# Patient Record
Sex: Male | Born: 1960 | Race: White | Hispanic: No | Marital: Married | Smoking: Never smoker
Health system: Southern US, Community
[De-identification: ages and names within clinical notes are randomized; demographics above are authoritative.]

## PROBLEM LIST (undated history)

## (undated) DIAGNOSIS — I77810 Thoracic aortic ectasia: Secondary | ICD-10-CM

## (undated) DIAGNOSIS — I219 Acute myocardial infarction, unspecified: Secondary | ICD-10-CM

## (undated) DIAGNOSIS — Z8616 Personal history of COVID-19: Secondary | ICD-10-CM

## (undated) DIAGNOSIS — I469 Cardiac arrest, cause unspecified: Secondary | ICD-10-CM

## (undated) DIAGNOSIS — I2510 Atherosclerotic heart disease of native coronary artery without angina pectoris: Secondary | ICD-10-CM

## (undated) DIAGNOSIS — I10 Essential (primary) hypertension: Secondary | ICD-10-CM

## (undated) DIAGNOSIS — E785 Hyperlipidemia, unspecified: Secondary | ICD-10-CM

## (undated) DIAGNOSIS — I255 Ischemic cardiomyopathy: Secondary | ICD-10-CM

## (undated) DIAGNOSIS — I4901 Ventricular fibrillation: Secondary | ICD-10-CM

## (undated) DIAGNOSIS — E119 Type 2 diabetes mellitus without complications: Secondary | ICD-10-CM

## (undated) HISTORY — DX: Personal history of COVID-19: Z86.16

## (undated) HISTORY — DX: Type 2 diabetes mellitus without complications: E11.9

## (undated) HISTORY — DX: Essential (primary) hypertension: I10

## (undated) HISTORY — DX: Thoracic aortic ectasia: I77.810

## (undated) HISTORY — PX: APPENDECTOMY: SHX54

## (undated) HISTORY — DX: Hyperlipidemia, unspecified: E78.5

## (undated) HISTORY — PX: CARDIAC CATHETERIZATION: SHX172

---

## 2012-07-08 ENCOUNTER — Inpatient Hospital Stay (HOSPITAL_COMMUNITY): Payer: Medicaid Other

## 2012-07-08 ENCOUNTER — Encounter (HOSPITAL_COMMUNITY)
Admission: EM | Disposition: A | Discharge status: Home / Self Care | Payer: Self-pay | Source: Home / Self Care | Attending: Cardiovascular Disease

## 2012-07-08 ENCOUNTER — Inpatient Hospital Stay (HOSPITAL_COMMUNITY)
Admission: EM | Admit: 2012-07-08 | Discharge: 2012-07-12 | DRG: 246 | Disposition: A | Discharge status: Home / Self Care | Payer: Medicaid Other | Attending: Cardiovascular Disease | Admitting: Cardiovascular Disease

## 2012-07-08 ENCOUNTER — Encounter (HOSPITAL_COMMUNITY): Payer: Self-pay | Admitting: Physician Assistant

## 2012-07-08 ENCOUNTER — Ambulatory Visit (HOSPITAL_COMMUNITY): Admit: 2012-07-08 | Payer: Self-pay | Admitting: Cardiovascular Disease

## 2012-07-08 ENCOUNTER — Emergency Department (HOSPITAL_COMMUNITY): Payer: Medicaid Other

## 2012-07-08 DIAGNOSIS — I2589 Other forms of chronic ischemic heart disease: Secondary | ICD-10-CM | POA: Diagnosis present

## 2012-07-08 DIAGNOSIS — I4901 Ventricular fibrillation: Secondary | ICD-10-CM

## 2012-07-08 DIAGNOSIS — I219 Acute myocardial infarction, unspecified: Secondary | ICD-10-CM

## 2012-07-08 DIAGNOSIS — I4729 Other ventricular tachycardia: Secondary | ICD-10-CM | POA: Diagnosis present

## 2012-07-08 DIAGNOSIS — I2109 ST elevation (STEMI) myocardial infarction involving other coronary artery of anterior wall: Secondary | ICD-10-CM

## 2012-07-08 DIAGNOSIS — Z8249 Family history of ischemic heart disease and other diseases of the circulatory system: Secondary | ICD-10-CM

## 2012-07-08 DIAGNOSIS — I2582 Chronic total occlusion of coronary artery: Secondary | ICD-10-CM | POA: Diagnosis present

## 2012-07-08 DIAGNOSIS — I255 Ischemic cardiomyopathy: Secondary | ICD-10-CM

## 2012-07-08 DIAGNOSIS — E785 Hyperlipidemia, unspecified: Secondary | ICD-10-CM | POA: Diagnosis present

## 2012-07-08 DIAGNOSIS — I472 Ventricular tachycardia, unspecified: Secondary | ICD-10-CM | POA: Diagnosis present

## 2012-07-08 DIAGNOSIS — Z955 Presence of coronary angioplasty implant and graft: Secondary | ICD-10-CM

## 2012-07-08 DIAGNOSIS — I2510 Atherosclerotic heart disease of native coronary artery without angina pectoris: Secondary | ICD-10-CM | POA: Diagnosis present

## 2012-07-08 DIAGNOSIS — Z9089 Acquired absence of other organs: Secondary | ICD-10-CM

## 2012-07-08 DIAGNOSIS — R031 Nonspecific low blood-pressure reading: Secondary | ICD-10-CM | POA: Diagnosis present

## 2012-07-08 DIAGNOSIS — I25119 Atherosclerotic heart disease of native coronary artery with unspecified angina pectoris: Secondary | ICD-10-CM

## 2012-07-08 HISTORY — DX: Atherosclerotic heart disease of native coronary artery without angina pectoris: I25.10

## 2012-07-08 HISTORY — PX: PERCUTANEOUS CORONARY STENT INTERVENTION (PCI-S): SHX5485

## 2012-07-08 HISTORY — DX: Ischemic cardiomyopathy: I25.5

## 2012-07-08 HISTORY — PX: LEFT HEART CATHETERIZATION WITH CORONARY ANGIOGRAM: SHX5451

## 2012-07-08 HISTORY — DX: Ventricular fibrillation: I49.01

## 2012-07-08 LAB — BASIC METABOLIC PANEL
BUN: 14 mg/dL (ref 6–23)
CO2: 19 mEq/L (ref 19–32)
Calcium: 9.4 mg/dL (ref 8.4–10.5)
Chloride: 104 mEq/L (ref 96–112)
Creatinine, Ser: 0.94 mg/dL (ref 0.50–1.35)
GFR calc Af Amer: >90 mL/min (ref >90)
GFR calc non Af Amer: >90 mL/min (ref >90)
Glucose, Bld: 234 mg/dL — ABNORMAL HIGH (ref 70–99)
Potassium: 3.1 mEq/L — ABNORMAL LOW (ref 3.5–5.1)
Sodium: 142 mEq/L (ref 135–145)

## 2012-07-08 LAB — POCT I-STAT, CHEM 8
Calcium, Ion: 1.12 mmol/L (ref 1.12–1.23)
HCT: 43 % (ref 39.0–52.0)
TCO2: 18 mmol/L (ref 0–100)

## 2012-07-08 LAB — DIFFERENTIAL
Basophils Absolute: 0 10*3/uL (ref 0.0–0.1)
Basophils Relative: 0 % (ref 0–1)
Eosinophils Absolute: 0.1 10*3/uL (ref 0.0–0.7)
Eosinophils Relative: 0 % (ref 0–5)
Lymphocytes Relative: 15 % (ref 12–46)
Lymphs Abs: 2.5 10*3/uL (ref 0.7–4.0)
Monocytes Absolute: 1.1 10*3/uL — ABNORMAL HIGH (ref 0.1–1.0)
Monocytes Relative: 6 % (ref 3–12)
Neutro Abs: 12.9 10*3/uL — ABNORMAL HIGH (ref 1.7–7.7)
Neutrophils Relative %: 78 % — ABNORMAL HIGH (ref 43–77)

## 2012-07-08 LAB — CBC
HCT: 41.6 % (ref 39.0–52.0)
Hemoglobin: 14.1 g/dL (ref 13.0–17.0)
MCH: 27.3 pg (ref 26.0–34.0)
MCHC: 33.9 g/dL (ref 30.0–36.0)
MCV: 80.5 fL (ref 78.0–100.0)
Platelets: 309 10*3/uL (ref 150–400)
RBC: 5.17 MIL/uL (ref 4.22–5.81)
RDW: 13.9 % (ref 11.5–15.5)
WBC: 16.5 10*3/uL — ABNORMAL HIGH (ref 4.0–10.5)

## 2012-07-08 LAB — PROTIME-INR
INR: 1.02 (ref 0.00–1.49)
Prothrombin Time: 13.6 seconds (ref 11.6–15.2)

## 2012-07-08 LAB — APTT: aPTT: 26 seconds (ref 24–37)

## 2012-07-08 LAB — POCT I-STAT TROPONIN I: Troponin i, poc: 0.04 ng/mL (ref 0.00–0.08)

## 2012-07-08 SURGERY — LEFT HEART CATHETERIZATION WITH CORONARY ANGIOGRAM
Anesthesia: LOCAL

## 2012-07-08 MED ORDER — SODIUM CHLORIDE 0.9 % IJ SOLN
3.0000 mL | Freq: Two times a day (BID) | INTRAMUSCULAR | Status: DC
  Administered 2012-07-08 – 2012-07-11 (×6): 3 mL via INTRAVENOUS
  Administered 2012-07-11: 10 mL via INTRAVENOUS
  Administered 2012-07-11: 3 mL via INTRAVENOUS

## 2012-07-08 MED ORDER — SODIUM CHLORIDE 0.9 % IV SOLN
0.2500 mg/kg/h | INTRAVENOUS | Status: AC
  Filled 2012-07-08: qty 250

## 2012-07-08 MED ORDER — AMIODARONE HCL IN DEXTROSE 360-4.14 MG/200ML-% IV SOLN: 60.0000 mg/h | INTRAVENOUS | Status: DC

## 2012-07-08 MED ORDER — HEPARIN SODIUM (PORCINE) 5000 UNIT/ML IJ SOLN
4000.0000 [IU] | Freq: Once | INTRAMUSCULAR | Status: AC
  Administered 2012-07-08: 4000 [IU] via INTRAVENOUS

## 2012-07-08 MED ORDER — INSULIN ASPART 100 UNIT/ML ~~LOC~~ SOLN: 0.0000 [IU] | Freq: Every day | SUBCUTANEOUS | Status: DC

## 2012-07-08 MED ORDER — ACETAMINOPHEN 325 MG PO TABS: 650.0000 mg | ORAL_TABLET | ORAL | Status: DC | PRN

## 2012-07-08 MED ORDER — SODIUM CHLORIDE 0.9 % IV SOLN
INTRAVENOUS | Status: AC
  Administered 2012-07-08: 23:00:00 via INTRAVENOUS

## 2012-07-08 MED ORDER — GUAIFENESIN-DM 100-10 MG/5ML PO SYRP
5.0000 mL | ORAL_SOLUTION | ORAL | Status: DC | PRN
  Administered 2012-07-08: 5 mL via ORAL
  Filled 2012-07-08: qty 5

## 2012-07-08 MED ORDER — INSULIN ASPART 100 UNIT/ML ~~LOC~~ SOLN
0.0000 [IU] | Freq: Three times a day (TID) | SUBCUTANEOUS | Status: DC
  Administered 2012-07-09 – 2012-07-10 (×3): 2 [IU] via SUBCUTANEOUS
  Administered 2012-07-10: 3 [IU] via SUBCUTANEOUS

## 2012-07-08 MED ORDER — SODIUM CHLORIDE 0.9 % IJ SOLN: 3.0000 mL | INTRAMUSCULAR | Status: DC | PRN

## 2012-07-08 MED ORDER — ZOLPIDEM TARTRATE 5 MG PO TABS: 5.0000 mg | ORAL_TABLET | Freq: Every evening | ORAL | Status: DC | PRN

## 2012-07-08 MED ORDER — FENTANYL CITRATE 0.05 MG/ML IJ SOLN
INTRAMUSCULAR | Status: AC
  Filled 2012-07-08: qty 2

## 2012-07-08 MED ORDER — MORPHINE SULFATE 2 MG/ML IJ SOLN: 2.0000 mg | INTRAMUSCULAR | Status: DC | PRN

## 2012-07-08 MED ORDER — ASPIRIN 81 MG PO CHEW
81.0000 mg | CHEWABLE_TABLET | Freq: Every day | ORAL | Status: DC
  Administered 2012-07-09 – 2012-07-12 (×4): 81 mg via ORAL
  Filled 2012-07-08 (×4): qty 1

## 2012-07-08 MED ORDER — CLOPIDOGREL BISULFATE 300 MG PO TABS
600.0000 mg | ORAL_TABLET | Freq: Once | ORAL | Status: DC
  Filled 2012-07-08: qty 2

## 2012-07-08 MED ORDER — ONDANSETRON HCL 4 MG/2ML IJ SOLN
4.0000 mg | Freq: Four times a day (QID) | INTRAMUSCULAR | Status: DC | PRN
  Administered 2012-07-09: 4 mg via INTRAVENOUS
  Filled 2012-07-08: qty 2

## 2012-07-08 MED ORDER — ONDANSETRON HCL 4 MG/2ML IJ SOLN: 4.0000 mg | Freq: Four times a day (QID) | INTRAMUSCULAR | Status: DC | PRN

## 2012-07-08 MED ORDER — ATORVASTATIN CALCIUM 80 MG PO TABS: 80.0000 mg | ORAL_TABLET | Freq: Every day | ORAL | Status: DC

## 2012-07-08 MED ORDER — PRASUGREL HCL 10 MG PO TABS
ORAL_TABLET | ORAL | Status: AC
  Administered 2012-07-09: 10 mg via ORAL
  Filled 2012-07-08: qty 6

## 2012-07-08 MED ORDER — ONDANSETRON HCL 4 MG/2ML IJ SOLN
INTRAMUSCULAR | Status: AC
  Administered 2012-07-09: 4 mg via INTRAVENOUS
  Filled 2012-07-08: qty 2

## 2012-07-08 MED ORDER — NITROGLYCERIN 0.4 MG SL SUBL
0.4000 mg | SUBLINGUAL_TABLET | SUBLINGUAL | Status: DC | PRN
  Administered 2012-07-08: 0.4 mg via SUBLINGUAL

## 2012-07-08 MED ORDER — MORPHINE SULFATE 4 MG/ML IJ SOLN
6.0000 mg | Freq: Once | INTRAMUSCULAR | Status: DC
  Filled 2012-07-08: qty 2

## 2012-07-08 MED ORDER — MIDAZOLAM HCL 2 MG/2ML IJ SOLN
INTRAMUSCULAR | Status: AC
  Filled 2012-07-08: qty 2

## 2012-07-08 MED ORDER — AMIODARONE LOAD VIA INFUSION
150.0000 mg | Freq: Once | INTRAVENOUS | Status: DC
  Filled 2012-07-08: qty 83.34

## 2012-07-08 MED ORDER — CARVEDILOL 3.125 MG PO TABS
3.1250 mg | ORAL_TABLET | Freq: Two times a day (BID) | ORAL | Status: DC
  Filled 2012-07-08 (×3): qty 1

## 2012-07-08 MED ORDER — NITROGLYCERIN 0.4 MG SL SUBL: 0.4000 mg | SUBLINGUAL_TABLET | SUBLINGUAL | Status: DC | PRN

## 2012-07-08 MED ORDER — SODIUM CHLORIDE 0.9 % IV SOLN: 250.0000 mL | INTRAVENOUS | Status: DC | PRN

## 2012-07-08 MED ORDER — ONDANSETRON HCL 4 MG/2ML IJ SOLN
4.0000 mg | Freq: Once | INTRAMUSCULAR | Status: DC
  Filled 2012-07-08: qty 2

## 2012-07-08 MED ORDER — PRASUGREL HCL 10 MG PO TABS
10.0000 mg | ORAL_TABLET | Freq: Every day | ORAL | Status: DC
  Administered 2012-07-09 – 2012-07-12 (×4): 10 mg via ORAL
  Filled 2012-07-08 (×4): qty 1

## 2012-07-08 MED ORDER — BIVALIRUDIN 250 MG IV SOLR
INTRAVENOUS | Status: AC
  Filled 2012-07-08: qty 250

## 2012-07-08 MED ORDER — HEPARIN (PORCINE) IN NACL 100-0.45 UNIT/ML-% IJ SOLN
10.0000 [IU]/kg/h | Freq: Once | INTRAMUSCULAR | Status: DC
  Filled 2012-07-08: qty 250

## 2012-07-08 MED ORDER — ALPRAZOLAM 0.25 MG PO TABS
0.2500 mg | ORAL_TABLET | Freq: Two times a day (BID) | ORAL | Status: DC | PRN
  Administered 2012-07-09: 0.25 mg via ORAL
  Filled 2012-07-08: qty 1

## 2012-07-08 MED ORDER — BIVALIRUDIN 250 MG IV SOLR
1.7500 mg/kg/h | INTRAVENOUS | Status: DC
  Administered 2012-07-08: 1.75 mg/kg/h via INTRAVENOUS
  Filled 2012-07-08: qty 250

## 2012-07-08 MED ORDER — AMIODARONE HCL IN DEXTROSE 360-4.14 MG/200ML-% IV SOLN
30.0000 mg/h | INTRAVENOUS | Status: DC
  Filled 2012-07-08: qty 200

## 2012-07-08 MED ORDER — ATORVASTATIN CALCIUM 80 MG PO TABS
80.0000 mg | ORAL_TABLET | Freq: Every day | ORAL | Status: DC
  Administered 2012-07-09 – 2012-07-12 (×4): 80 mg via ORAL
  Filled 2012-07-08 (×4): qty 1

## 2012-07-08 MED ORDER — OXYCODONE-ACETAMINOPHEN 5-325 MG PO TABS
1.0000 | ORAL_TABLET | ORAL | Status: DC | PRN
  Administered 2012-07-09 (×4): 1 via ORAL
  Filled 2012-07-08 (×2): qty 1
  Filled 2012-07-08: qty 2
  Filled 2012-07-08 (×2): qty 1

## 2012-07-08 NOTE — Progress Notes (Signed)
Pt came in as code stemi.  As pt was taken to cath lab I located family and took them to 2nd flr waiting area and stayed with them during the procedure.  As we waited, wife was tearful. I checked on progress a couple times. While waiting, the family wanted prayer. The dr spoke with family and I took them to 2900 where pt was placed in room. I accompanied wife on first visit after pt's procedure. Family was very appreciative and would appreciate a follow-up visit.  Marjory Lies, Chaplain July 08, 2012

## 2012-07-08 NOTE — ED Notes (Signed)
EKG shown to Dr. Juleen China, copies in the chart

## 2012-07-08 NOTE — ED Notes (Signed)
ntg sl given, (denies: ED viagra like meds). sBP 117. Pain 8/10.

## 2012-07-08 NOTE — CV Procedure (Signed)
Cardiac Catheterization Procedure Note  Name: Curtis Rosales MRN: [id number] DOB: 1961-10-29  Procedure: Left Heart Cath, Selective Coronary Angiography, LV angiography,  PTCA/Stent of the proximal LAD and PTCA of the first diagonal  Indication: Anterior MI complicated by VF arrest   Diagnostic Procedure Details: The right groin was prepped, draped, and anesthetized with 1% lidocaine. Using the modified Seldinger technique, a 6 French sheath was introduced into the right femoral artery. Standard Judkins catheters were used for selective coronary angiography. The right coronary artery was imaged with a JR 4 diagnostic catheter, the left coronary artery was imaged with a XB LAD 3.5 cm guide catheter. Catheter exchanges were performed over a wire.  The diagnostic procedure was well-tolerated without immediate complications.  PROCEDURAL FINDINGS Hemodynamics: AO 83/59 LV 80/13  Coronary angiography: Coronary dominance: right  Left mainstem:  widely patent with minimal irregularity.  Left anterior descending (LAD):  total occlusion of the proximal vessel. After re\re opening the LAD, there is critical stenosis involving the LAD first diagonal bifurcation. The vessel wraps around the left ventricular apex. Before reperfusion, there was TIMI 0 flow.  Left circumflex (LCx):  there is a moderate caliber intermediate without significant stenosis. The AV groove circumflex has diffuse 30% proximal stenosis. This supplies a very large obtuse marginal branch without significant stenosis.  Right coronary artery (RCA):  the right coronary artery is a large, dominant vessel. The vessel supplies the PDA and posterolateral branch. There is no significant disease throughout the right coronary artery distribution.  Left ventriculography: Left ventricular systolic function is severely depressed. There is akinesis of the entire anterolateral and apical walls. The left jugular ejection fraction is estimated at 30%.      PCI Procedure Note:  Following the diagnostic procedure, the decision was made to proceed with PCI.  the patient had received 4000 units of unfractionated heparin. Angiomax was started for anticoagulation.  Weight-based bivalirudin was given for anticoagulation. Once a therapeutic ACT was achieved, a 6 [place]  XB LAD 3.5 cm guide catheter was inserted.  A cougar coronary guidewire was used to cross the lesion into the diagonal.   a second wire was placed across the lesion into the LAD. Aspiration thrombectomy was performed over the LAD wire. This restored TIMI-3 flow. The patient had reperfusion arrhythmia with accelerated idioventricular rhythm. The diagonal wire was left in place and the LAD was stented across the diagonal using a 3.0 by 16mm Promus Element drug-eluting stent. The stent was postdilated with a 3.25x12 mm noncompliant balloon to 16 atm.  Following PCI, there was 0% residual stenosis and TIMI-3 flow.  the diagonal ostium was significantly compromised with 95% stenosis. The vessel was rewired using a whisper wire. The ostium of the diagonal was then dilated with a 2.5 x 15 mm balloon. Final kissing balloon angioplasty was then done with a 3.25 mm noncompliant balloon in the LAD and a 2.5 mm compliant balloon in the diagonal. The LAD balloon was dilated to 10 atmospheres the diagonal balloon was dilated to 8 atmospheres.  Final angiography confirmed an excellent result. Femoral hemostasis was achieved with a Perclose device.  The patient tolerated the PCI procedure well. There were no immediate procedural complications.  The patient was transferred to the post catheterization recovery area for further monitoring. he was given effient 60 mg on the table.  PCI Data: Vessel -  LAD/Segment -  proximal Percent Stenosis (pre)  100 TIMI-flow  0 Stent  3.0 x 16 mm drug-eluting Percent Stenosis (post)  0  TIMI-flow (post)  3  Sidebranch vessel: First diagonal Percent stenosis 99 TIMI flow  3 Device: 2.5 mm balloon Post stenosis 70 TIMI flow 3  Final Conclusions:   1. 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 Severe left ventricular systolic dysfunction consistent with the acute anterior MI 4. Successful bifurcation PCI of the LAD/diagonal with main branch stenting in sidebranch balloon angioplasty  Recommendations:  post MI medical therapy. Aspirin and effient at least 12 months. Reassessment of left ventricular function by echocardiogram Monday and consideration of a life vest left ventricular ejection fraction remains less than 35%. I think the patient has a good chance of LV recovery as he presented early in the course of his infarction.  [name] 07/08/2012, 9:43 PM

## 2012-07-08 NOTE — ED Notes (Signed)
To CL now.

## 2012-07-08 NOTE — ED Provider Notes (Signed)
History     CSN: [id number]  Arrival date & time 07/08/12  2006   First MD Initiated Contact with Patient 07/08/12 2012      Chief Complaint  Patient presents with  . Code STEMI    (Consider location/radiation/quality/duration/timing/severity/associated sxs/prior treatment) Patient is a 51 y.o. male presenting with chest pain. The history is provided by the patient.  Chest Pain The chest pain began less than 1 hour ago. Chest pain occurs constantly. The chest pain is unchanged. Associated with: unknown. At its most intense, the pain is at 10/10. The pain is currently at 8/10. The severity of the pain is moderate. The quality of the pain is described as aching. The pain does not radiate. Exacerbated by: unknown. Pertinent negatives for primary symptoms include no fever, no shortness of breath, no cough, no abdominal pain, no nausea and no vomiting.  Pertinent negatives for associated symptoms include no numbness. Risk factors include no known risk factors.     Past Medical History  Diagnosis Date  . No pertinent past medical history     Past Surgical History  Procedure Date  . Appendectomy     History reviewed. No pertinent family history.  History  Substance Use Topics  . Smoking status: Never Smoker   . Smokeless tobacco: Never Used  . Alcohol Use: No      Review of Systems  Constitutional: Negative for fever.  HENT: Negative for rhinorrhea, drooling and neck pain.   Eyes: Negative for pain.  Respiratory: Negative for cough and shortness of breath.   Cardiovascular: Positive for chest pain. Negative for leg swelling.  Gastrointestinal: Negative for nausea, vomiting, abdominal pain and diarrhea.  Genitourinary: Negative for dysuria and hematuria.  Musculoskeletal: Negative for gait problem.  Skin: Negative for color change.  Neurological: Negative for numbness and headaches.  Hematological: Negative for adenopathy.  Psychiatric/Behavioral: Negative for behavioral  problems.  All other systems reviewed and are negative.    Allergies  Review of patient's allergies indicates no known allergies.  Home Medications  No current outpatient prescriptions on file.  BP 116/81  Pulse 93  Temp 97.8 F (36.6 C) (Oral)  Resp 22  Ht 5\' 11"  (1.803 m)  Wt 187 lb 9.8 oz (85.1 kg)  BMI 26.17 kg/m2  SpO2 100%  Physical Exam  Nursing note and vitals reviewed. Constitutional: He is oriented to person, place, and time. He appears well-developed and well-nourished.  HENT:  Head: Normocephalic and atraumatic.  Right Ear: External ear normal.  Left Ear: External ear normal.  Nose: Nose normal.  Mouth/Throat: Oropharynx is clear and moist. No oropharyngeal exudate.  Eyes: Conjunctivae and EOM are normal. Pupils are equal, round, and reactive to light.  Neck: Normal range of motion. Neck supple.  Cardiovascular: Normal rate, regular rhythm, normal heart sounds and intact distal pulses.  Exam reveals no gallop and no friction rub.   No murmur heard. Pulmonary/Chest: Effort normal and breath sounds normal. No respiratory distress. He has no wheezes.  Abdominal: Soft. Bowel sounds are normal. He exhibits no distension. There is no tenderness. There is no rebound and no guarding.  Musculoskeletal: Normal range of motion. He exhibits no edema and no tenderness.  Neurological: He is alert and oriented to person, place, and time.  Skin: Skin is warm and dry.  Psychiatric: He has a normal mood and affect. His behavior is normal.    ED Course  Procedures (including critical care time)  Labs Reviewed  CBC - Abnormal; Notable for  the following:    WBC 16.5 (*)     All other components within normal limits  DIFFERENTIAL - Abnormal; Notable for the following:    Neutrophils Relative 78 (*)     Neutro Abs 12.9 (*)     Monocytes Absolute 1.1 (*)     All other components within normal limits  BASIC METABOLIC PANEL - Abnormal; Notable for the following:    Potassium  3.1 (*)     Glucose, Bld 234 (*)     All other components within normal limits  POCT I-STAT, CHEM 8 - Abnormal; Notable for the following:    Potassium 3.2 (*)     Glucose, Bld 229 (*)     All other components within normal limits  CBC WITH DIFFERENTIAL - Abnormal; Notable for the following:    WBC 18.4 (*)     Neutrophils Relative 87 (*)     Neutro Abs 16.0 (*)     Lymphocytes Relative 7 (*)     Monocytes Absolute 1.1 (*)     All other components within normal limits  COMPREHENSIVE METABOLIC PANEL - Abnormal; Notable for the following:    Glucose, Bld 114 (*)     AST 66 (*)     All other components within normal limits  TROPONIN I - Abnormal; Notable for the following:    Troponin I 11.00 (*)     All other components within normal limits  GLUCOSE, CAPILLARY - Abnormal; Notable for the following:    Glucose-Capillary 109 (*)     All other components within normal limits  PROTIME-INR  APTT  POCT I-STAT TROPONIN I  PRO B NATRIURETIC PEPTIDE  CBC  TROPONIN I  TROPONIN I  TSH  HEMOGLOBIN A1C  LIPID PANEL  COMPREHENSIVE METABOLIC PANEL  TROPONIN I  TROPONIN I  LIPID PANEL  MRSA PCR SCREENING   Portable Chest X-ray 1 View  07/08/2012  *RADIOLOGY REPORT*  Clinical Data: Anterior emboli.  PORTABLE CHEST - 1 VIEW  Comparison: None.  Findings: Shallow inspiration.  Normal heart size and pulmonary vascularity.  Slightly increased opacity in the right upper lung may represent infiltration or atelectasis.  No blunting of costophrenic angles.  No pneumothorax.  Mediastinal contours appear intact.  There is a linear structure projected over the left chest which may represent a PICC line.  If so, this is an atypical position. If no PICC line has been placed, and this may represent EKG lead.  Clinical correlation recommended.  IMPRESSION: Mild increased density in the right upper lung may represent infiltration or atelectasis.   Original Report Authenticated By: Marlon Pel, M.D.       No diagnosis found.   Date: 07/08/2012  Rate: 100  Rhythm: sinus tachycardia  QRS Axis: normal  Intervals: QT prolonged  ST/T Wave abnormalities: nonspecific ST/T changes  Conduction Disutrbances:none  Narrative Interpretation: Mild ST dep in inferior leads  Old EKG Reviewed: none available    MDM  1:31 AM 51 y.o. male pw left shoulder pain that began pta. 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 He then developed intense left shoulder pain. EMS was called and note that the pt went into vfib arrest en route, he got defibrillated x 1 and then 150mg  amiodarone. Pt was less responsive for 1-2 min and then regained full consciousness. Pt AFVSS here, he denies any medical problems, non-smoker. Will get labs, amio gtt, heparin gtt. He got ASA and nitro en route. Pt  will go to cath lab.   Clinical Impression Chest pain        Purvis Sheffield, MD 07/09/12 0131

## 2012-07-08 NOTE — ED Notes (Signed)
Remains alert, NAD, calm, interactive. Drs cooper and hochrein at [facility].

## 2012-07-08 NOTE — H&P (Signed)
History and Physical   Patient ID: Curtis Rosales MRN: [id number], DOB/AGE: [birth year] 51 y.o. Date of Encounter: 07/08/2012  Primary Physician: Can't remember the name Primary Cardiologist: None  Chief Complaint:  Acute MI/V. fib arrest  HPI: Curtis Rosales is a 51 year old male with no previous cardiac history. 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 He developed left shoulder pain and some left chest pain. He reached a 9 or 10/10. It was associated with diaphoresis and nausea. There was also slight shortness of breath. EMS was called. In route, they gave him aspirin 81 mg x4. In route, he had a V. fib arrest treated with one shock. He did not require CPR or any other medications. After the arrest, he had projectile vomiting with clear fluid. He continued to complain of left shoulder and left chest pain. In the emergency room, he was pale and complaining of ongoing left chest and shoulder pain. His ECG was abnormal. The cath lab team was called in and he was taken to the Cath Lab. He has never had pain like this before. He has no history of exertional chest pain.  PMH: none known  Surgical History:   Past Surgical History  Procedure Date  . Appendectomy     I have reviewed the patient's current medications. Prior to Admission medications   Can't remember     Scheduled Meds:   . amiodarone  150 mg Intravenous Once  . clopidogrel  600 mg Oral Once  . heparin  10 Units/kg/hr Intravenous Once  . heparin  4,000 Units Intravenous Once  .  morphine injection  6 mg Intravenous Once  . ondansetron (ZOFRAN) IV  4 mg Intravenous Once   Continuous Infusions:   . amiodarone (NEXTERONE PREMIX) 360 mg/200 mL dextrose     And  . amiodarone (NEXTERONE PREMIX) 360 mg/200 mL dextrose     Allergies: No known drug allergies    History   Social History  . Marital Status: N/A    Spouse Name: N/A    Number of Children: N/A  . Years of Education: N/A   Occupational History  . Psychologist, prison and probation services    Social History Main Topics  . Smoking status: Never Smoker   . Smokeless tobacco: Never Used  . Alcohol Use: No  . Drug Use: No  . Sexually Active: Not on file   Other Topics Concern  . Not on file   Social History Narrative   He is not able to remember any of his past medical history, other than  He had an appendectomy. He cannot remember any medicines he takes or any conditions for which he receives treatment. He also cannot remember the name of his physician. Patient lives with wife in [place] and drives racecars for a hobby.   Family Status  Relation Status Death Age  . Father Deceased 60    MI  . Mother Deceased     No CAD   Review of Systems:   Full 14-point review of systems otherwise negative except as noted above.   Physical Exam: Blood pressure 108/87, pulse 97, temperature 97.8 F (36.6 C), temperature source Oral, resp. rate 22, SpO2 94.00%. General: Well developed, well nourished, male in no acute distress. Head: Normocephalic, atraumatic, sclera non-icteric, no xanthomas, nares are without discharge. Dentition: good  Neck: No carotid bruits. JVD not elevated. No thyromegally Lungs: Good expansion bilaterally. without wheezes or rhonchi. Few rales Heart: Regular rate and rhythm with S1  S2.  No S3 or S4.  No murmur, no rubs, or gallops appreciated. Abdomen: Soft, non-tender, non-distended with normoactive bowel sounds. No hepatomegaly. No rebound/guarding. No obvious abdominal masses. Msk:  Strength and tone appear normal for age. No joint deformities or effusions, no spine or costo-vertebral angle tenderness. Extremities: No clubbing or cyanosis. No edema.  Distal pedal pulses are 2+ in 4 extrem Neuro: Alert and oriented X 3. Moves all extremities spontaneously. No focal deficits noted. Psych:  Responds to questions appropriately with a normal affect. Skin: No rashes or lesions noted; he is pale and diaphoretic  Labs: pending  Radiology/Studies:    Pending  ECG: 08-Jul-2012 20:09:05  SINUS TACHYCARDIA ~ V-rate> 99 CONSIDER ANTEROSEPTAL INFARCT ~ Q >49mS, V1 V2 MINIMAL ST DEPRESSION, INFERIOR LEADS ~ ST <-0.50mV, II III aVF Vent. rate 100 BPM PR interval 140 ms QRS duration 104 ms QT/QTc 384/495 ms P-R-T axes 68 57 13  ASSESSMENT AND PLAN:  Principal Problem:  *Acute MI, initial - Curtis Rosales is being taken directly to the cath lab. Further evaluation and treatment depends on the results. He'll be screened for cardiac risk factors.  Active Problems:  Ventricular fibrillation - he had one episode documented by EMS that resolved with one shock. Amiodarone was ordered but never initiated. M.D. advised on medications.    Signed,  Bjorn Loser Barrett PA-C 07/08/2012, 8:31 PM   History and all data above reviewed.  Patient examined.  I agree with the findings as above.  The patient was evaluated briefly in the ER after chest pain and Vfib arrest.  He had subtle anterior ST elevation and peaked T waves. The patient exam reveals COR:RRR  ,  Lungs: Clear  ,  Abd: Positive bowel sounds, no rebound no guarding, Ext No edeam  .  All available labs, radiology testing, previous records reviewed. Agree with documented assessment and plan. He was taken urgently and immediately to the cath lab by Dr. Larrie Kass Barbaraann Avans  10:02 PM  07/08/2012

## 2012-07-08 NOTE — ED Notes (Signed)
Pt arrives by EMS, arrives as STEMI with shcock for vfib. Arrives alert, NAD, calm, interactive, 10/10 CP. 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 Given 150 of amiodarone.  Vomited x1 after being shocked. cbg "good", BP "good", 143/99, pt restless, given another ntg sl PTA.  EDP in room on arrival with RRT RN.

## 2012-07-08 NOTE — ED Notes (Signed)
Dr Juleen China EDP in room at [facility] on arrival.

## 2012-07-08 NOTE — ED Notes (Signed)
Card PA at BS ?

## 2012-07-09 DIAGNOSIS — I4901 Ventricular fibrillation: Secondary | ICD-10-CM

## 2012-07-09 DIAGNOSIS — I219 Acute myocardial infarction, unspecified: Secondary | ICD-10-CM

## 2012-07-09 LAB — CBC WITH DIFFERENTIAL/PLATELET
Basophils Absolute: 0 10*3/uL (ref 0.0–0.1)
Basophils Relative: 0 % (ref 0–1)
Eosinophils Absolute: 0 10*3/uL (ref 0.0–0.7)
Eosinophils Relative: 0 % (ref 0–5)
Lymphs Abs: 1.2 10*3/uL (ref 0.7–4.0)
MCH: 27.7 pg (ref 26.0–34.0)
Neutrophils Relative %: 87 % — ABNORMAL HIGH (ref 43–77)
Platelets: 274 10*3/uL (ref 150–400)
RBC: 5.02 MIL/uL (ref 4.22–5.81)
RDW: 14.2 % (ref 11.5–15.5)

## 2012-07-09 LAB — TROPONIN I: Troponin I: >20 ng/mL (ref <0.30)

## 2012-07-09 LAB — GLUCOSE, CAPILLARY
Glucose-Capillary: 145 mg/dL — ABNORMAL HIGH (ref 70–99)
Glucose-Capillary: 111 mg/dL — ABNORMAL HIGH (ref 70–99)
Glucose-Capillary: 146 mg/dL — ABNORMAL HIGH (ref 70–99)

## 2012-07-09 LAB — COMPREHENSIVE METABOLIC PANEL
ALT: 26 U/L (ref 0–53)
ALT: 32 U/L (ref 0–53)
AST: 130 U/L — ABNORMAL HIGH (ref 0–37)
AST: 66 U/L — ABNORMAL HIGH (ref 0–37)
Albumin: 3.7 g/dL (ref 3.5–5.2)
Albumin: 3.8 g/dL (ref 3.5–5.2)
Alkaline Phosphatase: 69 U/L (ref 39–117)
BUN: 13 mg/dL (ref 6–23)
CO2: 24 mEq/L (ref 19–32)
Calcium: 9.5 mg/dL (ref 8.4–10.5)
Chloride: 105 mEq/L (ref 96–112)
Creatinine, Ser: 0.83 mg/dL (ref 0.50–1.35)
Potassium: 3.7 mEq/L (ref 3.5–5.1)
Sodium: 141 mEq/L (ref 135–145)
Total Bilirubin: 0.6 mg/dL (ref 0.3–1.2)
Total Protein: 6.7 g/dL (ref 6.0–8.3)

## 2012-07-09 LAB — CBC
MCH: 27.5 pg (ref 26.0–34.0)
Platelets: 259 10*3/uL (ref 150–400)
RBC: 4.84 MIL/uL (ref 4.22–5.81)
RDW: 14 % (ref 11.5–15.5)
WBC: 16 10*3/uL — ABNORMAL HIGH (ref 4.0–10.5)

## 2012-07-09 LAB — LIPID PANEL
Cholesterol: 172 mg/dL (ref 0–200)
HDL: 33 mg/dL — ABNORMAL LOW (ref >39)
Total CHOL/HDL Ratio: 5.2 RATIO
Triglycerides: 234 mg/dL — ABNORMAL HIGH (ref <150)

## 2012-07-09 LAB — TSH: TSH: 1.24 u[IU]/mL (ref 0.350–4.500)

## 2012-07-09 LAB — HEMOGLOBIN A1C
Hgb A1c MFr Bld: 5.9 % — ABNORMAL HIGH (ref <5.7)
Mean Plasma Glucose: 123 mg/dL — ABNORMAL HIGH (ref <117)

## 2012-07-09 MED ORDER — MAGNESIUM SULFATE 40 MG/ML IJ SOLN
2.0000 g | Freq: Once | INTRAMUSCULAR | Status: AC
  Administered 2012-07-09: 2 g via INTRAVENOUS
  Filled 2012-07-09: qty 50

## 2012-07-09 MED ORDER — FUROSEMIDE 10 MG/ML IJ SOLN
20.0000 mg | Freq: Once | INTRAMUSCULAR | Status: AC
  Administered 2012-07-09: 20 mg via INTRAVENOUS

## 2012-07-09 MED ORDER — CARVEDILOL 6.25 MG PO TABS
6.2500 mg | ORAL_TABLET | Freq: Two times a day (BID) | ORAL | Status: DC
  Administered 2012-07-09 – 2012-07-10 (×4): 6.25 mg via ORAL
  Filled 2012-07-09 (×6): qty 1

## 2012-07-09 NOTE — Progress Notes (Signed)
Follow-up visit based on referral from [facility]. Visited pt and wife from 8:50-9:05 this evening. They are very encouraged with his progress and expressed appreciation for the visit.

## 2012-07-09 NOTE — Progress Notes (Signed)
SUBJECTIVE: The patient is doing well today.  He reports pain in his chest and back which is gradually improving.  This is a different discomfort from his ischemic pain yesterday.  At this time, he denies shortness of breath, or any new concerns.     Marland Kitchen aspirin  81 mg Oral Daily  . atorvastatin  80 mg Oral q1800  . bivalirudin      . carvedilol  3.125 mg Oral BID WC  . fentaNYL      . furosemide  20 mg Intravenous Once  . heparin  4,000 Units Intravenous Once  . insulin aspart  0-15 Units Subcutaneous TID WC  . insulin aspart  0-5 Units Subcutaneous QHS  . midazolam      . midazolam      .  morphine injection  6 mg Intravenous Once  . ondansetron      . ondansetron (ZOFRAN) IV  4 mg Intravenous Once  . prasugrel      . prasugrel  10 mg Oral Daily  . sodium chloride  3 mL Intravenous Q12H  . DISCONTD: amiodarone  150 mg Intravenous Once  . DISCONTD: atorvastatin  80 mg Oral q1800  . DISCONTD: clopidogrel  600 mg Oral Once  . DISCONTD: heparin  10 Units/kg/hr Intravenous Once      . sodium chloride 10 mL/hr at 07/09/12 0630  . bivalirudin (ANGIOMAX) infusion 5 mg/mL (Cath Lab,ACS,PCI indication) Stopped (07/08/12 2335)  . DISCONTD: amiodarone (NEXTERONE PREMIX) 360 mg/200 mL dextrose    . DISCONTD: amiodarone (NEXTERONE PREMIX) 360 mg/200 mL dextrose    . DISCONTD: bivalirudin (ANGIOMAX) infusion 5 mg/mL (Cath Lab,ACS,PCI indication) 1.75 mg/kg/hr (07/08/12 2114)    OBJECTIVE: Physical Exam: Filed Vitals:   07/09/12 0400 07/09/12 0500 07/09/12 0600 07/09/12 0700  BP: 113/76 111/77 115/78 113/74  Pulse: 88 88 87 87  Temp: 98.9 F (37.2 C)     TempSrc: Oral     Resp: 20 18 27 24   Height:      Weight:  190 lb 11.2 oz (86.5 kg)    SpO2: 93% 92% 91% 89%    Intake/Output Summary (Last 24 hours) at 07/09/12 0816 Last data filed at 07/09/12 0700  Gross per 24 hour  Intake   1313 ml  Output    500 ml  Net    813 ml    Telemetry reveals sinus rhythm, he had episodic  sinus bradycardia as well as accelerated ideoventricular rhythms and NSVT overnight  GEN- The patient is well appearing, alert and oriented x 3 today.   Head- normocephalic, atraumatic Eyes-  R sclera is errythematous, conjunctiva pink Ears- hearing intact Oropharynx- clear Neck- supple, no JVP Lymph- no cervical lymphadenopathy Lungs- Clear to ausculation bilaterally, normal work of breathing Heart- Regular rate and rhythm, no murmurs, rubs or gallops, PMI not laterally displaced GI- soft, NT, ND, + BS Extremities- no clubbing, cyanosis, or edema Skin- no rash or lesion Psych- euthymic mood, full affect Neuro- strength and sensation are intact  LABS: Basic Metabolic Panel:  Basename 07/09/12 0714 07/09/12 0331 07/08/12 2334  NA -- 139 141  K -- 3.7 3.7  CL -- 105 106  CO2 -- 22 24  GLUCOSE -- 139* 114*  BUN -- 13 14  CREATININE -- 0.77 0.83  CALCIUM -- 9.3 9.5  MG 1.8 -- --  PHOS -- -- --   Liver Function Tests:  [facility] 07/09/12 0331 07/08/12 2334  AST 130* 66*  ALT 32 26  ALKPHOS  69 72  BILITOT 0.6 0.5  PROT 6.4 6.7  ALBUMIN 3.7 3.8   No results found for this basename: LIPASE:2,AMYLASE:2 in the last 72 hours CBC:  Basename 07/09/12 0331 07/08/12 2334 07/08/12 2017  WBC 16.0* 18.4* --  NEUTROABS -- 16.0* 12.9*  HGB 13.3 13.9 --  HCT 38.8* 40.5 --  MCV 80.2 80.7 --  PLT 259 274 --   Cardiac Enzymes:  Basename 07/09/12 0331 07/08/12 2344  CKTOTAL -- --  CKMB -- --  CKMBINDEX -- --  TROPONINI >20.00* 11.00*   BNP: No components found with this basename: POCBNP:3 D-Dimer: No results found for this basename: DDIMER:2 in the last 72 hours Hemoglobin A1C: No results found for this basename: HGBA1C in the last 72 hours Fasting Lipid Panel:  Basename 07/09/12 0331  CHOL 172  HDL 33*  LDLCALC 92  TRIG 782*  CHOLHDL 5.2  LDLDIRECT --   Thyroid Function Tests: No results found for this basename: TSH,T4TOTAL,FREET3,T3FREE,THYROIDAB in the last 72  hours Anemia Panel: No results found for this basename: VITAMINB12,FOLATE,FERRITIN,TIBC,IRON,RETICCTPCT in the last 72 hours  RADIOLOGY: Portable Chest X-ray 1 View  07/08/2012  *RADIOLOGY REPORT*  Clinical Data: Anterior emboli.  PORTABLE CHEST - 1 VIEW  Comparison: None.  Findings: Shallow inspiration.  Normal heart size and pulmonary vascularity.  Slightly increased opacity in the right upper lung may represent infiltration or atelectasis.  No blunting of costophrenic angles.  No pneumothorax.  Mediastinal contours appear intact.  There is a linear structure projected over the left chest which may represent a PICC line.  If so, this is an atypical position. If no PICC line has been placed, and this may represent EKG lead.  Clinical correlation recommended.  IMPRESSION: Mild increased density in the right upper lung may represent infiltration or atelectasis.   Original Report Authenticated By: Marlon Pel, M.D.     ASSESSMENT AND PLAN:  Principal Problem:  *Acute MI, initial Active Problems:  Ventricular fibrillation  1. Acute LAD occlusion/ acute MI Doing well s/p PCI emergently by Dr Excell Seltzer Continue ASA and Prasugrel Titrate coreg as able Continue statin Will need echo on Monday to assess EF  2. Reperfusion arrhythmias/ VF arrest in the field Pt with accelerated ideoventricular rhythm and NSVT overnight Clinically stable Will increase coreg as able Keep K>3.9 and Mg >1.9 I agree with Dr Excell Seltzer that LifeVest would be prudent at discharge  Keep in CCU this am.  If arrhythmias subside, he could go to stepdown late this afternoon   Hillis Range, MD 07/09/2012 8:16 AM

## 2012-07-10 LAB — BASIC METABOLIC PANEL
GFR calc Af Amer: >90 mL/min (ref >90)
GFR calc non Af Amer: >90 mL/min (ref >90)
Potassium: 3.6 mEq/L (ref 3.5–5.1)
Sodium: 140 mEq/L (ref 135–145)

## 2012-07-10 LAB — GLUCOSE, CAPILLARY
Glucose-Capillary: 163 mg/dL — ABNORMAL HIGH (ref 70–99)
Glucose-Capillary: 111 mg/dL — ABNORMAL HIGH (ref 70–99)

## 2012-07-10 MED ORDER — POTASSIUM CHLORIDE CRYS ER 20 MEQ PO TBCR
40.0000 meq | EXTENDED_RELEASE_TABLET | Freq: Once | ORAL | Status: AC
  Administered 2012-07-10: 40 meq via ORAL
  Filled 2012-07-10: qty 2

## 2012-07-10 NOTE — Progress Notes (Signed)
SUBJECTIVE: The patient is doing well today.   At this time, he denies chest pain, shortness of breath, or any new concerns.     Marland Kitchen aspirin  81 mg Oral Daily  . atorvastatin  80 mg Oral q1800  . carvedilol  6.25 mg Oral BID WC  . insulin aspart  0-15 Units Subcutaneous TID WC  . insulin aspart  0-5 Units Subcutaneous QHS  . magnesium sulfate 1 - 4 g bolus IVPB  2 g Intravenous Once  . ondansetron (ZOFRAN) IV  4 mg Intravenous Once  . potassium chloride  40 mEq Oral Once  . prasugrel  10 mg Oral Daily  . sodium chloride  3 mL Intravenous Q12H  . DISCONTD: carvedilol  3.125 mg Oral BID WC  . DISCONTD:  morphine injection  6 mg Intravenous Once      . sodium chloride 10 mL/hr at 07/09/12 0630    OBJECTIVE: Physical Exam: Filed Vitals:   07/10/12 0200 07/10/12 0400 07/10/12 0500 07/10/12 0600  BP: 99/71 91/54  101/65  Pulse: 83 82  81  Temp:  98.6 F (37 C)    TempSrc:  Oral    Resp: 18 18  20   Height:      Weight:   186 lb 15.2 oz (84.8 kg)   SpO2: 96% 95%  95%    Intake/Output Summary (Last 24 hours) at 07/10/12 0803 Last data filed at 07/09/12 2000  Gross per 24 hour  Intake    122 ml  Output    600 ml  Net   -478 ml    Telemetry reveals sinus rhythm, no further arrhythmias GEN- The patient is well appearing, alert and oriented x 3 today.   Head- normocephalic, atraumatic Eyes-  R sclera is errythematous, conjunctiva pink Ears- hearing intact Oropharynx- clear Neck- supple, no JVP Lymph- no cervical lymphadenopathy Lungs- Clear to ausculation bilaterally, normal work of breathing Heart- Regular rate and rhythm, no murmurs, rubs or gallops, PMI not laterally displaced GI- soft, NT, ND, + BS Extremities- no clubbing, cyanosis, or edema   LABS: Basic Metabolic Panel:  Basename 07/10/12 0515 07/09/12 0714 07/09/12 0331  NA 140 -- 139  K 3.6 -- 3.7  CL 103 -- 105  CO2 28 -- 22  GLUCOSE 122* -- 139*  BUN 11 -- 13  CREATININE 0.89 -- 0.77  CALCIUM 9.1  -- 9.3  MG 2.2 1.8 --  PHOS -- -- --   Liver Function Tests:  [facility] 07/09/12 0331 07/08/12 2334  AST 130* 66*  ALT 32 26  ALKPHOS 69 72  BILITOT 0.6 0.5  PROT 6.4 6.7  ALBUMIN 3.7 3.8   No results found for this basename: LIPASE:2,AMYLASE:2 in the last 72 hours CBC:  Basename 07/09/12 0331 07/08/12 2334 07/08/12 2017  WBC 16.0* 18.4* --  NEUTROABS -- 16.0* 12.9*  HGB 13.3 13.9 --  HCT 38.8* 40.5 --  MCV 80.2 80.7 --  PLT 259 274 --   Cardiac Enzymes:  Basename 07/09/12 1030 07/09/12 0331 07/08/12 2344  CKTOTAL -- -- --  CKMB -- -- --  CKMBINDEX -- -- --  TROPONINI >20.00* >20.00* 11.00*   BNP: No components found with this basename: POCBNP:3 D-Dimer: No results found for this basename: DDIMER:2 in the last 72 hours Hemoglobin A1C:  Basename 07/08/12 2334  HGBA1C 5.9*   Fasting Lipid Panel:  Basename 07/09/12 0331  CHOL 172  HDL 33*  LDLCALC 92  TRIG 161*  CHOLHDL 5.2  LDLDIRECT --  Thyroid Function Tests:  Basename 07/08/12 2334  TSH 1.240  T4TOTAL --  T3FREE --  THYROIDAB --   Anemia Panel: No results found for this basename: VITAMINB12,FOLATE,FERRITIN,TIBC,IRON,RETICCTPCT in the last 72 hours  RADIOLOGY: Portable Chest X-ray 1 View  07/08/2012  *RADIOLOGY REPORT*  Clinical Data: Anterior emboli.  PORTABLE CHEST - 1 VIEW  Comparison: None.  Findings: Shallow inspiration.  Normal heart size and pulmonary vascularity.  Slightly increased opacity in the right upper lung may represent infiltration or atelectasis.  No blunting of costophrenic angles.  No pneumothorax.  Mediastinal contours appear intact.  There is a linear structure projected over the left chest which may represent a PICC line.  If so, this is an atypical position. If no PICC line has been placed, and this may represent EKG lead.  Clinical correlation recommended.  IMPRESSION: Mild increased density in the right upper lung may represent infiltration or atelectasis.   Original Report  Authenticated By: Marlon Pel, M.D.     ASSESSMENT AND PLAN:  Principal Problem:  *Acute MI, initial Active Problems:  Ventricular fibrillation  1. Acute LAD occlusion/ acute MI Doing well s/p PCI emergently by Dr Excell Seltzer Continue ASA and Prasugrel Titrate coreg as able Continue statin Will need echo on Monday to assess EF  2. Reperfusion arrhythmias/ VF arrest in the field No further arrhythmias Clinically stable Will increase coreg as able Keep K>3.9 and Mg >1.9 I agree with Dr Excell Seltzer that LifeVest would be prudent at discharge  To telemetry today Cardiac rehab consulted   Hillis Range, MD 07/10/2012 8:03 AM

## 2012-07-11 DIAGNOSIS — I219 Acute myocardial infarction, unspecified: Secondary | ICD-10-CM

## 2012-07-11 LAB — GLUCOSE, CAPILLARY

## 2012-07-11 MED ORDER — CARVEDILOL 3.125 MG PO TABS
3.1250 mg | ORAL_TABLET | Freq: Two times a day (BID) | ORAL | Status: DC
  Administered 2012-07-11 – 2012-07-12 (×4): 3.125 mg via ORAL
  Filled 2012-07-11 (×6): qty 1

## 2012-07-11 MED ORDER — LISINOPRIL 2.5 MG PO TABS
2.5000 mg | ORAL_TABLET | Freq: Every day | ORAL | Status: DC
  Administered 2012-07-11 – 2012-07-12 (×2): 2.5 mg via ORAL
  Filled 2012-07-11 (×2): qty 1

## 2012-07-11 NOTE — Progress Notes (Signed)
  Echocardiogram 2D Echocardiogram has been performed.  Curtis Rosales 07/11/2012, 11:35 AM

## 2012-07-11 NOTE — Progress Notes (Signed)
    Subjective:  The patient is feeling well. He denies chest pain, chest pressure, dyspnea, or palpitations the  Objective:  Vital Signs in the last 24 hours: Temp:  [98 F (36.7 C)-98.7 F (37.1 C)] 98.1 F (36.7 C) (09/09 0500) Pulse Rate:  [75-97] 83  (09/09 0500) Resp:  [16-18] 16  (09/09 0500) BP: (92-109)/(57-70) 107/70 mmHg (09/09 0500) SpO2:  [90 %-95 %] 93 % (09/09 0500)  Intake/Output from previous day: 09/08 0701 - 09/09 0700 In: 363 [P.O.:360; I.V.:3] Out: -   Physical Exam: Pt is alert and oriented, NAD HEENT: normal Neck: JVP - normal, carotids 2+= without bruits Lungs: CTA bilaterally CV: RRR without murmur or gallop Abd: soft, NT, Positive BS, no hepatomegaly Ext: no C/C/E, distal pulses intact and equal Skin: warm/dry no rash   Lab Results:  Basename 07/09/12 0331 07/08/12 2334  WBC 16.0* 18.4*  HGB 13.3 13.9  PLT 259 274    Basename 07/10/12 0515 07/09/12 0331  NA 140 139  K 3.6 3.7  CL 103 105  CO2 28 22  GLUCOSE 122* 139*  BUN 11 13  CREATININE 0.89 0.77    Basename 07/09/12 1030 07/09/12 0331  TROPONINI >20.00* >20.00*    Cardiac Studies: 2-D echo pending  Tele: Personally reviewed. Sinus rhythm without arrhythmia.  Assessment/Plan:  1. Acute anterolateral myocardial infarction. The patient is stable following primary PCI of the LAD/diagonal bifurcation. He will remain on aspirin and effient for at least 12 months. Coreg has been increased to 6.25 mg, but his blood pressure remains low. I am going to decrease this to 3.125 mg to allow for initiation of low-dose ACE inhibitor. The patient will have an echocardiogram today and depending on his left ventricular ejection fraction, will consider a life vest at discharge.  2. Dyslipidemia. Total cholesterol 172, trig was arrested 34, HDL 33, LDL 92. Statin drug has been initiated.  3. Disposition: Pending echo today, the patient will likely be discharged tomorrow.   [name],  M.D. 07/11/2012, 8:27 AM

## 2012-07-11 NOTE — Care Management Note (Signed)
    Page 1 of 2   07/12/2012     10:46:43 AM   CARE MANAGEMENT NOTE 07/12/2012  Patient:  Curtis Rosales,Curtis Rosales   Account Number:  [id number]  Date Initiated:  07/11/2012  Documentation initiated by:  GRAVES-BIGELOW,Dyesha Henault  Subjective/Objective Assessment:   Pt admitted with Stemi. Pt has family support. Pt uses [facility] in [place]. No inusrance at this time. CM will also check cash price at [employer] in [place] for [employer].     Action/Plan:   CM provided pt with 30 day free card. MD please write Rx for 30 day free no refills and then the original rx with refills. Liily pt assist forms placed in chart. Pt states he would like to f/u in the [place] office.   Anticipated DC Date:  07/12/2012   Anticipated DC Plan:  HOME W HOME HEALTH SERVICES  In-house referral  Financial Counselor      DC Planning Services  CM consult  Medication Assistance      [facility] Choice  DURABLE MEDICAL EQUIPMENT   Choice offered to / List presented to:     DME arranged  VEST - LIFE VEST      DME agency  OTHER - SEE NOTE        Status of service:  Completed, signed off Medicare Important Message given?   (If response is "NO", the following Medicare IM given date fields will be blank) Date Medicare IM given:   Date Additional Medicare IM given:    Discharge Disposition:  HOME/SELF CARE  Per UR Regulation:  Reviewed for med. necessity/level of care/duration of stay  If discussed at Long Length of Stay Meetings, dates discussed:    Comments:  07-12-12 Tomi Bamberger, RN,BSN [phone] CM did speak to the pt and he wants to know the cash price of medication: [facility] 294.99. [place] [facility] price: 291.72. CM did speak to [facility] and the life vest has been leased and fiiting will happen today from [place]. No further needs from CM at this time.   07-11-12 [address] Tomi Bamberger, [place] [phone] CM did speak to rep at [facility] and she will see if can lease a life vest due to no inusrance. Will  f/u with in am.

## 2012-07-11 NOTE — Progress Notes (Signed)
CARDIAC REHAB PHASE I   PRE:  Rate/Rhythm: 89 SR    BP: sitting 110/80    SaO2:   MODE:  Ambulation: 1000 ft   POST:  Rate/Rhythm: 96 SR    BP: sitting 112/78     SaO2:   Tolerated well, feels good. BP stable although did not change with walking (did not seem exertional for pt). Ed completed with family present. Very receptive (esp wife). Requests his name be sent to [facility]. Gave financial aid application as he does not have insurance. To walk more today.  [phone]  Curtis Rosales CES, ACSM

## 2012-07-12 ENCOUNTER — Encounter (HOSPITAL_COMMUNITY): Payer: Self-pay | Admitting: Emergency Medicine

## 2012-07-12 ENCOUNTER — Encounter: Payer: Self-pay | Admitting: Pharmacist

## 2012-07-12 ENCOUNTER — Telehealth: Payer: Self-pay | Admitting: Pharmacist

## 2012-07-12 ENCOUNTER — Inpatient Hospital Stay (HOSPITAL_COMMUNITY)
Admission: EM | Admit: 2012-07-12 | Discharge: 2012-07-13 | DRG: 312 | Disposition: A | Discharge status: Home / Self Care | Payer: Medicaid Other | Attending: Cardiovascular Disease | Admitting: Cardiovascular Disease

## 2012-07-12 ENCOUNTER — Encounter (HOSPITAL_COMMUNITY): Payer: Self-pay | Admitting: Nurse Practitioner

## 2012-07-12 DIAGNOSIS — I059 Rheumatic mitral valve disease, unspecified: Secondary | ICD-10-CM | POA: Diagnosis present

## 2012-07-12 DIAGNOSIS — H1130 Conjunctival hemorrhage, unspecified eye: Secondary | ICD-10-CM | POA: Diagnosis present

## 2012-07-12 DIAGNOSIS — R55 Syncope and collapse: Principal | ICD-10-CM | POA: Diagnosis present

## 2012-07-12 DIAGNOSIS — I2510 Atherosclerotic heart disease of native coronary artery without angina pectoris: Secondary | ICD-10-CM

## 2012-07-12 DIAGNOSIS — I2109 ST elevation (STEMI) myocardial infarction involving other coronary artery of anterior wall: Secondary | ICD-10-CM | POA: Diagnosis present

## 2012-07-12 DIAGNOSIS — I25119 Atherosclerotic heart disease of native coronary artery with unspecified angina pectoris: Secondary | ICD-10-CM

## 2012-07-12 DIAGNOSIS — I255 Ischemic cardiomyopathy: Secondary | ICD-10-CM

## 2012-07-12 DIAGNOSIS — I2589 Other forms of chronic ischemic heart disease: Secondary | ICD-10-CM | POA: Diagnosis present

## 2012-07-12 DIAGNOSIS — Z9861 Coronary angioplasty status: Secondary | ICD-10-CM

## 2012-07-12 DIAGNOSIS — I498 Other specified cardiac arrhythmias: Secondary | ICD-10-CM | POA: Diagnosis present

## 2012-07-12 DIAGNOSIS — Z9089 Acquired absence of other organs: Secondary | ICD-10-CM

## 2012-07-12 DIAGNOSIS — I959 Hypotension, unspecified: Secondary | ICD-10-CM | POA: Diagnosis present

## 2012-07-12 HISTORY — DX: Acute myocardial infarction, unspecified: I21.9

## 2012-07-12 LAB — CBC WITH DIFFERENTIAL/PLATELET
Basophils Absolute: 0 10*3/uL (ref 0.0–0.1)
Lymphocytes Relative: 12 % (ref 12–46)
Lymphs Abs: 1.7 10*3/uL (ref 0.7–4.0)
Neutrophils Relative %: 76 % (ref 43–77)
Platelets: 264 10*3/uL (ref 150–400)
RBC: 4.92 MIL/uL (ref 4.22–5.81)
WBC: 14.4 10*3/uL — ABNORMAL HIGH (ref 4.0–10.5)

## 2012-07-12 LAB — BASIC METABOLIC PANEL
CO2: 24 mEq/L (ref 19–32)
Calcium: 9.2 mg/dL (ref 8.4–10.5)
Chloride: 104 mEq/L (ref 96–112)
GFR calc Af Amer: >90 mL/min (ref >90)
Sodium: 136 mEq/L (ref 135–145)

## 2012-07-12 LAB — TROPONIN I
Troponin I: 3.9 ng/mL (ref <0.30)
Troponin I: 3.46 ng/mL (ref <0.30)

## 2012-07-12 LAB — GLUCOSE, CAPILLARY
Glucose-Capillary: 118 mg/dL — ABNORMAL HIGH (ref 70–99)
Glucose-Capillary: 112 mg/dL — ABNORMAL HIGH (ref 70–99)

## 2012-07-12 MED ORDER — CARVEDILOL 3.125 MG PO TABS: 3.1250 mg | ORAL_TABLET | Freq: Two times a day (BID) | ORAL | Status: DC

## 2012-07-12 MED ORDER — PRASUGREL HCL 10 MG PO TABS: 10.0000 mg | ORAL_TABLET | Freq: Every day | ORAL | Status: DC

## 2012-07-12 MED ORDER — PRAVASTATIN SODIUM 40 MG PO TABS: 40.0000 mg | ORAL_TABLET | Freq: Every day | ORAL | Status: DC

## 2012-07-12 MED ORDER — SODIUM CHLORIDE 0.9 % IV BOLUS (SEPSIS)
1000.0000 mL | Freq: Once | INTRAVENOUS | Status: AC
  Administered 2012-07-12: 1000 mL via INTRAVENOUS

## 2012-07-12 MED ORDER — NITROGLYCERIN 0.4 MG SL SUBL: 0.4000 mg | SUBLINGUAL_TABLET | SUBLINGUAL | Status: DC | PRN

## 2012-07-12 MED ORDER — ONDANSETRON HCL 4 MG/2ML IJ SOLN
4.0000 mg | Freq: Once | INTRAMUSCULAR | Status: AC
  Administered 2012-07-12: 4 mg via INTRAVENOUS
  Filled 2012-07-12: qty 2

## 2012-07-12 MED ORDER — ASPIRIN EC 81 MG PO TBEC: 81.0000 mg | DELAYED_RELEASE_TABLET | Freq: Every day | ORAL | Status: AC

## 2012-07-12 MED ORDER — LISINOPRIL 2.5 MG PO TABS: 2.5000 mg | ORAL_TABLET | Freq: Every day | ORAL | Status: DC

## 2012-07-12 MED FILL — Dextrose Inj 5%: INTRAVENOUS | Qty: 1000 | Status: AC

## 2012-07-12 NOTE — Progress Notes (Signed)
Pt walking independently. Answered all questions. Discussed daily wts, low sodium and lifevest. Started lifevest video and got HF booklet. [phone] Ethelda Chick CES, ACSM

## 2012-07-12 NOTE — ED Notes (Signed)
Pt to ED via EMS.  Pt was just discharged from [facility] earlier today.  Pt was admitted 5 days ago with MI.  Pt st's he became dizzy today while wife was in store and started sweating then vomited.  On EMS arrival pt's b/p was low.  EMS gave approx NS reroute to ED.  Pt denies any chest pain or feeling dizzy at this time.  Color pale, skin warm and dry.  Wife at bedside.

## 2012-07-12 NOTE — Telephone Encounter (Signed)
Pt still in hospital 9/10 @2 :30pm, Spoke with his nurse Shanda Bumps and she will get a phone number and confirm address for system.

## 2012-07-12 NOTE — Progress Notes (Signed)
DC orders received.  Patient stable with no S/S of distress.  Discharge and medication instructions reviewed with patient and family.  Patient DC home with family. Nolon Nations

## 2012-07-12 NOTE — Discharge Summary (Signed)
Patient ID: Curtis Rosales,  MRN: [id number], DOB/AGE: 1961-04-16 51 y.o.  Admit date: 07/08/2012 Discharge date: 07/12/2012  Primary Care Provider: None Primary Cardiologist: M. Excell Seltzer, MD  Discharge Diagnoses Principal Problem:  *Acute MI, anterolateral wall, initial episode of care  **s/p PCI/DES of the LAD this admission Active Problems:  Ventricular fibrillation  **s/p defibrillation in the field and lifevest placement prior to discharge  Cardiomyopathy, ischemic  **EF 25-30% by echo this admission  CAD (coronary artery disease)   Allergies No Known Allergies  Procedures  Cardiac Catheterization and Percutaneous Coronary Intervention 07/08/2012  PROCEDURAL FINDINGS Hemodynamics: AO 83/59 LV 80/13  Coronary angiography: Coronary dominance: right  Left mainstem:  widely patent with minimal irregularity. Left anterior descending (LAD):  total occlusion of the proximal vessel. After re\re opening the LAD, there is critical stenosis involving the LAD first diagonal bifurcation. The vessel wraps around the left ventricular apex. Before reperfusion, there was TIMI 0 flow.   **The LAD was stented with a 3.0 x 16 mm Promus Element DES**  Left circumflex (LCx):  there is a moderate caliber intermediate without significant stenosis. The AV groove circumflex has diffuse 30% proximal stenosis. This supplies a very large obtuse marginal branch without significant stenosis. Right coronary artery (RCA):  the right coronary artery is a large, dominant vessel. The vessel supplies the PDA and posterolateral branch. There is no significant disease throughout the right coronary artery distribution. Left ventriculography: Left ventricular systolic function is severely depressed. There is akinesis of the entire anterolateral and apical walls. The left jugular ejection fraction is estimated at 30%.   _____________  2D Echocardiogram 07/11/2012  Study Conclusions  - Left ventricle: LVEF is  approximately 25 to 30% with   severe hypokinesis of the mid anterior, basal inferior and   septal walls. Akinesis of the apical, distal posterior,   mid/distal inferior , distal anterior, distal lateral   walls. Doppler parameters are consistent with abnormal   left ventricular relaxation (grade 1 diastolic   dysfunction). - Mitral valve: Mild regurgitation. _____________  History of Present Illness  51 y/o male without prior cardiac history who was in his usual state of health until 07/08/2012, when while working on his car, he developed sudden onset of severe left chest and shoulder pain associated with dyspnea, nausea, and diaphoresis.  EMS was called and upon arrival he was treated with 4 baby aspirins.  En route to the ED, he suffered a ventricular fibrillation arrest requiring defibrillation x 1, with successful reversion to sinu rhythm.  This was followed by projectile vomiting.  Upon arrival to the ED, pt continued to c/o chest pain and ECG showed slight anterolateral  ST elevation with inferior ST/T changes.  Code STEMI was called and pt was taken to the cath lab for emergent evaluation.  Hospital Course  Pt underwent diagnostic catheterization revealing an occluded proximal LAD and otherwise nonobstructive CAD and reduced LV fxn of approximately 30%.  The LAD was the infarct vessel and was successfully stented using a Promus Element DES as outlined above.  Pt tolerated procedure well and remained hemodynamically stable.  He was monitored in the coronary intensive care unit and was noted to have runs of non-sustained ventricular tachycardia along with AIVR.  Carvedilol therapy was initiated and subsequently titrated however due to borderline hypotension, dose was down-titrated back to 3.125mg  bid.  Pt had no further chest pain and was transferred out to the floor by 9/9.  Low-dose ACEI therapy was added to his regimen given  reduced LV function and f/u 2d echocardiogram confirmed EF of 25-30%  with multiple wall motion abnormalities.  Pt has tolerated BB and ACEI therapy and his volume status has remained stable.  He has not required diuresis.  Ongoing borderline hypotension has prevented [place] from titrating his BB/ACEI therapy further or adding spironolactone, though we will look to do this in the outpt setting.  In the setting of VF arrest with post-reperfusion ventricular arrhythmias, it was felt that pt would benefit from lifevest placement as primary prevention of sudden cardiac death.  He has been fitted for the vest this afternoon and will be discharged home today in good condition.  Besides titrating his meds in the outpt setting, we will plan to repeat an echo in approximately 3 months to re-evaluate LV fxn and determine his candidacy for ICD placement.  Discharge Vitals Blood pressure 96/72, pulse 94, temperature 97.8 F (36.6 C), temperature source Oral, resp. rate 18, height 5\' 11"  (1.803 m), weight 186 lb 15.2 oz (84.8 kg), SpO2 95.00%.  Filed Weights   07/08/12 2217 07/09/12 0500 07/10/12 0500  Weight: 187 lb 9.8 oz (85.1 kg) 190 lb 11.2 oz (86.5 kg) 186 lb 15.2 oz (84.8 kg)   Labs  CBC Lab Results  Component Value Date   WBC 16.0* 07/09/2012   HGB 13.3 07/09/2012   HCT 38.8* 07/09/2012   MCV 80.2 07/09/2012   PLT 259 07/09/2012   Basic Metabolic Panel  Basename 07/10/12 0515  NA 140  K 3.6  CL 103  CO2 28  GLUCOSE 122*  BUN 11  CREATININE 0.89  CALCIUM 9.1  MG 2.2  PHOS --   Liver Function Tests Lab Results  Component Value Date   ALT 32 07/09/2012   AST 130* 07/09/2012   ALKPHOS 69 07/09/2012   BILITOT 0.6 07/09/2012   Cardiac Enzymes Lab Results  Component Value Date   TROPONINI >20.00* 07/09/2012   Hemoglobin A1C Lab Results  Component Value Date   HGBA1C 5.9* 07/08/2012   Fasting Lipid Panel Lab Results  Component Value Date   CHOL 172 07/09/2012   HDL 33* 07/09/2012   LDLCALC 92 07/09/2012   TRIG 234* 07/09/2012   CHOLHDL 5.2 07/09/2012    Thyroid Function  Tests Lab Results  Component Value Date   TSH 1.240 07/08/2012    Disposition  Pt is being discharged home today in good condition.  Follow-up Plans & Appointments  Follow-up Information    Follow up with Tereso Newcomer, PA on 07/19/2012. (12:00)    Contact information:   [address] [place] [address] [phone]       Follow up with [name], MD on 08/09/2012. (4:30 PM)    Contact information:   [address] [place] [address] [phone]         Discharge Medications  Medication List  As of 07/12/2012 12:33 PM   TAKE these medications         aspirin EC 81 MG tablet   Take 1 tablet (81 mg total) by mouth daily.      carvedilol 3.125 MG tablet   Commonly known as: COREG   Take 1 tablet (3.125 mg total) by mouth 2 (two) times daily with a meal.      lisinopril 2.5 MG tablet   Commonly known as: PRINIVIL,ZESTRIL   Take 1 tablet (2.5 mg total) by mouth daily.      nitroGLYCERIN 0.4 MG SL tablet  Commonly known as: NITROSTAT   Place 1 tablet (0.4 mg total) under the tongue every 5 (five) minutes x 3 doses as needed for chest pain.      omeprazole 20 MG tablet   Commonly known as: PRILOSEC OTC   Take 20 mg by mouth daily.      prasugrel 10 MG Tabs   Commonly known as: EFFIENT   Take 1 tablet (10 mg total) by mouth daily.      pravastatin 40 MG tablet   Commonly known as: PRAVACHOL   Take 1 tablet (40 mg total) by mouth daily.           Outstanding Labs/Studies  Follow-up echo in 12 wks  Duration of Discharge Encounter   Greater than 30 minutes including physician time.  Signed, Nicolasa Ducking NP 07/12/2012, 12:33 PM

## 2012-07-12 NOTE — Progress Notes (Signed)
Patient Name: Curtis Rosales Date of Encounter: 07/12/2012   Principal Problem:  *Acute MI, anterolateral wall, initial episode of care Active Problems:  Ventricular fibrillation  Cardiomyopathy, ischemic  CAD (coronary artery disease) of artery bypass graft    SUBJECTIVE  No chest pain or groin complaints.  He's noted mild doe walking back from the bathroom.  CURRENT MEDS    . aspirin  81 mg Oral Daily  . atorvastatin  80 mg Oral q1800  . carvedilol  3.125 mg Oral BID WC  . insulin aspart  0-15 Units Subcutaneous TID WC  . insulin aspart  0-5 Units Subcutaneous QHS  . lisinopril  2.5 mg Oral Daily  . ondansetron (ZOFRAN) IV  4 mg Intravenous Once  . prasugrel  10 mg Oral Daily  . sodium chloride  3 mL Intravenous Q12H    OBJECTIVE  Filed Vitals:   07/11/12 2100 07/12/12 0500 07/12/12 0841 07/12/12 0952  BP: 102/66 103/70 101/69 96/72  Pulse: 94 83 94   Temp: 99 F (37.2 C) 97.8 F (36.6 C)    TempSrc: Oral Oral    Resp: 18 18    Height:      Weight:      SpO2: 93% 95%      Intake/Output Summary (Last 24 hours) at 07/12/12 1008 Last data filed at 07/12/12 0800  Gross per 24 hour  Intake    720 ml  Output      0 ml  Net    720 ml   Filed Weights   07/08/12 2217 07/09/12 0500 07/10/12 0500  Weight: 187 lb 9.8 oz (85.1 kg) 190 lb 11.2 oz (86.5 kg) 186 lb 15.2 oz (84.8 kg)   PHYSICAL EXAM  General: Pleasant, NAD. Neuro: Alert and oriented X 3. Moves all extremities spontaneously. Psych: Normal affect. HEENT:  Normal w/ exception of r subconjunctival hemm.  Neck: Supple without bruits or JVD. Lungs:  Resp regular and unlabored, CTA. Heart: RRR + s4, no murmurs. Abdomen: Soft, non-tender, non-distended, BS + x 4.  Extremities: No clubbing, cyanosis or edema. DP/PT/Radials 2+ and equal bilaterally.  R groin w/o bleeding, bruit, hematoma.  Accessory Clinical Findings  Basic Metabolic Panel  Basename 07/10/12 0515  NA 140  K 3.6  CL 103  CO2 28    GLUCOSE 122*  BUN 11  CREATININE 0.89  CALCIUM 9.1  MG 2.2  PHOS --   Cardiac Enzymes  Basename 07/09/12 1030  CKTOTAL --  CKMB --  CKMBINDEX --  TROPONINI >20.00*   Lab Results  Component Value Date   CHOL 172 07/09/2012   HDL 33* 07/09/2012   LDLCALC 92 07/09/2012   TRIG 234* 07/09/2012   CHOLHDL 5.2 07/09/2012   Lab Results  Component Value Date   HGBA1C 5.9* 07/08/2012    TELE  Rsr, no ectopy.  2D Echocardiogram  Study Conclusions  - Left ventricle: LVEF is approximately 25 to 30% with   severe hypokinesis of the mid anterior, basal inferior and   septal walls. Akinesis of the apical, distal posterior,   mid/distal inferior , distal anterior, distal lateral   walls. Doppler parameters are consistent with abnormal   left ventricular relaxation (grade 1 diastolic   dysfunction). - Mitral valve: Mild regurgitation. _____________  ASSESSMENT AND PLAN  1.  Acute antlat STEMI/CAD:  S/p PCI/DES to LAD.  No recurrent chest pain.  Cont asa, bb, acei, statin, effient.  2.  ICM:  Has had some DOE though volume looks good.  Weight stable.  EF 25-30% with multiple wall motion abnormalities.  Discussed the importance of daily weights, sodium restriction, med compliance, and Ss reporting.  Cont low-dose coreg & lisinopril.  His pressures have remained in 90's to low 100's, thus no room for titration of bb/acei, or addition of spiro.  In setting of VF arrest with significant reperfusion arrhythmias, pt will be placed for lifevest today prior to d/c.  Plan to f/u echo in 12 wks to determine ICD candidacy.  3.  HL/HTG:  Cont statin.  Will switch to pravachol 40 @ d/c since he has no insurance/pharmacy coverage.  4.  Dispo:  D/c home today after lifevest fitting.  He wishes to f/u in [place] with Dr. Excell Seltzer.  Will arrange for 7 day transition of care visit w/ Tereso Newcomer.  Signed, Nicolasa Ducking NP   Patient seen, examined. Available data reviewed. Agree with findings, assessment,  and plan as outlined by Ward Givens, NP. The patient remained stable. I have reviewed his echocardiogram results and I agree that he should have a lifevest placed for primary prevention of sudden cardiac death. We will reassess his left ventricular function in 3 months with an echocardiogram. I agree that his medications cannot be pushed any further and I will try to initiate Spirinolactone as an outpatient. For now we will continue with his carvedilol and lisinopril at low doses.  [name], M.D. 07/12/2012 1:06 PM

## 2012-07-12 NOTE — Telephone Encounter (Signed)
Message copied by Velda Shell on Tue Jul 12, 2012  1:21 PM ------      Message from: Glynda Jaeger A      Created: Tue Jul 12, 2012 10:22 AM       Transitional care pt, per chris berge            Thanks, [employer]

## 2012-07-12 NOTE — ED Provider Notes (Signed)
History     CSN: [id number]  Arrival date & time 07/12/12  1946   First MD Initiated Contact with Patient 07/12/12 1947      Chief Complaint  Patient presents with  . Hypotension    (Consider location/radiation/quality/duration/timing/severity/associated sxs/prior treatment) The history is provided by the patient.   51 year old male was discharged from the hospital today following STEMI with stent placement. He was also discharged with a life vest. He was going to the pharmacy when he started feeling dizzy and nauseous and vomited. He got very clammy and pale. He was sitting up in a car and his wife says that his eyes rolled up in his head in the passed out. When he laid down, he woke up. His wife called for EMS and was advised to given a nitroglycerin tablet which he did. EMS arrived and noted blood pressure was low and he was transported to the emergency department. He denies chest pain, dyspnea. He has mild ongoing nausea but no longer feels diaphoretic. He does not feel dizzy currently.  Past Medical History  Diagnosis Date  . CAD (coronary artery disease)     a. 07/2012 Anterolateral MI/Cath: LAD 100 100%-> 3.0x16 Promus Element DES, otw nonobs dzs, EF 30%  . Ischemic cardiomyopathy     a. 07/2012 Echo: EF 25-30% w/ mid ant, basal inf, sept Sev HK & apical, dist post, mid/dist inf, dist ant, dist lat AK. Gr 1 DD, mild MR.  . VF (ventricular fibrillation)     a. in setting of MI 07/2012  . MI (myocardial infarction)     Past Surgical History  Procedure Date  . Appendectomy   . Cardiac catheterization     No family history on file.  History  Substance Use Topics  . Smoking status: Never Smoker   . Smokeless tobacco: Never Used  . Alcohol Use: No      Review of Systems  All other systems reviewed and are negative.    Allergies  Review of patient's allergies indicates no known allergies.  Home Medications   Current Outpatient Rx  Name Route Sig Dispense Refill    . ASPIRIN EC 81 MG PO TBEC Oral Take 1 tablet (81 mg total) by mouth daily.    Marland Kitchen CARVEDILOL 3.125 MG PO TABS Oral Take 1 tablet (3.125 mg total) by mouth 2 (two) times daily with a meal. 60 tablet 6  . LISINOPRIL 2.5 MG PO TABS Oral Take 1 tablet (2.5 mg total) by mouth daily. 30 tablet 6  . NITROGLYCERIN 0.4 MG SL SUBL Sublingual Place 1 tablet (0.4 mg total) under the tongue every 5 (five) minutes x 3 doses as needed for chest pain. 25 tablet 3  . OMEPRAZOLE MAGNESIUM 20 MG PO TBEC Oral Take 20 mg by mouth daily.    Marland Kitchen PRASUGREL HCL 10 MG PO TABS Oral Take 1 tablet (10 mg total) by mouth daily. 30 tablet 6  . PRAVASTATIN SODIUM 40 MG PO TABS Oral Take 1 tablet (40 mg total) by mouth daily. 30 tablet 6    BP 85/59  Pulse 79  Temp 97.8 F (36.6 C) (Oral)  Resp 18  SpO2 98%  Physical Exam  Nursing note and vitals reviewed. .51year old male, resting comfortably and in no acute distress. Vital signs are significant for hypertension with blood pressure 85/59. Oxygen saturation is 94%, which is normal. Head is normocephalic and atraumatic. PERRLA, EOMI. right subconjunctival hemorrhage is present medially. Oropharynx is clear. Neck is nontender and  supple without adenopathy or JVD or bruit. Back is nontender and there is no CVA tenderness. Lungs are clear without rales, wheezes, or rhonchi. Chest is nontender. Heart has regular rate and rhythm without murmur. Abdomen is soft, flat, nontender without masses or hepatosplenomegaly and peristalsis is normoactive. Extremities have no cyanosis or edema, full range of motion is present. Skin is pale, warm, and dry without rash. Neurologic: Mental status is normal, cranial nerves are intact, there are no motor or sensory deficits.   ED Course  Procedures (including critical care time)   Labs Reviewed  CBC WITH DIFFERENTIAL  BASIC METABOLIC PANEL  TROPONIN I  TROPONIN I   No results found.   Date: 07/12/2012  Rate: 78  Rhythm: normal  sinus rhythm  QRS Axis: right  Intervals: normal  ST/T Wave abnormalities: nonspecific T wave changes  Conduction Disutrbances:none  Narrative Interpretation: Right axis deviation, minor nonspecific T wave changes. When compared with ECG of 07/10/2012, axis has shifted rightward, ST segments are now isoelectric, and there has been improvement in T wave changes.  Old EKG Reviewed: changes noted    1. Hypotension   2. Syncope     CRITICAL CARE Performed by: ZOXWR,UEAVW   Total critical care time: 40 minutes  Critical care time was exclusive of separately billable procedures and treating other patients.  Critical care was necessary to treat or prevent imminent or life-threatening deterioration.  Critical care was time spent personally by me on the following activities: development of treatment plan with patient and/or surrogate as well as nursing, discussions with consultants, evaluation of patient's response to treatment, examination of patient, obtaining history from patient or surrogate, ordering and performing treatments and interventions, ordering and review of laboratory studies, ordering and review of radiographic studies, pulse oximetry and re-evaluation of patient's condition.   MDM  Apparent vasovagal episode with syncope secondary to his being maintained in a sitting position. Persistent hypotension following initial vasovagal episode. Some of this may be due to the nitroglycerin, but he has had sufficient time for the nitroglycerin to wear off. He'll be given IV fluids and serial troponins will be checked. Prior hospital record was reviewed and he had a stent placed in his LAD but was noted to have an ejection fraction of only 30%.  After 2 L of IV fluid, blood pressure has only come up to the low 90s. In light of his low ejection fraction, I feel he probably needs to be admitted for further observation. Troponin has come back over 3, but this is down from a troponin of over 21  he had his STEMI. He will need serial troponins to determine whether the trajectory is up or down. Case has been discussed with Dr. Mayford Knife of cardiology who agrees to admit the patient.      Dione Booze, MD 07/12/12 2220

## 2012-07-12 NOTE — H&P (Addendum)
Admit date: 07/12/2012 Referring Physician  Dr. Preston Fleeting Primary Cardiologist Dr. Excell Seltzer Chief complaint/reason for admission: syncope  HPI: This is a 51yo WM with recent history of anterolateral MI with occluded LAD s/p DES otherwise nonobstructive ASCAD and moderate LV dysfunction with EF 25-30%.  His MI was complicated by ventricular fibrillation.  He was discharged earlier today in stable condition.  He stopped at [facility] on the way home.  His wife went in to get the prescriptions and he stayed in the car in the air conditioner.  He started to feel dizzy and got nauseated and vomited and then passed out. He denied any palpitations prior to the event.  He is wearing a life vest which did not discharge.  On arrival of EMS he said that EMS told him his heart rate apparently apparently got down in the 40's and he was hypotensive.  In ER he was felt to have had a vasovagal reaction and was given IVF with some improvement in his BP but BP currently still 90/53mmHg.  Heart rate now in the 80's.  He denied any chest pain or SOB.      PMH:    Past Medical History  Diagnosis Date  . CAD (coronary artery disease)     a. 07/2012 Anterolateral MI/Cath: LAD 100 100%-> 3.0x16 Promus Element DES, otw nonobs dzs, EF 30%  . Ischemic cardiomyopathy     a. 07/2012 Echo: EF 25-30% w/ mid ant, basal inf, sept Sev HK & apical, dist post, mid/dist inf, dist ant, dist lat AK. Gr 1 DD, mild MR.  . VF (ventricular fibrillation)     a. in setting of MI 07/2012  . MI (myocardial infarction)     PSH:    Past Surgical History  Procedure Date  . Appendectomy   . Cardiac catheterization     ALLERGIES:   Review of patient's allergies indicates no known allergies.  Prior to Admit Meds:   (Not in a hospital admission) Family HX:   No family history on file. Social HX:    History   Social History  . Marital Status: Married    Spouse Name: N/A    Number of Children: N/A  . Years of Education: N/A   Occupational  History  . Journalist, newspaper    Social History Main Topics  . Smoking status: Never Smoker   . Smokeless tobacco: Never Used  . Alcohol Use: No  . Drug Use: No  . Sexually Active: Not on file     did not ask   Other Topics Concern  . Not on file   Social History Narrative   He is not able to remember any of his past medical history, other than  He had an appendectomy. He cannot remember any medicines he takes or any conditions for which he receives treatment. He also cannot remember the name of his physician. Patient lives with wife in [place] and drives racecars for a hobby.     ROS:  All 11 ROS were addressed and are negative except what is stated in the HPI  PHYSICAL EXAM Filed Vitals:   07/12/12 1941  BP: 85/59  Pulse: 79  Temp: 97.8 F (36.6 C)  Resp: 18   General: Well developed, well nourished, in no acute distress Head: Eyes PERRLA, No xanthomas.Right conjunctival hemorrhage   Normal cephalic and atramatic  Lungs:   Clear bilaterally to auscultation and percussion. Heart:   HRRR S1 S2 Pulses are 2+ & equal.  No carotid bruit. No JVD.  No abdominal bruits. No femoral bruits. Abdomen: Bowel sounds are positive, abdomen soft and non-tender without masses  Extremities:   No clubbing, cyanosis or edema.  DP +1 Neuro: Alert and oriented X 3. Psych:  Good affect, responds appropriately   Labs:   Lab Results  Component Value Date   WBC 14.4* 07/12/2012   HGB 13.6 07/12/2012   HCT 39.9 07/12/2012   MCV 81.1 07/12/2012   PLT 264 07/12/2012    Lab 07/12/12 2022 07/09/12 0331  NA 136 --  K 4.1 --  CL 104 --  CO2 24 --  BUN 19 --  CREATININE 1.07 --  CALCIUM 9.2 --  PROT -- 6.4  BILITOT -- 0.6  ALKPHOS -- 69  ALT -- 32  AST -- 130*  GLUCOSE 111* --   Lab Results  Component Value Date   TROPONINI 3.90* 07/12/2012   No results found for this basename: PTT   Lab Results  Component Value Date   INR 1.02 07/08/2012     Lab Results  Component Value Date     CHOL 172 07/09/2012   Lab Results  Component Value Date   HDL 33* 07/09/2012   Lab Results  Component Value Date   LDLCALC 92 07/09/2012   Lab Results  Component Value Date   TRIG 234* 07/09/2012   Lab Results  Component Value Date   CHOLHDL 5.2 07/09/2012   No results found for this basename: LDLDIRECT      Radiology:  None  EKG:  ASSESSMENT:  1.  Syncope which sounds vasovagal with hypotension and bradycardia.  He is wearing a life vest that did not discharge so unlikely to be a ventricular arrhythmia.   2.  CAD s/p recent anterolateral MI s/p PCI of LAD 3.  Moderate LV dysfunction EF 30% 4.  Vfib in setting of MI now wearing Life Vest 5.  Right Conjunctival hemorrhage present on at time of MI 6.  Hypotension improved after IV fluid  PLAN:   1.  Admit to tele bed 2.  Cycle cardiac enzymes 3.  Will have Life Vest interrogated 4.  Continue ASA/Effient/statin 5.  Hold ACEI/beta blocker 6.  No SQ Heparin for VTE prophylaxis due to conjunctival hemorrhage - will use SCDs  Quintella Reichert, MD  07/12/2012  10:14 PM

## 2012-07-13 DIAGNOSIS — R55 Syncope and collapse: Principal | ICD-10-CM

## 2012-07-13 LAB — CBC WITH DIFFERENTIAL/PLATELET
Basophils Absolute: 0 10*3/uL (ref 0.0–0.1)
Eosinophils Absolute: 0.2 10*3/uL (ref 0.0–0.7)
Lymphs Abs: 2.6 10*3/uL (ref 0.7–4.0)
MCH: 27.6 pg (ref 26.0–34.0)
Neutrophils Relative %: 70 % (ref 43–77)
Platelets: 304 10*3/uL (ref 150–400)
RBC: 4.96 MIL/uL (ref 4.22–5.81)
RDW: 13.9 % (ref 11.5–15.5)
WBC: 13.2 10*3/uL — ABNORMAL HIGH (ref 4.0–10.5)

## 2012-07-13 LAB — COMPREHENSIVE METABOLIC PANEL
ALT: 29 U/L (ref 0–53)
AST: 26 U/L (ref 0–37)
Albumin: 3.1 g/dL — ABNORMAL LOW (ref 3.5–5.2)
Alkaline Phosphatase: 74 U/L (ref 39–117)
Glucose, Bld: 103 mg/dL — ABNORMAL HIGH (ref 70–99)
Potassium: 3.8 mEq/L (ref 3.5–5.1)
Sodium: 137 mEq/L (ref 135–145)
Total Protein: 6.4 g/dL (ref 6.0–8.3)

## 2012-07-13 LAB — CK TOTAL AND CKMB (NOT AT ARMC): Total CK: 128 U/L (ref 7–232)

## 2012-07-13 MED ORDER — SIMVASTATIN 20 MG PO TABS
20.0000 mg | ORAL_TABLET | Freq: Every day | ORAL | Status: DC
  Administered 2012-07-13: 20 mg via ORAL
  Filled 2012-07-13: qty 1

## 2012-07-13 MED ORDER — ONDANSETRON HCL 4 MG/2ML IJ SOLN: 4.0000 mg | Freq: Four times a day (QID) | INTRAMUSCULAR | Status: DC | PRN

## 2012-07-13 MED ORDER — ASPIRIN EC 81 MG PO TBEC
81.0000 mg | DELAYED_RELEASE_TABLET | Freq: Every day | ORAL | Status: DC
  Administered 2012-07-13: 81 mg via ORAL
  Filled 2012-07-13: qty 1

## 2012-07-13 MED ORDER — ACETAMINOPHEN 325 MG PO TABS
650.0000 mg | ORAL_TABLET | ORAL | Status: DC | PRN
  Administered 2012-07-13: 650 mg via ORAL
  Filled 2012-07-13: qty 2

## 2012-07-13 MED ORDER — CARVEDILOL 3.125 MG PO TABS
3.1250 mg | ORAL_TABLET | Freq: Two times a day (BID) | ORAL | Status: DC
  Administered 2012-07-13: 3.125 mg via ORAL
  Filled 2012-07-13 (×3): qty 1

## 2012-07-13 MED ORDER — OMEPRAZOLE MAGNESIUM 20 MG PO TBEC
20.0000 mg | DELAYED_RELEASE_TABLET | Freq: Every day | ORAL | Status: DC
  Administered 2012-07-13: 20 mg via ORAL
  Filled 2012-07-13: qty 1

## 2012-07-13 MED ORDER — PRASUGREL HCL 10 MG PO TABS
10.0000 mg | ORAL_TABLET | Freq: Every day | ORAL | Status: DC
  Administered 2012-07-13: 10 mg via ORAL
  Filled 2012-07-13: qty 1

## 2012-07-13 NOTE — Progress Notes (Signed)
DC orders received.  Patient stable with no S/S of distress.  Medication and discharge information reviewed with patient and family members.  Patient DC home with family. Nolon Nations

## 2012-07-13 NOTE — Care Management Note (Signed)
    Page 1 of 1   07/13/2012     2:53:56 PM   CARE MANAGEMENT NOTE 07/13/2012  Patient:  Curtis Rosales,Curtis Rosales   Account Number:  [id number]  Date Initiated:  07/13/2012  Documentation initiated by:  GRAVES-BIGELOW,Laquasha Groome  Subjective/Objective Assessment:   Pt admitted with syncope. Hx CAD. Just d/c on yesterday. Has family support.     Action/Plan:   No needs from CM at this time.   Anticipated DC Date:  07/13/2012   Anticipated DC Plan:  HOME/SELF CARE      DC Planning Services  CM consult      Choice offered to / List presented to:             Status of service:  Completed, signed off Medicare Important Message given?   (If response is "NO", the following Medicare IM given date fields will be blank) Date Medicare IM given:   Date Additional Medicare IM given:    Discharge Disposition:  HOME/SELF CARE  Per UR Regulation:  Reviewed for med. necessity/level of care/duration of stay  If discussed at Long Length of Stay Meetings, dates discussed:    Comments:

## 2012-07-13 NOTE — Discharge Summary (Signed)
CARDIOLOGY DISCHARGE SUMMARY   Patient ID: Curtis Rosales MRN: [id number] DOB/AGE: 50-Feb-1962 50 y.o.  Admit date: 07/12/2012 Discharge date: 07/13/2012  Primary Discharge Diagnosis:  Syncope - consistent with a vasovagal event Secondary Discharge Diagnosis:  Past Medical History  Diagnosis Date  . CAD (coronary artery disease)     a. 07/2012 Anterolateral MI/Cath: LAD 100 100%-> 3.0x16 Promus Element DES, otw nonobs dzs, EF 30%  . Ischemic cardiomyopathy     a. 07/2012 Echo: EF 25-30% w/ mid ant, basal inf, sept Sev HK & apical, dist post, mid/dist inf, dist ant, dist lat AK. Gr 1 DD, mild MR.  . VF (ventricular fibrillation)     a. in setting of MI 07/2012  . MI (myocardial infarction)    Procedures: 2D echocardiogram  Hospital Course: Curtis Rosales is a 51 year old male with a history of coronary artery disease, recently diagnosed. He was discharged from the hospital yesterday. On his way home, he had a syncopal event. He was wearing a lifetest at this time. He was hypotensive upon EMS arrival. There was also some bradycardia with rates reported in the 40s but no strips are available for review. He was hydrated with improvement in his blood pressure. He was admitted for further evaluation and treatment.  His initial blood pressure was 78/46. This improved to approximately 100 with hydration. His troponins were checked and were elevated but were trending down. His CK-MB was negative. The elevated troponin is felt to be from his previous MI and he is not felt to have any new occlusion. His ACE inhibitor was held but because of his MI, a low-dose of the beta blocker was continued. He was wearing a lifevest and this was interrogated but showed no significant arrhythmias and it had not fired.   On 07/13/2012, Curtis Rosales was ambulating without chest pain or shortness of breath. His blood pressure had improved with hydration and medication changes. He was evaluated by Dr. Excell Seltzer and considered stable for  discharge, to followup as an outpatient.  Labs:  Lab Results  Component Value Date   WBC 13.2* 07/13/2012   HGB 13.7 07/13/2012   HCT 40.1 07/13/2012   MCV 80.8 07/13/2012   PLT 304 07/13/2012    Lab 07/13/12 0432  NA 137  K 3.8  CL 104  CO2 21  BUN 17  CREATININE 0.87  CALCIUM 9.3  PROT 6.4  BILITOT 0.9  ALKPHOS 74  ALT 29  AST 26  GLUCOSE 103*    Basename 07/13/12 1020 07/13/12 0432 07/12/12 2231  CKTOTAL 128 -- --  CKMB 3.6 -- --  CKMBINDEX -- -- --  TROPONINI 2.11* 2.17* 3.46*    Radiology: Portable Chest X-ray 1 View 07/08/2012  *RADIOLOGY REPORT*  Clinical Data: Anterior emboli.  PORTABLE CHEST - 1 VIEW  Comparison: None.  Findings: Shallow inspiration.  Normal heart size and pulmonary vascularity.  Slightly increased opacity in the right upper lung may represent infiltration or atelectasis.  No blunting of costophrenic angles.  No pneumothorax.  Mediastinal contours appear intact.  There is a linear structure projected over the left chest which may represent a PICC line.  If so, this is an atypical position. If no PICC line has been placed, and this may represent EKG lead.  Clinical correlation recommended.  IMPRESSION: Mild increased density in the right upper lung may represent infiltration or atelectasis.   Original Report Authenticated By: Marlon Pel, M.D.    EKG: 13-Jul-2012 04:52:51  Normal sinus rhythm Septal infarct ,  age undetermined T wave abnormality, consider lateral ischemia Prolonged QT Abnormal ECG 70mm/s 46mm/mV 100Hz  8.0.1 12SL 241 CID: 1 Referred by: Confirmed By: Rinaldo Cloud MD Vent. rate 81 BPM PR interval 146 ms QRS duration 92 ms QT/QTc 402/466 ms P-R-T axes 44 61 109  Echo: 07/11/2012 Study Conclusions - Left ventricle: LVEF is approximately 25 to 30% with severe hypokinesis of the mid anterior, basal inferior and septal walls. Akinesis of the apical, distal posterior, mid/distal inferior , distal anterior, distal lateral walls.  Doppler parameters are consistent with abnormal left ventricular relaxation (grade 1 diastolic dysfunction). - Mitral valve: Mild regurgitation.  FOLLOW UP PLANS AND APPOINTMENTS No Known Allergies   Medication List     As of 07/13/2012  5:08 PM    STOP taking these medications         lisinopril 2.5 MG tablet   Commonly known as: PRINIVIL,ZESTRIL      TAKE these medications         aspirin EC 81 MG tablet   Take 1 tablet (81 mg total) by mouth daily.      carvedilol 3.125 MG tablet   Commonly known as: COREG   Take 1 tablet (3.125 mg total) by mouth 2 (two) times daily with a meal.      nitroGLYCERIN 0.4 MG SL tablet   Commonly known as: NITROSTAT   Place 1 tablet (0.4 mg total) under the tongue every 5 (five) minutes x 3 doses as needed for chest pain.      omeprazole 20 MG tablet   Commonly known as: PRILOSEC OTC   Take 20 mg by mouth daily.      prasugrel 10 MG Tabs   Commonly known as: EFFIENT   Take 1 tablet (10 mg total) by mouth daily.      pravastatin 40 MG tablet   Commonly known as: PRAVACHOL   Take 1 tablet (40 mg total) by mouth daily.           Discharge Orders    Future Appointments: Provider: Department: Dept Phone: Center:   07/19/2012 12:10 PM Beatrice Lecher, PA [facility] [phone] [facility]   08/09/2012 4:30 PM [name], MD [facility] [phone] [facility]     Follow-up Information    Follow up with Tereso Newcomer, PA. On 07/19/2012. (at 12:10 pm)    Contact information:   [address] [place] [address] [phone]          BRING ALL MEDICATIONS WITH YOU TO FOLLOW UP APPOINTMENTS  Time spent with patient to include physician time: 33 min Signed: Theodore Demark 07/13/2012, 5:08 PM Co-Sign MD

## 2012-07-13 NOTE — Progress Notes (Signed)
    Subjective:  Feels better. No chest pain or dyspnea. Has been up to bathroom and denies lightheadedness. Feels tired.  Objective:  Vital Signs in the last 24 hours: Temp:  [97.8 F (36.6 C)-98.5 F (36.9 C)] 98.5 F (36.9 C) (09/11 0622) Pulse Rate:  [68-86] 82  (09/11 0622) Resp:  [15-23] 17  (09/11 0622) BP: (78-101)/(46-70) 101/66 mmHg (09/11 0622) SpO2:  [93 %-98 %] 94 % (09/11 0622)  Intake/Output from previous day:   Physical Exam: Pt is alert and oriented, pale but in NAD HEENT: normal Neck: JVP - normal, carotids 2+= without bruits Lungs: CTA bilaterally CV: RRR without murmur or gallop Abd: soft, NT, Positive BS, no hepatomegaly Ext: no C/C/E, distal pulses intact and equal Skin: warm/dry no rash  Lab Results:  Basename 07/13/12 0432 07/12/12 2022  WBC 13.2* 14.4*  HGB 13.7 13.6  PLT 304 264    Basename 07/13/12 0432 07/12/12 2022  NA 137 136  K 3.8 4.1  CL 104 104  CO2 21 24  GLUCOSE 103* 111*  BUN 17 19  CREATININE 0.87 1.07    Basename 07/13/12 0432 07/12/12 2231  TROPONINI 2.17* 3.46*   Tele: sinus rhythm, no arrhythmia  Assessment/Plan:  Syncope - by history this sounds consistent with a vasovagal event. Typical prodrome of nausea, diaphoresis. His rhythm is stable on tele. Plan - check LifeVest device. I spoke with Dennis Bast from the office and she is going to come over and check the device. Stop lisinopril. Continue los-dose coreg. We discussed need for frequent small meals and plenty of fluids. If next set of cardiac markers is ok, will d/c home today.  [name], M.D. 07/13/2012, 10:17 AM

## 2012-07-13 NOTE — Telephone Encounter (Signed)
Pt readmitted to hospital, will attempt call 07/14/12

## 2012-07-13 NOTE — Progress Notes (Signed)
Cardiology Progress Note Patient Name: Curtis Rosales Date of Encounter: 07/13/2012, 9:12 AM     Subjective  No overnight events. Patient reports feeling somewhat weak while ambulating to the bathroom this morning, but denies chest pain, sob, dizziness, palpitations, or nausea.  He reports feeling well when he left the hospital. While waiting in the car as his wife picked up his prescriptions he became dizzy, "nervous feeling", diaphoretic, nauseous and then vomited. After he vomited he passed out. His wife thinks he lost consciousness for ~1-45mins before waking up. She then gave him a NTG while waiting on EMS arrival. He denies chest pain or palpitations prior to or after the syncopal episode and reports feeling well when he woke up. No lifevest discharge.   Objective   Telemetry: Sinus rhythm 80s, no arrythmia's   Medications: . aspirin EC  81 mg Oral Daily  . omeprazole  20 mg Oral Daily  . ondansetron (ZOFRAN) IV  4 mg Intravenous Once  . prasugrel  10 mg Oral Daily  . simvastatin  20 mg Oral q1800  . sodium chloride  1,000 mL Intravenous Once    Physical Exam: Temp:  [97.8 F (36.6 C)-98.5 F (36.9 C)] 98.5 F (36.9 C) (09/11 0622) Pulse Rate:  [68-86] 82  (09/11 0622) Resp:  [15-23] 17  (09/11 0622) BP: (78-101)/(46-72) 101/66 mmHg (09/11 0622) SpO2:  [93 %-98 %] 94 % (09/11 0622)  General: Well developed middle aged white male, in no acute distress. Head: Normocephalic, atraumatic, sclera non-icteric, nares are without discharge.  Neck: Supple. Negative for carotid bruits or JVD Lungs: Clear bilaterally to auscultation without wheezes, rales, or rhonchi. Breathing is unlabored. Heart: Lifevest in place. RRR S1 S2 without murmurs, rubs, or gallops.  Abdomen: Soft, non-tender, non-distended with normoactive bowel sounds. No rebound/guarding. No obvious abdominal masses. Msk:  Strength and tone appear normal for age. Extremities: No edema. No clubbing or cyanosis. Distal  pedal pulses are intact and equal bilaterally. Neuro: Alert and oriented X 3. Moves all extremities spontaneously. Psych:  Responds to questions appropriately with a normal affect.   Labs:  [facility] 07/13/12 0432 07/12/12 2022  NA 137 136  K 3.8 4.1  CL 104 104  CO2 21 24  GLUCOSE 103* 111*  BUN 17 19  CREATININE 0.87 1.07  CALCIUM 9.3 9.2   Basename 07/13/12 0432  AST 26  ALT 29  ALKPHOS 74  BILITOT 0.9  PROT 6.4  ALBUMIN 3.1*   Basename 07/13/12 0432 07/12/12 2022  WBC 13.2* 14.4*  NEUTROABS 9.2* 10.9*  HGB 13.7 13.6  HCT 40.1 39.9  MCV 80.8 81.1  PLT 304 264   Basename 07/13/12 0432 07/12/12 2231 07/12/12 2022  TROPONINI 2.17* 3.46* 3.90*    Radiology/Studies:   07/08/2012 - Portable Chest X-ray 1 View Findings: Shallow inspiration.  Normal heart size and pulmonary vascularity.  Slightly increased opacity in the right upper lung may represent infiltration or atelectasis.  No blunting of costophrenic angles.  No pneumothorax.  Mediastinal contours appear intact.  There is a linear structure projected over the left chest which may represent a PICC line.  If so, this is an atypical position. If no PICC line has been placed, and this may represent EKG lead.  Clinical correlation recommended.  IMPRESSION: Mild increased density in the right upper lung may represent infiltration or atelectasis     Assessment and Plan   1. Syncope, ? Arrythmia vs vasovagal in the setting of bradycardia and  hypotension, no lifevest discharge 2. Bradycardia improved off BB  3. Hypotension improved w/ IVF and dc of BB/ACEI 4. CAD w/ recent Anterolateral MI s/p DES to LAD 07/08/12, otherwise nonobstructive dz 5. 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 Ventricular Fibrillation in the setting of MI, Dc'd with lifevest yesterday for primary prevention of SCD  Patient was discharged home yesterday in stable condition after being hospitalized and treated for acute anterolateral MI with DES to LAD on  07/08/12. He was discharged with a lifevest given VF arrest with post-perfusion ventricular arrhythmias. During the hospitalization his med titration was limited by soft BPs (SBP 90-100s), so he was dc'd on 3.125mg  Coreg and 2.5mg  Lisinopril. On his way home he stopped to get prescriptions filled and when his wife returned to the car he complained of dizziness and nausea, then vomited and passed out. No chest pain or palpitations prior to or after the syncopal event. No lifevest discharge. EMS noted bradycardia with rates in the 40s and hypotension (SBP 70-80s). EKG without acute ST/T changes. Troponin 3.9 --> 2.46 (was >20 after MI). SBPs 90-100s after IVF and holding of BB/ACEI. Heart rates improved. Awaiting formal lifevest interrogation report, but if the vest did not discharge it is unlikely this was r/t an arryhthmia. Will discuss plans with Dr. Excell Seltzer.   Signed, HOPE, JESSICA PA-C  Addendum: LifeVest interrogated. No arrhythmic events recorded and no heart rates < 40bpm.   Patient seen, examined. Available data reviewed. Agree with findings, assessment, and plan as outlined by [facility], PA-C. See my note this same date.  [name], M.D. 07/13/2012 3:40 PM

## 2012-07-14 NOTE — ED Provider Notes (Signed)
I saw and evaluated the patient, reviewed the resident's note and I agree with the findings and plan.  I personally reviewed the patient's EKG.  51 year old male with chest pain. Extremely concerning for ACS. V. fib arrest route to the hospital. Electrocardioversion. Received amiodarone. Aspirin prehospital. On arrival patient alert and mentating fine. Complaining of continued chest pain. Ill appearing. EKG not consistent with STEMI but clinically this is ACS. Heparin drip and Plavix. Cardiology was consulted and the patient immediately to the Cath Lab  Raeford Razor, MD 07/14/12 0320

## 2012-07-14 NOTE — Telephone Encounter (Signed)
Pt doing well, no complaints currently, denies CP/ SOB. Reviewed sodium intake/ daily weights/ 3-5 lb weight gain rule/ medications/ follow up app confirmed with both pt and wife. Both verbally confirmed understanding. Told to call with any questions or concerns.

## 2012-07-19 ENCOUNTER — Encounter: Payer: Self-pay | Admitting: Physician Assistant

## 2012-07-19 ENCOUNTER — Ambulatory Visit (INDEPENDENT_AMBULATORY_CARE_PROVIDER_SITE_OTHER): Payer: Medicaid Other | Admitting: Physician Assistant

## 2012-07-19 VITALS — BP 90/72 | HR 74 | Ht 71.0 in | Wt 187.8 lb

## 2012-07-19 DIAGNOSIS — I4901 Ventricular fibrillation: Secondary | ICD-10-CM

## 2012-07-19 DIAGNOSIS — I2510 Atherosclerotic heart disease of native coronary artery without angina pectoris: Secondary | ICD-10-CM

## 2012-07-19 DIAGNOSIS — I2589 Other forms of chronic ischemic heart disease: Secondary | ICD-10-CM

## 2012-07-19 DIAGNOSIS — I2109 ST elevation (STEMI) myocardial infarction involving other coronary artery of anterior wall: Secondary | ICD-10-CM

## 2012-07-19 NOTE — Progress Notes (Signed)
[address] [place], [place]  [address] Phone: [phone] Fax:  [phone]  Date:  07/19/2012   Name:  Deano Parello   DOB:  August 03, 1961   MRN:  [id number]  PCP:  Ambrose Mantle, MD  Primary Cardiologist:  Dr. [name]  Primary Electrophysiologist:  None    History of Present Illness: Mariusz Shirazi is a 51 y.o. male who returns for post hospital follow up.  He was admitted 9/6-9/10 with anterolateral STEMI complicated by VFib arrest in the field requiring defibrillation x 1. Emergent LHC 07/08/12: pLAD occluded, proximal AV circumflex 30%, anterolateral and apical HK, EF 30%. PCI: Promus DES to the pLAD. Echocardiogram 07/11/12: EF 25-30% with severe HK of the mid anterior, basal inferior and septal walls, apical, distal posterior, mid/distal inferior, distal anterior, distal lateral AK, grade 1 diastolic dysfunction, mild MR. Patient did have NSVT along with AIVR post intervention. Low-dose beta blocker and ACE inhibitor were both added to his medical regimen. Patient was placed on LifeVest for primary prevention of sudden cardiac death. The patient will need a repeat echocardiogram in 3 months to reassess his LV function. Patient was readmitted 9/10-9/11 with syncope. He did not receive a shock from his LifeVest. Patient was noted to have some bradycardia and hypotension with a blood pressure 78/46. CK-MBs were negative. Troponins continued to decrease in a downward trend. ACE inhibitor was held due to hypotension. His syncopal event was felt to represent a vasovagal episode.  Doing well since d/c from the hospital.  Denies chest pain, syncope, near syncope, significant dyspnea, orthopnea, PND, edema.  He notes some fatigue.    Wt Readings from Last 3 Encounters:  07/19/12 187 lb 12.8 oz (85.186 kg)  07/10/12 186 lb 15.2 oz (84.8 kg)  07/10/12 186 lb 15.2 oz (84.8 kg)     Past Medical History  Diagnosis Date  . CAD (coronary artery disease)     a. 07/2012  Anterolateral MI/Cath: LAD 100 100%-> 3.0x16 Promus Element DES, otw nonobs dzs, EF 30%  . Ischemic cardiomyopathy     a. 07/2012 Echo: EF 25-30% w/ mid ant, basal inf, sept Sev HK & apical, dist post, mid/dist inf, dist ant, dist lat AK. Gr 1 DD, mild MR.  . VF (ventricular fibrillation)     a. in setting of MI 07/2012  . MI (myocardial infarction)     Current Outpatient Prescriptions  Medication Sig Dispense Refill  . aspirin EC 81 MG tablet Take 1 tablet (81 mg total) by mouth daily.      . carvedilol (COREG) 3.125 MG tablet Take 1 tablet (3.125 mg total) by mouth 2 (two) times daily with a meal.  60 tablet  6  . nitroGLYCERIN (NITROSTAT) 0.4 MG SL tablet Place 1 tablet (0.4 mg total) under the tongue every 5 (five) minutes x 3 doses as needed for chest pain.  25 tablet  3  . omeprazole (PRILOSEC OTC) 20 MG tablet Take 20 mg by mouth daily.      . prasugrel (EFFIENT) 10 MG TABS Take 1 tablet (10 mg total) by mouth daily.  30 tablet  6  . pravastatin (PRAVACHOL) 40 MG tablet Take 1 tablet (40 mg total) by mouth daily.  30 tablet  6    Allergies: No Known Allergies  History  Substance Use Topics  . Smoking status: Never Smoker   . Smokeless tobacco: Never Used  . Alcohol Use: No     ROS:  Please see the  history of present illness.     All other systems reviewed and negative.   PHYSICAL EXAM: VS:  BP 90/72  Pulse 74  Ht 5\' 11"  (1.803 m)  Wt 187 lb 12.8 oz (85.186 kg)  BMI 26.19 kg/m2 Well nourished, well developed, in no acute distress with LifeVest in place HEENT: normal Neck: no JVD Cardiac:  normal S1, S2; RRR; no murmur Lungs:  clear to auscultation bilaterally, no wheezing, rhonchi or rales Abd: soft, nontender, no hepatomegaly Ext: no edema; right groin without hematoma or bruit  Skin: warm and dry Neuro:  CNs 2-12 intact, no focal abnormalities noted  EKG:  NSR, HR 74, normal axis, TW inversions 1, aVL, V2-5, Qs V1-2, no change from prior tracing  ASSESSMENT AND  PLAN:  1. Coronary Artery Disease:  Doing well post STEMI tx with DES to the LAD.  We discussed the importance of dual antiplatelet therapy. He was told to wait on cardiac rehab for 4 weeks.  He has a follow up with Dr. [name] in October and he should keep that appointment.  2. Ischemic Cardiomyopathy:  BP too soft to advance Rx or add ACE inhibitor back.  I would recommend he remain on carvedilol rather than switch him to Toprol.  He seems to be fairly stable now.  Will continue current Rx.  Follow up as planned.  Echo to be repeated in 3 mos.  3. Ventricular Fibrillation Arrest in setting of AL STEMI:  He continues to wear his LifeVest.  4. Hyperlipidemia:  Continue pravastatin.    Luna Glasgow, PA-C  12:58 PM 07/19/2012

## 2012-07-19 NOTE — Patient Instructions (Addendum)
Your physician recommends that you continue on your current medications as directed. Please refer to the Current Medication list given to you today.  Keep appointment with Dr. Excell Seltzer

## 2012-08-04 ENCOUNTER — Telehealth: Payer: Self-pay | Admitting: *Deleted

## 2012-08-04 NOTE — Telephone Encounter (Signed)
Spoke with pt's wife and gave her this information. I explained that pt has been approved for one year in assistance program and that he will receive  4 month supply three times during the year.   I told wife to call us when pt opens 4th bottle so we can reorder it for him.  She is aware it will be sent to office.  Pt will pick up first shipment when here for appt with Dr. Excell Seltzer on October 8.

## 2012-08-04 NOTE — Telephone Encounter (Signed)
F/u  ° °Patient returning nurse call  °

## 2012-08-04 NOTE — Telephone Encounter (Signed)
Effient from [facility] received in office. I called pt to give him this information. Left message to call back. Pt has upcoming appt with Dr. Excell Seltzer on August 09, 2012.

## 2012-08-09 ENCOUNTER — Encounter: Payer: Self-pay | Admitting: *Deleted

## 2012-08-09 ENCOUNTER — Ambulatory Visit (INDEPENDENT_AMBULATORY_CARE_PROVIDER_SITE_OTHER): Payer: Medicaid Other | Admitting: Cardiovascular Disease

## 2012-08-09 ENCOUNTER — Encounter: Payer: Self-pay | Admitting: Cardiovascular Disease

## 2012-08-09 VITALS — BP 106/73 | HR 91 | Ht 71.0 in | Wt 192.0 lb

## 2012-08-09 DIAGNOSIS — I2589 Other forms of chronic ischemic heart disease: Secondary | ICD-10-CM

## 2012-08-09 DIAGNOSIS — I25810 Atherosclerosis of coronary artery bypass graft(s) without angina pectoris: Secondary | ICD-10-CM

## 2012-08-09 DIAGNOSIS — I255 Ischemic cardiomyopathy: Secondary | ICD-10-CM

## 2012-08-09 NOTE — Progress Notes (Signed)
HPI:  51 year old gentleman presenting for followup evaluation. The patient has coronary artery disease and presented one month ago with an anterolateral ST elevation infarction complicated by ventricular fibrillation arrest. He underwent PCI of the LAD with a drug-eluting stent platform. He has severe residual LV dysfunction with an ejection fraction less than 30%. He was discharged home with a life vest. His blood pressure has been to load at ACE inhibitor, and he has been continued on low-dose carvedilol.  The patient is doing very well. He complains of some aching and weakness in his upper legs, but describes this as mild. He also has some calf discomfort, unrelated to physical exertion. He's had no chest pain, dyspnea, orthopnea, PND, palpitations, lightheadedness, or syncope. He's been compliant with his medical program.  Outpatient Encounter Prescriptions as of 08/09/2012  Medication Sig Dispense Refill  . aspirin EC 81 MG tablet Take 1 tablet (81 mg total) by mouth daily.      . carvedilol (COREG) 3.125 MG tablet Take 1 tablet (3.125 mg total) by mouth 2 (two) times daily with a meal.  60 tablet  6  . nitroGLYCERIN (NITROSTAT) 0.4 MG SL tablet Place 1 tablet (0.4 mg total) under the tongue every 5 (five) minutes x 3 doses as needed for chest pain.  25 tablet  3  . omeprazole (PRILOSEC OTC) 20 MG tablet Take 20 mg by mouth daily.      . prasugrel (EFFIENT) 10 MG TABS Take 1 tablet (10 mg total) by mouth daily.  30 tablet  6  . pravastatin (PRAVACHOL) 40 MG tablet Take 1 tablet (40 mg total) by mouth daily.  30 tablet  6    No Known Allergies  Past Medical History  Diagnosis Date  . CAD (coronary artery disease)     a. 07/2012 Anterolateral MI/Cath: LAD 100 100%-> 3.0x16 Promus Element DES, otw nonobs dzs, EF 30%  . Ischemic cardiomyopathy     a. 07/2012 Echo: EF 25-30% w/ mid ant, basal inf, sept Sev HK & apical, dist post, mid/dist inf, dist ant, dist lat AK. Gr 1 DD, mild MR.  . VF  (ventricular fibrillation)     a. in setting of MI 07/2012  . MI (myocardial infarction)     ROS: Negative except as per HPI  BP 106/73  Pulse 91  Ht 5\' 11"  (1.803 m)  Wt 87.091 kg (192 lb)  BMI 26.78 kg/m2  PHYSICAL EXAM: Pt is alert and oriented, NAD HEENT: normal Neck: JVP - normal, carotids 2+= without bruits Lungs: CTA bilaterally CV: RRR without murmur or gallop Abd: soft, NT, Positive BS, no hepatomegaly Ext: no C/C/E, distal pulses intact and equal Skin: warm/dry no rash  EKG:  Normal sinus rhythm 83 beats per minute, possible age indeterminate septal infarct, otherwise within normal limits  ASSESSMENT AND PLAN: 1. Coronary artery disease status post recent anterior wall MI. I am encouraged by the patient's lack of cardiopulmonary symptoms and normalization of his EKG. He is about 30 days out from his infarct and I'm going to recheck an echocardiogram. If his LV function has recovered he can stop wearing his life vest. He will continue on his current medical program. If he continues to have significant LV dysfunction, I would be inclined to start him on a low-dose ACE inhibitor. However, his blood pressure remains low and I am a little reluctant to do this without seeing his LV function first.  2. Hyperlipidemia. The patient is likely having some myalgias related to  his statin drug. There is significant benefit gained from continuing on a statin especially in the early post MI period. His symptoms are mild and he is willing to continue at this point. I will schedule him for lipids and LFTs.  3. Cardiomyopathy. The patient is tolerating only a low-dose of carvedilol. Will consider starting an ACE inhibitor depending on his echo results.  For followup, I would like to see the patient back in 2 months. We discussed several issues today including returning to work and progressively increasing his activities. I would like to see his echo report before committing to her return to  work date.  [name] 08/09/2012 5:57 PM

## 2012-08-09 NOTE — Patient Instructions (Addendum)
Your physician has requested that you have an echocardiogram. Echocardiography is a painless test that uses sound waves to create images of your heart. It provides your doctor with information about the size and shape of your heart and how well your heart's chambers and valves are working. This procedure takes approximately one hour. There are no restrictions for this procedure.  Your physician recommends that you return for a FASTING lipid profile: same day as echo- cmet lipid  Your physician recommends that you schedule a follow-up appointment in: 2 months

## 2012-08-10 NOTE — Addendum Note (Signed)
Addended by: Kem Parkinson on: 08/10/2012 09:01 AM   Modules accepted: Orders

## 2012-08-12 DIAGNOSIS — I4901 Ventricular fibrillation: Secondary | ICD-10-CM

## 2012-08-12 DIAGNOSIS — I2589 Other forms of chronic ischemic heart disease: Secondary | ICD-10-CM

## 2012-08-12 DIAGNOSIS — I2109 ST elevation (STEMI) myocardial infarction involving other coronary artery of anterior wall: Secondary | ICD-10-CM

## 2012-08-12 DIAGNOSIS — I2510 Atherosclerotic heart disease of native coronary artery without angina pectoris: Secondary | ICD-10-CM

## 2012-08-19 ENCOUNTER — Other Ambulatory Visit (INDEPENDENT_AMBULATORY_CARE_PROVIDER_SITE_OTHER): Payer: Medicaid Other

## 2012-08-19 ENCOUNTER — Ambulatory Visit (HOSPITAL_COMMUNITY): Payer: Medicaid Other | Attending: Cardiology

## 2012-08-19 DIAGNOSIS — I25810 Atherosclerosis of coronary artery bypass graft(s) without angina pectoris: Secondary | ICD-10-CM

## 2012-08-19 DIAGNOSIS — I059 Rheumatic mitral valve disease, unspecified: Secondary | ICD-10-CM | POA: Insufficient documentation

## 2012-08-19 DIAGNOSIS — I252 Old myocardial infarction: Secondary | ICD-10-CM | POA: Insufficient documentation

## 2012-08-19 DIAGNOSIS — I2510 Atherosclerotic heart disease of native coronary artery without angina pectoris: Secondary | ICD-10-CM | POA: Insufficient documentation

## 2012-08-19 DIAGNOSIS — I255 Ischemic cardiomyopathy: Secondary | ICD-10-CM

## 2012-08-19 DIAGNOSIS — I2589 Other forms of chronic ischemic heart disease: Secondary | ICD-10-CM

## 2012-08-19 DIAGNOSIS — I517 Cardiomegaly: Secondary | ICD-10-CM | POA: Insufficient documentation

## 2012-08-19 LAB — COMPREHENSIVE METABOLIC PANEL
ALT: 25 U/L (ref 0–53)
CO2: 27 mEq/L (ref 19–32)
Creatinine, Ser: 1 mg/dL (ref 0.4–1.5)
GFR: 86.81 mL/min (ref >60.00)
Glucose, Bld: 95 mg/dL (ref 70–99)
Total Bilirubin: 0.6 mg/dL (ref 0.3–1.2)

## 2012-08-19 LAB — LIPID PANEL
Cholesterol: 159 mg/dL (ref 0–200)
Total CHOL/HDL Ratio: 5
Triglycerides: 231 mg/dL — ABNORMAL HIGH (ref 0.0–149.0)

## 2012-08-19 LAB — LDL CHOLESTEROL, DIRECT: Direct LDL: 78.7 mg/dL

## 2012-08-19 NOTE — Progress Notes (Signed)
Echocardiogram performed.  

## 2012-10-03 ENCOUNTER — Ambulatory Visit (INDEPENDENT_AMBULATORY_CARE_PROVIDER_SITE_OTHER): Payer: Medicaid Other | Admitting: Cardiovascular Disease

## 2012-10-03 ENCOUNTER — Encounter: Payer: Self-pay | Admitting: Cardiovascular Disease

## 2012-10-03 VITALS — BP 102/76 | HR 94 | Ht 67.0 in | Wt 198.0 lb

## 2012-10-03 DIAGNOSIS — I2109 ST elevation (STEMI) myocardial infarction involving other coronary artery of anterior wall: Secondary | ICD-10-CM

## 2012-10-03 NOTE — Patient Instructions (Addendum)
Your physician has requested that you have an echocardiogram. Echocardiography is a painless test that uses sound waves to create images of your heart. It provides your doctor with information about the size and shape of your heart and how well your heart's chambers and valves are working. This procedure takes approximately one hour. There are no restrictions for this procedure. To be done just before next office visit.  Your physician recommends that you continue on your current medications as directed. Please refer to the Current Medication list given to you today.  Your physician recommends that you schedule a follow-up appointment in: January 06, 2013

## 2012-10-03 NOTE — Progress Notes (Signed)
HPI:  51 year old gentleman presenting for followup evaluation. The patient has coronary artery disease and he initially presented in September with an anterolateral STEMI complicated by V. fib arrest. He underwent primary PCI with stenting of the LAD utilizing a drug-eluting stent. He wore a life vest, but followup echo demonstrated recovery of LV function now with LV ejection fraction in the range of 40-45%.  The patient is doing well. He has no complaints today. 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 He denies chest pain, chest pressure, dyspnea, lightheadedness, or palpitations. He's been compliant with his medications.  Outpatient Encounter Prescriptions as of 10/03/2012  Medication Sig Dispense Refill  . aspirin EC 81 MG tablet Take 1 tablet (81 mg total) by mouth daily.      . carvedilol (COREG) 3.125 MG tablet Take 1 tablet (3.125 mg total) by mouth 2 (two) times daily with a meal.  60 tablet  6  . nitroGLYCERIN (NITROSTAT) 0.4 MG SL tablet Place 1 tablet (0.4 mg total) under the tongue every 5 (five) minutes x 3 doses as needed for chest pain.  25 tablet  3  . omeprazole (PRILOSEC OTC) 20 MG tablet Take 20 mg by mouth daily.      . prasugrel (EFFIENT) 10 MG TABS Take 1 tablet (10 mg total) by mouth daily.  30 tablet  6  . pravastatin (PRAVACHOL) 40 MG tablet Take 1 tablet (40 mg total) by mouth daily.  30 tablet  6    No Known Allergies  Past Medical History  Diagnosis Date  . CAD (coronary artery disease)     a. 07/2012 Anterolateral MI/Cath: LAD 100 100%-> 3.0x16 Promus Element DES, otw nonobs dzs, EF 30%  . Ischemic cardiomyopathy     a. 07/2012 Echo: EF 25-30% w/ mid ant, basal inf, sept Sev HK & apical, dist post, mid/dist inf, dist ant, dist lat AK. Gr 1 DD, mild MR.  . VF (ventricular fibrillation)     a. in setting of MI 07/2012  . MI (myocardial infarction)     ROS: Negative except as per HPI  BP 102/76  Pulse 94  Ht 5\' 7"  (1.702 m)  Wt 89.812 kg (198 lb)   BMI 31.01 kg/m2  SpO2 97%  PHYSICAL EXAM: Pt is alert and oriented, NAD HEENT: normal Neck: JVP - normal, carotids 2+= without bruits Lungs: CTA bilaterally CV: RRR without murmur or gallop Abd: soft, NT, Positive BS, no hepatomegaly Ext: no C/C/E, distal pulses intact and equal Skin: warm/dry no rash  2D Echo: Study Conclusions  - Left ventricle: The cavity size was normal. Wall thickness was increased in a pattern of mild LVH. There was mild focal basal hypertrophy of the septum. Systolic function was mildly to moderately reduced. The estimated ejection fraction was in the range of 40% to 45%. There is severe hypokinesis of the anteroseptal myocardium. Doppler parameters are consistent with abnormal left ventricular relaxation (grade 1 diastolic dysfunction). - Mitral valve: Mild regurgitation. - Left atrium: The atrium was mildly dilated.  ASSESSMENT AND PLAN: 1. Coronary artery disease status post anterior myocardial infarction. The patient is stable without symptoms of angina or congestive heart failure. He should remain on dual antiplatelet therapy with aspirin and effient. I am not able to push it is medications any further because of low blood pressure. He will was intolerant to a very low dose of lisinopril and had near-syncope with this. I would like to repeat his echocardiogram in about 4  months to assess his LV recovery. I will see him back in followup after that echocardiogram is completed.  2. Hyperlipidemia. The patient is on pravastatin. He reported myalgias at the time of his last visit but these have resolved. Limited resources are an issue and we will keep him on pravastatin at this time. His LDL in October was 78.  [name] 10/03/2012 10:22 AM

## 2012-11-10 ENCOUNTER — Telehealth: Payer: Self-pay | Admitting: Cardiovascular Disease

## 2012-11-10 NOTE — Telephone Encounter (Signed)
New problem;  Samples of effient  10 mg.   

## 2012-11-10 NOTE — Telephone Encounter (Signed)
Pt advised that we are currently out of Effient samples at this time and for him to call us back before 5 tomorrow to see if we have received any, he verbalized understanding.

## 2012-11-15 ENCOUNTER — Telehealth: Payer: Self-pay | Admitting: Cardiovascular Disease

## 2012-11-15 NOTE — Telephone Encounter (Signed)
I spoke with the pt's wife and it is time to reorder the pt's Effient from the assistance program.  I will complete form and fax to the company.

## 2012-11-15 NOTE — Telephone Encounter (Signed)
New Problem:    Patient's wife called in wanting to speak with you about her husband receiving prasugrel (EFFIENT) 10 MG TABS samples for free again for a year.  Please call back.

## 2012-11-29 NOTE — Telephone Encounter (Signed)
Effient arrived in the office and placed at the front desk for pick-up.  Pt aware.

## 2012-12-17 ENCOUNTER — Other Ambulatory Visit: Payer: Self-pay

## 2013-01-03 ENCOUNTER — Ambulatory Visit (HOSPITAL_COMMUNITY): Payer: Medicaid Other | Attending: Cardiovascular Disease | Admitting: Radiology

## 2013-01-03 DIAGNOSIS — I2119 ST elevation (STEMI) myocardial infarction involving other coronary artery of inferior wall: Secondary | ICD-10-CM

## 2013-01-03 DIAGNOSIS — I2109 ST elevation (STEMI) myocardial infarction involving other coronary artery of anterior wall: Secondary | ICD-10-CM | POA: Insufficient documentation

## 2013-01-03 NOTE — Progress Notes (Signed)
Echocardiogram performed.  

## 2013-01-06 ENCOUNTER — Ambulatory Visit: Payer: Self-pay | Admitting: Cardiovascular Disease

## 2013-01-10 ENCOUNTER — Ambulatory Visit (INDEPENDENT_AMBULATORY_CARE_PROVIDER_SITE_OTHER): Payer: Medicaid Other | Admitting: Cardiovascular Disease

## 2013-01-10 ENCOUNTER — Encounter: Payer: Self-pay | Admitting: Cardiovascular Disease

## 2013-01-10 VITALS — BP 124/86 | HR 85 | Ht 71.0 in | Wt 202.8 lb

## 2013-01-10 DIAGNOSIS — I2510 Atherosclerotic heart disease of native coronary artery without angina pectoris: Secondary | ICD-10-CM

## 2013-01-10 DIAGNOSIS — I252 Old myocardial infarction: Secondary | ICD-10-CM

## 2013-01-10 DIAGNOSIS — E7800 Pure hypercholesterolemia, unspecified: Secondary | ICD-10-CM

## 2013-01-10 NOTE — Progress Notes (Signed)
HPI:  52 year old gentleman presenting for followup evaluation. The patient has coronary artery disease. He initially presented in September 2013 with an anterolateral ST elevation MI complicated by out-of-hospital V. fib arrest. He underwent primary PCI with stenting of the LAD. A drug-eluting stent platform was chosen. Initially he had severe LV dysfunction, but his left ventricular ejection fraction has improved.  Overall he is doing very well. He is back to work about 6 hours per day. He's not had any chest pain, chest pressure, dyspnea, palpitations, lightheadedness, or syncope. He has mild fatigue, but otherwise no specific complaints. 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  Outpatient Encounter Prescriptions as of 01/10/2013  Medication Sig Dispense Refill  . aspirin EC 81 MG tablet Take 1 tablet (81 mg total) by mouth daily.      . carvedilol (COREG) 3.125 MG tablet Take 1 tablet (3.125 mg total) by mouth 2 (two) times daily with a meal.  60 tablet  6  . nitroGLYCERIN (NITROSTAT) 0.4 MG SL tablet Place 1 tablet (0.4 mg total) under the tongue every 5 (five) minutes x 3 doses as needed for chest pain.  25 tablet  3  . omeprazole (PRILOSEC OTC) 20 MG tablet Take 20 mg by mouth daily.      . prasugrel (EFFIENT) 10 MG TABS Take 1 tablet (10 mg total) by mouth daily.  30 tablet  6  . pravastatin (PRAVACHOL) 40 MG tablet Take 1 tablet (40 mg total) by mouth daily.  30 tablet  6   No facility-administered encounter medications on file as of 01/10/2013.    No Known Allergies  Past Medical History  Diagnosis Date  . CAD (coronary artery disease)     a. 07/2012 Anterolateral MI/Cath: LAD 100 100%-> 3.0x16 Promus Element DES, otw nonobs dzs, EF 30%  . Ischemic cardiomyopathy     a. 07/2012 Echo: EF 25-30% w/ mid ant, basal inf, sept Sev HK & apical, dist post, mid/dist inf, dist ant, dist lat AK. Gr 1 DD, mild MR.  . VF (ventricular fibrillation)     a. in setting of MI 07/2012  . MI (myocardial  infarction)     ROS: Negative except as per HPI  BP 124/86  Pulse 85  Ht 5\' 11"  (1.803 m)  Wt 91.989 kg (202 lb 12.8 oz)  BMI 28.3 kg/m2  SpO2 97%  PHYSICAL EXAM: Pt is alert and oriented, NAD HEENT: normal Neck: JVP - normal, carotids 2+= without bruits Lungs: CTA bilaterally CV: RRR without murmur or gallop Abd: soft, NT, Positive BS, no hepatomegaly Ext: no C/C/E, distal pulses intact and equal Skin: warm/dry no rash  EKG:  Normal sinus rhythm, within normal limits.  2D Echo: Left ventricle: The cavity size was normal. Wall thickness was increased in a pattern of mild LVH. There was mild focal basal hypertrophy of the septum. Systolic function was normal. The estimated ejection fraction was in the range of 50% to 55%. Regional wall motion abnormalities: There is mild hypokinesis of the basal-midanteroseptal myocardium. There is hypokinesis of the mid-distalinferior myocardium. Doppler parameters are consistent with abnormal left ventricular relaxation (grade 1 diastolic dysfunction).  ------------------------------------------------------------ Aortic valve: Trileaflet; normal thickness leaflets. Mobility was not restricted. Doppler: Transvalvular velocity was within the normal range. There was no stenosis. No regurgitation.  ------------------------------------------------------------ Aorta: Aortic root: The aortic root was normal in size.  ------------------------------------------------------------ Mitral valve: Structurally normal valve. Mobility was not restricted. Doppler: Transvalvular velocity was within the normal range. There was  no evidence for stenosis. Trivial regurgitation.  ------------------------------------------------------------ Left atrium: The atrium was mildly dilated.  ------------------------------------------------------------ Right ventricle: The cavity size was normal. Systolic function was  normal.  ------------------------------------------------------------ Pulmonic valve: Doppler: Transvalvular velocity was within the normal range. There was no evidence for stenosis.  ------------------------------------------------------------ Tricuspid valve: Structurally normal valve. Doppler: Transvalvular velocity was within the normal range. Trivial regurgitation.  ------------------------------------------------------------ Right atrium: The atrium was normal in size.  ------------------------------------------------------------ Pericardium: There was no pericardial effusion.  ASSESSMENT AND PLAN: 1. Coronary atherosclerosis with anterior myocardial infarction. The patient remained stable. He has had good recovery of LV function. He will continue on aspirin and effient. He has not been able tolerate more aggressive medical therapy because of symptomatic hypotension. Fortunately his LV function has recovered nicely on low-dose carvedilol. Will continue the same. He had syncope with even a very low dose of lisinopril. We had a lengthy discussion about his return to full-time work which I think is appropriate at this time. 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 He understands the increased risk of bleeding problems if he were to sustain a significant injury on a background of dual antiplatelet therapy.  2. Hyperlipidemia. He is tolerating pravastatin. His myalgias have resolved. His LDL in October 2013 with 78. He will continue on the same therapy. I will see him back in 6 months with lipids and LFTs prior to his office visit.

## 2013-01-10 NOTE — Patient Instructions (Addendum)
Your physician wants you to follow-up in: 6 MONTHS with Dr Cooper. You will receive a reminder letter in the mail two months in advance. If you don't receive a letter, please call our office to schedule the follow-up appointment.  Your physician recommends that you return for a FASTING LIPID and LIVER profile in 6 MONTHS--nothing to eat or drink after midnight.   Your physician recommends that you continue on your current medications as directed. Please refer to the Current Medication list given to you today.   

## 2013-02-05 ENCOUNTER — Other Ambulatory Visit (HOSPITAL_COMMUNITY): Payer: Self-pay | Admitting: Nurse Practitioner

## 2013-04-07 ENCOUNTER — Telehealth: Payer: Self-pay | Admitting: Cardiovascular Disease

## 2013-04-07 NOTE — Telephone Encounter (Signed)
Left message to call back Monday and ask for Lauren

## 2013-04-07 NOTE — Telephone Encounter (Signed)
New Problem:     Patient's wife called in wanting to know the status of her husband's paperwork to receive prasugrel (EFFIENT) 10 MG TABS free for a year.  Please call back.

## 2013-04-10 NOTE — Telephone Encounter (Signed)
Follow UP   Pt's wife returning Lauren's call. She states husband is out of the EFFIENT.

## 2013-04-10 NOTE — Telephone Encounter (Signed)
I spoke with the pt's wife and she said the pt took his last pill of Effient yesterday.  I have placed a 28 day supply of Effient at the front desk for pick up today.  The pt is also already enrolled in the assistance program for Effient.  I will complete form and fax to [employer] to obtain medication.

## 2013-05-01 ENCOUNTER — Telehealth: Payer: Self-pay | Admitting: Cardiovascular Disease

## 2013-05-01 NOTE — Telephone Encounter (Signed)
New Problem:    Patient's wife called in because the patient receives three months supply of his prasugrel (EFFIENT) 10 MG TABS and would like to know when she would be able to pick this up.  Please call back.

## 2013-05-01 NOTE — Telephone Encounter (Signed)
LMTCB

## 2013-05-02 NOTE — Telephone Encounter (Signed)
Medication has not been received in our office.  I have another 28 day supply of samples available and have contacted the assistance program and left a message for them to contact our office about the pt's Effient.  I left a message for the pt's wife to call the office.

## 2013-05-04 NOTE — Telephone Encounter (Signed)
Left message on machine for pt's wife to contact the office.   

## 2013-05-04 NOTE — Telephone Encounter (Signed)
[facility] called and said the pt's refill application was processed on 04/22/13.  The pt's medication should arrive in our office early next week.  If the medication does not arrive then we can call back with reference #[id number] and they will track medication.

## 2013-05-08 NOTE — Telephone Encounter (Signed)
I spoke with the pt's wife and made her aware that samples have been placed at the front desk for pick-up.  Effient has not arrived in our office as of today. I will call the pt's wife back once medication arrives.

## 2013-05-09 NOTE — Telephone Encounter (Signed)
Effient (120 tablets) from assistance program arrived in the office today.  I spoke with the pt and made him aware that this has been placed at the front desk for pick-up.

## 2013-07-17 ENCOUNTER — Other Ambulatory Visit (INDEPENDENT_AMBULATORY_CARE_PROVIDER_SITE_OTHER): Payer: Medicaid Other

## 2013-07-17 DIAGNOSIS — I252 Old myocardial infarction: Secondary | ICD-10-CM

## 2013-07-17 DIAGNOSIS — I2510 Atherosclerotic heart disease of native coronary artery without angina pectoris: Secondary | ICD-10-CM

## 2013-07-17 DIAGNOSIS — E7800 Pure hypercholesterolemia, unspecified: Secondary | ICD-10-CM

## 2013-07-17 LAB — HEPATIC FUNCTION PANEL
ALT: 23 U/L (ref 0–53)
AST: 21 U/L (ref 0–37)
Albumin: 4.1 g/dL (ref 3.5–5.2)
Total Protein: 7.2 g/dL (ref 6.0–8.3)

## 2013-07-17 LAB — LIPID PANEL
HDL: 32.2 mg/dL — ABNORMAL LOW (ref >39.00)
Triglycerides: 198 mg/dL — ABNORMAL HIGH (ref 0.0–149.0)

## 2013-07-20 ENCOUNTER — Ambulatory Visit (INDEPENDENT_AMBULATORY_CARE_PROVIDER_SITE_OTHER): Payer: Medicaid Other | Admitting: Cardiovascular Disease

## 2013-07-20 ENCOUNTER — Encounter: Payer: Self-pay | Admitting: Cardiovascular Disease

## 2013-07-20 VITALS — BP 102/80 | HR 85 | Ht 71.0 in | Wt 190.0 lb

## 2013-07-20 DIAGNOSIS — E7800 Pure hypercholesterolemia, unspecified: Secondary | ICD-10-CM

## 2013-07-20 DIAGNOSIS — I2510 Atherosclerotic heart disease of native coronary artery without angina pectoris: Secondary | ICD-10-CM

## 2013-07-20 MED ORDER — CARVEDILOL 3.125 MG PO TABS: ORAL_TABLET | ORAL | Status: DC

## 2013-07-20 MED ORDER — PRAVASTATIN SODIUM 40 MG PO TABS: ORAL_TABLET | ORAL | Status: DC

## 2013-07-20 MED ORDER — ASPIRIN EC 81 MG PO TBEC: 81.0000 mg | DELAYED_RELEASE_TABLET | Freq: Every day | ORAL | Status: AC

## 2013-07-20 NOTE — Patient Instructions (Addendum)
Your physician has recommended you make the following change in your medication: STOP Effient, Continue Aspirin  Your physician wants you to follow-up in: 6 MONTHS with Dr Excell Seltzer.  You will receive a reminder letter in the mail two months in advance. If you don't receive a letter, please call our office to schedule the follow-up appointment.

## 2013-07-20 NOTE — Progress Notes (Signed)
   HPI:   52 year old gentleman presenting for followup evaluation. The patient is followed for coronary artery disease and he initially presented with an acute anterolateral MI, complicated by out of hospital V. fib arrest one year ago. He underwent primary PCI with stenting of the LAD. He initially had severe LV dysfunction but this completely normalized on followup.  The patient is doing well. He has no chest pain, chest pressure, shortness of breath, edema, palpitations, lightheadedness, or syncope. He does have some generalized fatigue, but continues to work without significant problems.  Outpatient Encounter Prescriptions as of 07/20/2013  Medication Sig Dispense Refill  . carvedilol (COREG) 3.125 MG tablet TAKE ONE TABLET BY MOUTH TWICE DAILY WITH MEALS  60 tablet  5  . nitroGLYCERIN (NITROSTAT) 0.4 MG SL tablet Place 1 tablet (0.4 mg total) under the tongue every 5 (five) minutes x 3 doses as needed for chest pain.  25 tablet  3  . omeprazole (PRILOSEC OTC) 20 MG tablet Take 20 mg by mouth daily.      . prasugrel (EFFIENT) 10 MG TABS Take 1 tablet (10 mg total) by mouth daily.  30 tablet  6  . pravastatin (PRAVACHOL) 40 MG tablet TAKE ONE TABLET BY MOUTH EVERY DAY  30 tablet  5   No facility-administered encounter medications on file as of 07/20/2013.    No Known Allergies  Past Medical History  Diagnosis Date  . CAD (coronary artery disease)     a. 07/2012 Anterolateral MI/Cath: LAD 100 100%-> 3.0x16 Promus Element DES, otw nonobs dzs, EF 30%  . Ischemic cardiomyopathy     a. 07/2012 Echo: EF 25-30% w/ mid ant, basal inf, sept Sev HK & apical, dist post, mid/dist inf, dist ant, dist lat AK. Gr 1 DD, mild MR.  . VF (ventricular fibrillation)     a. in setting of MI 07/2012  . MI (myocardial infarction)     ROS: Negative except as per HPI  BP 102/80  Pulse 85  Ht 5\' 11"  (1.803 m)  Wt 190 lb (86.183 kg)  BMI 26.51 kg/m2  SpO2 95%  PHYSICAL EXAM: Pt is alert and oriented,  NAD HEENT: normal Neck: JVP - normal, carotids 2+= without bruits Lungs: CTA bilaterally CV: RRR without murmur or gallop Abd: soft, NT, Positive BS, no hepatomegaly Ext: no C/C/E, distal pulses intact and equal Skin: warm/dry no rash  EKG:  Normal sinus rhythm 85 per minute, within normal limits.  ASSESSMENT AND PLAN: 1. Coronary artery disease, native vessel. He has stable without anginal symptoms. Now 12 months out from his MI, he will stop effient per guideline recommendations. Needs to remain on aspirin long-term. There were no other medicine changes made today.  2. Hyperlipidemia. Lipid panel was reviewed. Total cholesterol of 150, triglycerides 198, HDL 32, and LDL 78. He will continue on pravastatin 40 mg.  For followup I will see him back in 6 months.  [name] 07/20/2013 12:29 PM

## 2013-07-31 IMAGING — CR DG CHEST 1V PORT
1 series · 1 of 1 positions shown · non-contrast
Comparison: None.

CLINICAL DATA: Anterior emboli.

PORTABLE CHEST - 1 VIEW

[AP]
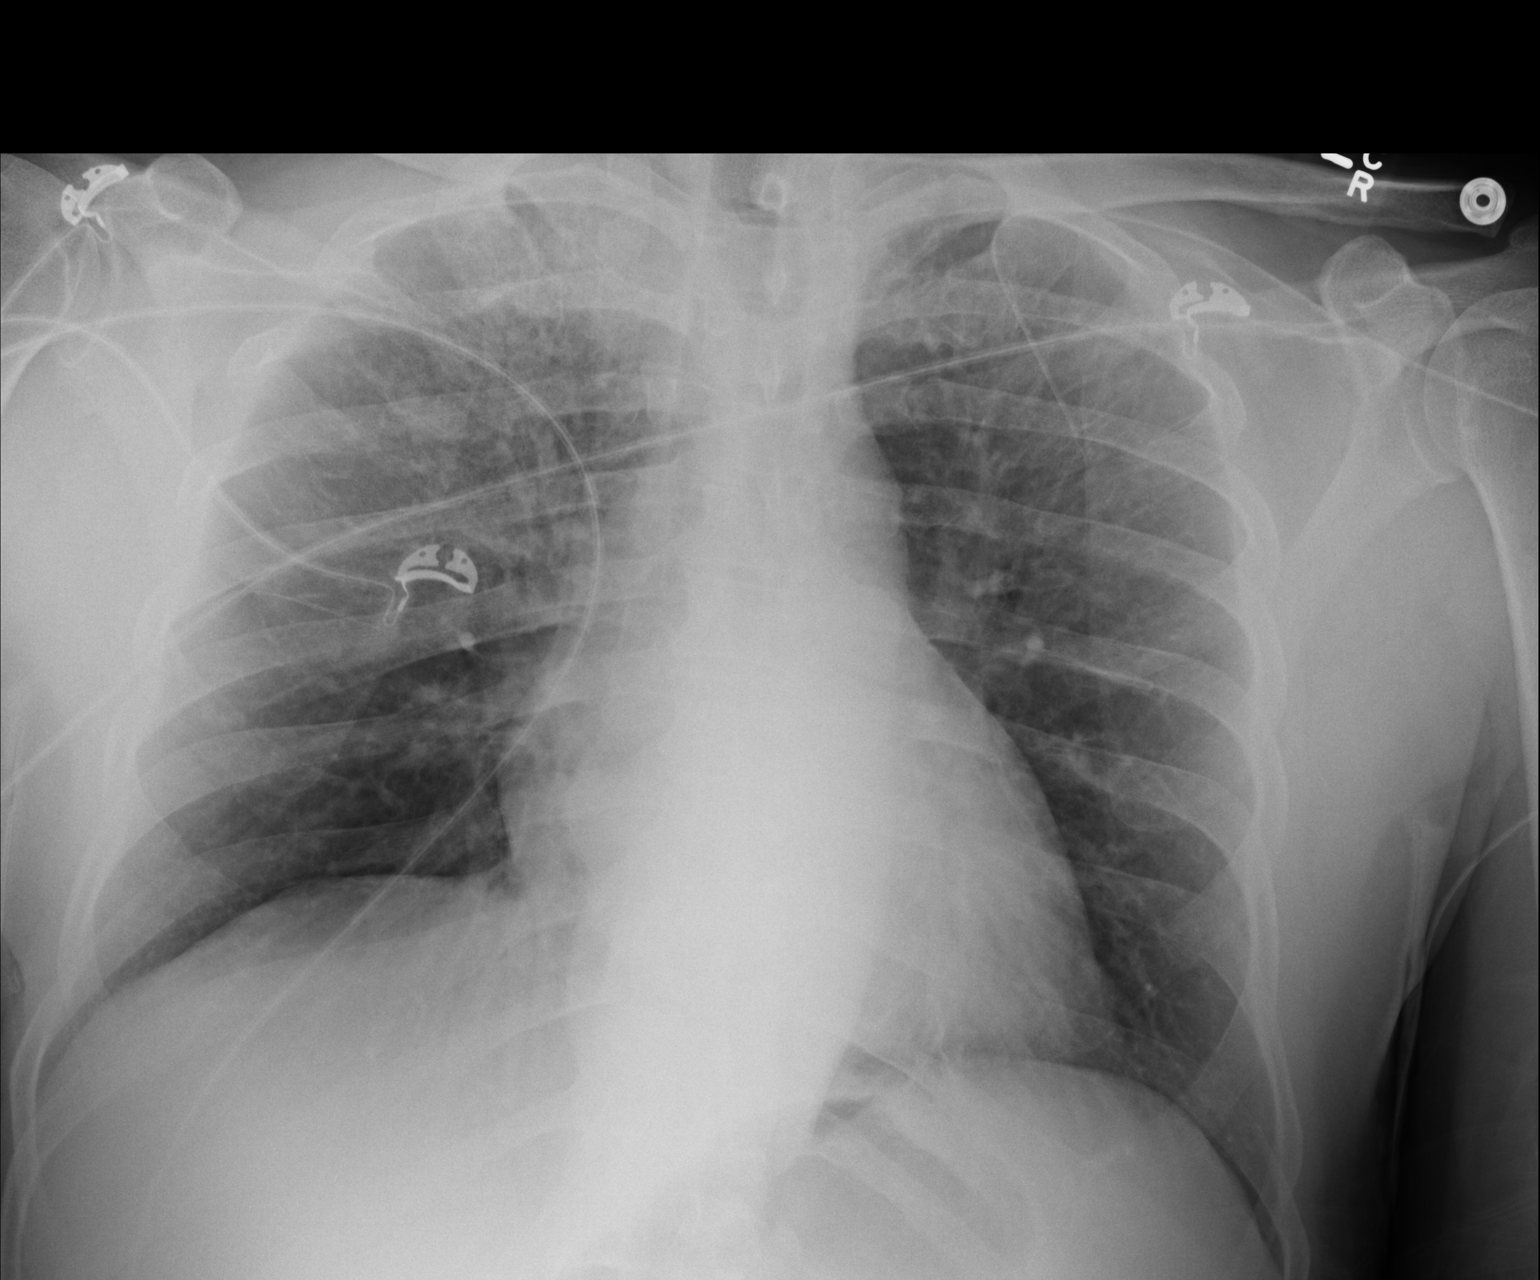

[1 of 1 positions shown; findings below may reference images not displayed]

FINDINGS: Shallow inspiration.  Normal heart size and pulmonary
vascularity.  Slightly increased opacity in the right upper lung
may represent infiltration or atelectasis.  No blunting of
costophrenic angles.  No pneumothorax.  Mediastinal contours appear
intact.  There is a linear structure projected over the left chest
which may represent a PICC line.  If so, this is an atypical
position. If no PICC line has been placed, and this may represent
EKG lead.  Clinical correlation recommended.
IMPRESSION: Mild increased density in the right upper lung may represent
infiltration or atelectasis.

## 2013-09-07 ENCOUNTER — Other Ambulatory Visit: Payer: Self-pay

## 2014-02-23 ENCOUNTER — Encounter: Payer: Self-pay | Admitting: Cardiovascular Disease

## 2014-02-23 ENCOUNTER — Ambulatory Visit (INDEPENDENT_AMBULATORY_CARE_PROVIDER_SITE_OTHER): Payer: Self-pay | Admitting: Cardiovascular Disease

## 2014-02-23 VITALS — BP 102/76 | HR 75 | Ht 71.0 in | Wt 201.1 lb

## 2014-02-23 DIAGNOSIS — I255 Ischemic cardiomyopathy: Secondary | ICD-10-CM

## 2014-02-23 DIAGNOSIS — E785 Hyperlipidemia, unspecified: Secondary | ICD-10-CM

## 2014-02-23 DIAGNOSIS — I2510 Atherosclerotic heart disease of native coronary artery without angina pectoris: Secondary | ICD-10-CM

## 2014-02-23 DIAGNOSIS — I2589 Other forms of chronic ischemic heart disease: Secondary | ICD-10-CM

## 2014-02-23 MED ORDER — NITROGLYCERIN 0.4 MG SL SUBL: 0.4000 mg | SUBLINGUAL_TABLET | SUBLINGUAL | Status: DC | PRN

## 2014-02-23 NOTE — Progress Notes (Signed)
    HPI:  53 year old gentleman presenting for followup evaluation. The patient is followed for coronary artery disease and he initially presented with an acute anterolateral MI, complicated by out of hospital V. fib arrest in 2013. He underwent primary PCI with stenting of the LAD. He initially had severe LV dysfunction but LVEF improved to 50-55% on follow-up echo.  Last lipids September 2014: Lipid Panel     Component Value Date/Time   CHOL 150 07/17/2013 0808   TRIG 198.0* 07/17/2013 0808   HDL 32.20* 07/17/2013 0808   CHOLHDL 5 07/17/2013 0808   VLDL 39.6 07/17/2013 0808   LDLCALC 78 07/17/2013 0808   The patient is doing pretty well. He denies chest pain or pressure. He denies edema, orthopnea, or PND. He does wake up with a "choking feeling" when he sleeps in certain positions. This is worse after certain foods. He complains of afternoon fatigue. No other complaints. He's been compliant with his medications. He walks 1.5 miles with his wife every day and has no symptoms with that level of exertion.  Outpatient Encounter Prescriptions as of 02/23/2014  Medication Sig  . aspirin EC 81 MG tablet Take 1 tablet (81 mg total) by mouth daily.  . carvedilol (COREG) 3.125 MG tablet TAKE ONE TABLET BY MOUTH TWICE DAILY WITH MEALS  . nitroGLYCERIN (NITROSTAT) 0.4 MG SL tablet Place 1 tablet (0.4 mg total) under the tongue every 5 (five) minutes x 3 doses as needed for chest pain.  Marland Kitchen. omeprazole (PRILOSEC OTC) 20 MG tablet Take 20 mg by mouth daily.  . pravastatin (PRAVACHOL) 40 MG tablet TAKE ONE TABLET BY MOUTH EVERY DAY    No Known Allergies  Past Medical History  Diagnosis Date  . CAD (coronary artery disease)     a. 07/2012 Anterolateral MI/Cath: LAD 100 100%-> 3.0x16 Promus Element DES, otw nonobs dzs, EF 30%  . Ischemic cardiomyopathy     a. 07/2012 Echo: EF 25-30% w/ mid ant, basal inf, sept Sev HK & apical, dist post, mid/dist inf, dist ant, dist lat AK. Gr 1 DD, mild MR.  . VF  (ventricular fibrillation)     a. in setting of MI 07/2012  . MI (myocardial infarction)     ROS: Negative except as per HPI  BP 102/76  Pulse 75  Ht 5\' 11"  (1.803 m)  Wt 201 lb 1.9 oz (91.227 kg)  BMI 28.06 kg/m2  PHYSICAL EXAM: Pt is alert and oriented, NAD HEENT: normal Neck: JVP - normal, carotids 2+= without bruits Lungs: CTA bilaterally CV: RRR without murmur or gallop Abd: soft, NT, Positive BS, no hepatomegaly Ext: no C/C/E, distal pulses intact and equal Skin: warm/dry no rash  EKG:  Normal sinus rhythm 75 beats per minute, within normal limits.  ASSESSMENT AND PLAN: 1. Coronary artery disease, native vessel. Stable without symptoms of angina. Recommend continue same medical program. He is greater than one year out from his MI. Recommend an exercise tolerance test.  2. Hyperlipidemia. The patient is on pravastatin. Lipids reviewed as above. Will repeat in September.  I would like to see him back in 6 months for followup evaluation.  Tonny BollmanMichael Hieu Herms 02/23/2014 11:16 AM

## 2014-02-23 NOTE — Patient Instructions (Signed)
Your physician has requested that you have an exercise tolerance test with PA/NP. For further information please visit [email or web address]. Please also follow instruction sheet, as given.  Your physician recommends that you return for a FASTING LIPID and LIVER in September--nothing to eat or drink after midnight, lab opens at 7:30 AM  Your physician wants you to follow-up in: 6 MONTHS with Dr Excell Seltzerooper.  You will receive a reminder letter in the mail two months in advance. If you don't receive a letter, please call our office to schedule the follow-up appointment.  Your physician recommends that you continue on your current medications as directed. Please refer to the Current Medication list given to you today.

## 2014-03-13 ENCOUNTER — Telehealth (HOSPITAL_COMMUNITY): Payer: Self-pay

## 2014-03-14 ENCOUNTER — Telehealth (HOSPITAL_COMMUNITY): Payer: Self-pay

## 2014-03-15 ENCOUNTER — Ambulatory Visit (HOSPITAL_COMMUNITY)
Admission: RE | Admit: 2014-03-15 | Discharge: 2014-03-15 | Disposition: A | Discharge status: Home / Self Care | Payer: Self-pay | Source: Ambulatory Visit | Attending: Cardiology | Admitting: Cardiology

## 2014-03-15 DIAGNOSIS — I255 Ischemic cardiomyopathy: Secondary | ICD-10-CM

## 2014-03-15 DIAGNOSIS — I2589 Other forms of chronic ischemic heart disease: Secondary | ICD-10-CM | POA: Insufficient documentation

## 2014-03-15 DIAGNOSIS — I2510 Atherosclerotic heart disease of native coronary artery without angina pectoris: Secondary | ICD-10-CM | POA: Insufficient documentation

## 2014-03-20 ENCOUNTER — Telehealth: Payer: Self-pay

## 2014-03-20 DIAGNOSIS — R9430 Abnormal result of cardiovascular function study, unspecified: Secondary | ICD-10-CM

## 2014-03-20 NOTE — Telephone Encounter (Signed)
I spoke with the pt about abnormal GXT and have scheduled him for myoview on 04/02/14 at 12:45.  I reviewed instructions with the pt by phone and will also mail a copy of instructions to his home. The pt will have his wife call the office if she has questions.

## 2014-03-20 NOTE — Telephone Encounter (Signed)
Message from Dr Excell Seltzerooper: Leotis ShamesLauren - he had abnormal treadmill. Can you set up for an exercise Myoview? thx

## 2014-03-27 ENCOUNTER — Telehealth: Payer: Self-pay | Admitting: Cardiovascular Disease

## 2014-03-27 NOTE — Telephone Encounter (Signed)
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 Please call wife

## 2014-03-27 NOTE — Telephone Encounter (Signed)
Pt scheduled for myoview on 04/02/14 due to abnormal GXT. I will forward this message to Dr Excell Seltzer for review.

## 2014-03-27 NOTE — Telephone Encounter (Signed)
Best to wait until after Myoview. thx

## 2014-03-27 NOTE — Telephone Encounter (Signed)
Pt's wife aware.

## 2014-04-02 ENCOUNTER — Ambulatory Visit (HOSPITAL_COMMUNITY): Payer: Self-pay | Attending: Cardiovascular Disease | Admitting: Radiology

## 2014-04-02 VITALS — BP 114/81 | Ht 71.0 in | Wt 198.0 lb

## 2014-04-02 DIAGNOSIS — I2510 Atherosclerotic heart disease of native coronary artery without angina pectoris: Secondary | ICD-10-CM | POA: Insufficient documentation

## 2014-04-02 DIAGNOSIS — R9430 Abnormal result of cardiovascular function study, unspecified: Secondary | ICD-10-CM

## 2014-04-02 DIAGNOSIS — Z9861 Coronary angioplasty status: Secondary | ICD-10-CM | POA: Insufficient documentation

## 2014-04-02 DIAGNOSIS — R5381 Other malaise: Secondary | ICD-10-CM | POA: Insufficient documentation

## 2014-04-02 DIAGNOSIS — I252 Old myocardial infarction: Secondary | ICD-10-CM | POA: Insufficient documentation

## 2014-04-02 DIAGNOSIS — R5383 Other fatigue: Principal | ICD-10-CM

## 2014-04-02 MED ORDER — TECHNETIUM TC 99M SESTAMIBI GENERIC - CARDIOLITE
11.0000 | Freq: Once | INTRAVENOUS | Status: AC | PRN
  Administered 2014-04-02: 11 via INTRAVENOUS

## 2014-04-02 MED ORDER — TECHNETIUM TC 99M SESTAMIBI GENERIC - CARDIOLITE
33.0000 | Freq: Once | INTRAVENOUS | Status: AC | PRN
  Administered 2014-04-02: 33 via INTRAVENOUS

## 2014-04-02 NOTE — Progress Notes (Signed)
[facility] SITE 3 NUCLEAR MED [address] [place], [place] [address] [phone]    Cardiology Nuclear Med Study  Curtis Rosales is a 53 y.o. male     MRN : [id number]     DOB: 1960-11-15  Procedure Date: 04/02/2014  Nuclear Med Background Indication for Stress Test:  Evaluation for Ischemia, Stent Patency, and 03-15-2014 Abnormal ETT: (+) ST changes ([facility]) History:  H/O VFIB Arrest-CAD-MI-CATH-STENT-LAD 01/03/13 ECHO: EF: 50-55%  Cardiac Risk Factors:Strong Family History - CAD and Lipids  Symptoms:  Fatigue   Nuclear Pre-Procedure Caffeine/Decaff Intake:  None> 12 hrs NPO After: 8:30am   Lungs:  clear O2 Sat: 96% on room air. IV 0.9% NS with Angio Cath:  22g  IV Site: R Antecubital x 1, tolerated well IV Started by:  Irean Hong, RN  Chest Size (in):  44 Cup Size: n/a  Height: 5\' 11"  (1.803 m)  Weight:  198 lb (89.812 kg)  BMI:  Body mass index is 27.63 kg/(m^2). Tech Comments:  Held Coreg x 24 hrs    Nuclear Med Study 1 or 2 day study: 1 day  Stress Test Type:  Stress  Reading MD: N/A  Order Authorizing Provider:  [name], MD  Resting Radionuclide: Technetium 38m Sestamibi  Resting Radionuclide Dose: 11.0 mCi   Stress Radionuclide:  Technetium 52m Sestamibi  Stress Radionuclide Dose: 33.0 mCi           Stress Protocol Rest HR: 67 Stress HR: 146  Rest BP: 114/81 Stress BP: 164/76  Exercise Time (min): 9:00 METS: 10.10   Predicted Max HR: 168 bpm % Max HR: 86.9 bpm Rate Pressure Product: 31517   Dose of Adenosine (mg):  n/a Dose of Lexiscan: n/a mg  Dose of Atropine (mg): n/a Dose of Dobutamine: n/a mcg/kg/min (at max HR)  Stress Test Technologist: Milana Na, EMT-P  Nuclear Technologist:  Domenic Polite, CNMT     Rest Procedure:  Myocardial perfusion imaging was performed at rest 45 minutes following the intravenous administration of Technetium 75m Sestamibi. Rest ECG: NSR - Normal EKG  Stress Procedure:  The patient exercised on the  treadmill utilizing the Bruce Protocol for 9:00 minutes. The patient stopped due to fatigue and denied any chest pain.  Technetium 53m Sestamibi was injected at peak exercise and myocardial perfusion imaging was performed after a brief delay. Stress ECG: Insignificant upsloping ST segment depression.  QPS Raw Data Images:  Normal; no motion artifact; normal heart/lung ratio. Stress Images:  Normal homogeneous uptake in all areas of the myocardium. Rest Images:  Normal homogeneous uptake in all areas of the myocardium. Subtraction (SDS):  No evidence of ischemia. Transient Ischemic Dilatation (Normal <1.22):  0.86 Lung/Heart Ratio (Normal <0.45):  0.36  Quantitative Gated Spect Images QGS EDV:  105 ml QGS ESV:  50 ml  Impression Exercise Capacity:  Good exercise capacity. BP Response:  Normal blood pressure response. Clinical Symptoms:  No chest pain. ECG Impression:  Insignificant upsloping ST segment depression. Comparison with Prior Nuclear Study: No images to compare  Overall Impression:  Normal stress nuclear study.  LV Ejection Fraction: 53%.  LV Wall Motion:  NL LV Function; NL Wall Motion  Cassell Clement MD

## 2014-05-02 NOTE — Telephone Encounter (Signed)
Encounter complete. 

## 2014-05-10 NOTE — Telephone Encounter (Signed)
Encounter complete. 

## 2014-08-18 ENCOUNTER — Other Ambulatory Visit: Payer: Self-pay | Admitting: Cardiovascular Disease

## 2014-08-18 NOTE — Telephone Encounter (Signed)
Rx was sent to pharmacy electronically. 

## 2014-08-20 ENCOUNTER — Other Ambulatory Visit: Payer: Self-pay

## 2014-08-20 MED ORDER — CARVEDILOL 3.125 MG PO TABS: 3.1250 mg | ORAL_TABLET | Freq: Two times a day (BID) | ORAL | Status: DC

## 2014-09-14 ENCOUNTER — Ambulatory Visit (INDEPENDENT_AMBULATORY_CARE_PROVIDER_SITE_OTHER): Payer: Self-pay | Admitting: Physician Assistant

## 2014-09-14 ENCOUNTER — Encounter: Payer: Self-pay | Admitting: Physician Assistant

## 2014-09-14 VITALS — BP 106/80 | HR 82 | Ht 71.0 in | Wt 198.8 lb

## 2014-09-14 DIAGNOSIS — I2510 Atherosclerotic heart disease of native coronary artery without angina pectoris: Secondary | ICD-10-CM

## 2014-09-14 DIAGNOSIS — E7800 Pure hypercholesterolemia, unspecified: Secondary | ICD-10-CM | POA: Insufficient documentation

## 2014-09-14 DIAGNOSIS — I255 Ischemic cardiomyopathy: Secondary | ICD-10-CM

## 2014-09-14 NOTE — Progress Notes (Signed)
Cardiology Office Note   Date:  09/14/2014   ID:  Curtis Mormonoy Stfleur, DOB November 03, 1960, MRN [id number]  PCP:  Ambrose MantleWILLET, GLENN R, MD  Cardiologist:  Dr. 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  He has a history of CAD, status post acute anterolateral STEMI in 2013 complicated by out-of-hospital V. Fib arrest. Patient was treated with PCI with DES to the LAD and balloon angioplasty to the diagonal. EF was 30% at the time of his myocardial infarction. This has improved to normal. Last seen by Dr. Excell Seltzerooper 01/2014. Routine exercise treadmill test was arranged. This demonstrated ischemic ST changes. Follow-up Myoview was normal.  He returns for FU.  He is here with his wife.  He is doing well.  The patient denies chest pain, shortness of breath, syncope, orthopnea, PND or significant pedal edema.   Studies:  - LHC (9/13):  pLAD occl, critical stenosis involving D1 bifurcation, pCFX 30%, anterolateral and apcial AK, EF 30% >> PCI:  3 x 16 mm Promus DES to pLAD and POBA to D1  - Echo (3/14):  Mild LVH. There was mild focal basal hypertrophy of the septum. EF 50% to 55%. Mild inf HK.  Gr 1 DD.  Left atrium mildly dilated.  - Nuclear (6/15):  Normal stress nuclear study.  LV Ejection Fraction: 53%. (done b/c ETT abnormal)   Recent Labs/Images:  No results found for requested labs within last 365 days.    Wt Readings from Last 3 Encounters:  09/14/14 198 lb 12.8 oz (90.175 kg)  04/02/14 198 lb (89.812 kg)  02/23/14 201 lb 1.9 oz (91.227 kg)     Past Medical History  Diagnosis Date  . CAD (coronary artery disease)     a. 07/2012 Anterolateral MI/Cath: LAD 100 100%-> 3.0x16 Promus Element DES, otw nonobs dzs, EF 30%  . Ischemic cardiomyopathy     a. 07/2012 Echo: EF 25-30% w/ mid ant, basal inf, sept Sev HK & apical, dist post, mid/dist inf, dist ant, dist lat AK. Gr 1 DD, mild MR.  . VF (ventricular fibrillation)     a. in  setting of MI 07/2012  . MI (myocardial infarction)     Current Outpatient Prescriptions  Medication Sig Dispense Refill  . aspirin EC 81 MG tablet Take 1 tablet (81 mg total) by mouth daily.    . carvedilol (COREG) 3.125 MG tablet Take 1 tablet (3.125 mg total) by mouth 2 (two) times daily. 180 tablet 3  . nitroGLYCERIN (NITROSTAT) 0.4 MG SL tablet Place 1 tablet (0.4 mg total) under the tongue every 5 (five) minutes x 3 doses as needed for chest pain. 25 tablet 3  . omeprazole (PRILOSEC OTC) 20 MG tablet Take 20 mg by mouth daily.    . pravastatin (PRAVACHOL) 40 MG tablet TAKE ONE TABLET BY MOUTH ONCE DAILY 30 tablet 6   No current facility-administered medications for this visit.     Allergies:   Review of patient's allergies indicates no known allergies.   Social History:  The patient  reports that he has never smoked. He has never used smokeless tobacco. He reports that he does not drink alcohol or use illicit drugs.   Family History:  The patient's family history is not on file.   ROS:  Please see the history of present illness.      All other systems reviewed and negative.    PHYSICAL  EXAM: VS:  BP 106/80 mmHg  Pulse 82  Ht 5\' 11"  (1.803 m)  Wt 198 lb 12.8 oz (90.175 kg)  BMI 27.74 kg/m2 Well nourished, well developed, in no acute distress HEENT: normal Neck: no  JVD Vascular:  No carotid bruits bilaterally Cardiac:  normal S1, S2;  RRR; no murmur   Lungs:   clear to auscultation bilaterally, no wheezing, rhonchi or rales Abd: soft, nontender, no hepatomegaly Ext: no edema Skin: warm and dry Neuro:  CNs 2-12 intact, no focal abnormalities noted  EKG:  NSR, HR 82, normal axis, no ST changes      ASSESSMENT AND PLAN:  1.  Coronary artery disease:  He is doing well without anginal symptoms.  Recent nuclear stress test was low risk.  Continue ASA, beta blocker, statin. 2.  Cardiomyopathy, ischemic:  EF returned to normal.  Continue beta blocker.  BP too soft to add  ACEI. 3.  Pure hypercholesterolemia:  Continue statin.  Arrange fasting Hepatic function panel, Lipid Profile.   Disposition:   FU with Dr. Tonny BollmanMichael Cooper 6 mos.    Signed, Brynda RimScott Julicia Krieger, PA-C, MHS 09/14/2014 9:30 AM    [facility] [address], [place], [place]  [address] Phone: [phone]; Fax: [phone]

## 2014-09-14 NOTE — Patient Instructions (Signed)
FASTING LIPID AND LIVER PANEL NEEDS TO BE DONE  Your physician wants you to follow-up in: 6 MONTHS WITH DR. Theodoro ParmaOOPER You will receive a reminder letter in the mail two months in advance. If you don't receive a letter, please call our office to schedule the follow-up appointment.

## 2014-09-21 ENCOUNTER — Other Ambulatory Visit (INDEPENDENT_AMBULATORY_CARE_PROVIDER_SITE_OTHER): Payer: Self-pay | Admitting: *Deleted

## 2014-09-21 DIAGNOSIS — E7800 Pure hypercholesterolemia, unspecified: Secondary | ICD-10-CM

## 2014-09-21 DIAGNOSIS — I2510 Atherosclerotic heart disease of native coronary artery without angina pectoris: Secondary | ICD-10-CM

## 2014-09-21 LAB — LIPID PANEL
Cholesterol: 160 mg/dL (ref 0–200)
HDL: 32 mg/dL — ABNORMAL LOW (ref >39.00)
NONHDL: 128
Total CHOL/HDL Ratio: 5
Triglycerides: 326 mg/dL — ABNORMAL HIGH (ref 0.0–149.0)
VLDL: 65.2 mg/dL — ABNORMAL HIGH (ref 0.0–40.0)

## 2014-09-21 LAB — HEPATIC FUNCTION PANEL
ALK PHOS: 67 U/L (ref 39–117)
ALT: 21 U/L (ref 0–53)
AST: 25 U/L (ref 0–37)
Albumin: 4.1 g/dL (ref 3.5–5.2)
BILIRUBIN DIRECT: 0 mg/dL (ref 0.0–0.3)
TOTAL PROTEIN: 6.9 g/dL (ref 6.0–8.3)
Total Bilirubin: 0.5 mg/dL (ref 0.2–1.2)

## 2014-09-21 LAB — LDL CHOLESTEROL, DIRECT: Direct LDL: 81.1 mg/dL

## 2014-09-25 ENCOUNTER — Telehealth: Payer: Self-pay | Admitting: *Deleted

## 2014-09-25 NOTE — Telephone Encounter (Signed)
lmptcb to go over lab results. I see that pt's wife cb earlier today but I did not see a DPR on file.

## 2014-09-25 NOTE — Telephone Encounter (Signed)
lvm with pt's son Christiane HaJonathan for ptcb .

## 2014-09-25 NOTE — Telephone Encounter (Signed)
Follow up ° ° ° ° °Returning a nurses call °

## 2014-09-26 NOTE — Telephone Encounter (Signed)
New Msg  Patient wife calling to get results of labs. Please contact at [phone].

## 2014-09-26 NOTE — Telephone Encounter (Signed)
Called and spoke with pt - reviewed lab results and to increase walking/exercise and to decrease pasta/pastry/breads etc.  He states understanding.

## 2014-10-11 ENCOUNTER — Encounter (HOSPITAL_COMMUNITY): Payer: Self-pay | Admitting: Cardiovascular Disease

## 2015-02-25 ENCOUNTER — Ambulatory Visit (INDEPENDENT_AMBULATORY_CARE_PROVIDER_SITE_OTHER): Payer: Self-pay | Admitting: Physician Assistant

## 2015-02-25 ENCOUNTER — Other Ambulatory Visit: Payer: Self-pay | Admitting: *Deleted

## 2015-02-25 ENCOUNTER — Encounter: Payer: Self-pay | Admitting: Physician Assistant

## 2015-02-25 VITALS — BP 116/84 | HR 77 | Ht 71.0 in | Wt 199.1 lb

## 2015-02-25 DIAGNOSIS — E7800 Pure hypercholesterolemia, unspecified: Secondary | ICD-10-CM

## 2015-02-25 DIAGNOSIS — E781 Pure hyperglyceridemia: Secondary | ICD-10-CM

## 2015-02-25 DIAGNOSIS — I2510 Atherosclerotic heart disease of native coronary artery without angina pectoris: Secondary | ICD-10-CM

## 2015-02-25 NOTE — Patient Instructions (Signed)
Medication Instructions:  Your physician recommends that you continue on your current medications as directed. Please refer to the Current Medication list given to you today.   Labwork: NONE AT THIS TIME  Testing/Procedures: NONE AT THIS TIME  Follow-Up: Your physician wants you to follow-up in:  IN 6 MONTHS WITH DR Theodoro Parma will receive a reminder letter in the mail two months in advance. If you don't receive a letter, please call our office to schedule the follow-up appointment.   Any Other Special Instructions Will Be Listed Below (If Applicable).  Food Choices to Lower Your Triglycerides  Triglycerides are a type of fat in your blood. High levels of triglycerides can increase the risk of heart disease and stroke. If your triglyceride levels are high, the foods you eat and your eating habits are very important. Choosing the right foods can help lower your triglycerides.  WHAT GENERAL GUIDELINES DO I NEED TO FOLLOW?  Lose weight if you are overweight.   Limit or avoid alcohol.   Fill one half of your plate with vegetables and green salads.   Limit fruit to two servings a day. Choose fruit instead of juice.   Make one fourth of your plate whole grains. Look for the word "whole" as the first word in the ingredient list.  Fill one fourth of your plate with lean protein foods.  Enjoy fatty fish (such as salmon, mackerel, sardines, and tuna) three times a week.   Choose healthy fats.   Limit foods high in starch and sugar.  Eat more home-cooked food and less restaurant, buffet, and fast food.  Limit fried foods.  [place] foods using methods other than frying.  Limit saturated fats.  Check ingredient lists to avoid foods with partially hydrogenated oils (trans fats) in them. WHAT FOODS CAN I EAT?  Grains Whole grains, such as whole wheat or whole grain breads, crackers, cereals, and pasta. Unsweetened oatmeal, bulgur, barley, quinoa, or brown rice. Corn or whole  wheat flour tortillas.  Vegetables Fresh or frozen vegetables (raw, steamed, roasted, or grilled). Green salads. Fruits All fresh, canned (in natural juice), or frozen fruits. Meat and Other Protein Products Ground beef (85% or leaner), grass-fed beef, or beef trimmed of fat. Skinless chicken or [place]. Ground chicken or [place]. Pork trimmed of fat. All fish and seafood. Eggs. Dried beans, peas, or lentils. Unsalted nuts or seeds. Unsalted canned or dry beans. Dairy Low-fat dairy products, such as skim or 1% milk, 2% or reduced-fat cheeses, low-fat ricotta or cottage cheese, or plain low-fat yogurt. Fats and Oils Tub margarines without trans fats. Light or reduced-fat mayonnaise and salad dressings. Avocado. Safflower, olive, or canola oils. Natural peanut or almond butter. The items listed above may not be a complete list of recommended foods or beverages. Contact your dietitian for more options. WHAT FOODS ARE NOT RECOMMENDED?  Grains White bread. White pasta. White rice. Cornbread. Bagels, pastries, and croissants. Crackers that contain trans fat. Vegetables White potatoes. Corn. Creamed or fried vegetables. Vegetables in a cheese sauce. Fruits Dried fruits. Canned fruit in light or heavy syrup. Fruit juice. Meat and Other Protein Products Fatty cuts of meat. Ribs, chicken wings, bacon, sausage, bologna, salami, chitterlings, fatback, hot dogs, bratwurst, and packaged luncheon meats. Dairy Whole or 2% milk, cream, half-and-half, and cream cheese. Whole-fat or sweetened yogurt. Full-fat cheeses. Nondairy creamers and whipped toppings. Processed cheese, cheese spreads, or cheese curds. Sweets and Desserts Corn syrup, sugars, honey, and molasses. Candy. Jam and jelly. Syrup. Sweetened  cereals. Cookies, pies, cakes, donuts, muffins, and ice cream. Fats and Oils Butter, stick margarine, lard, shortening, ghee, or bacon fat. Coconut, palm kernel, or palm oils. Beverages Alcohol. Sweetened  drinks (such as sodas, lemonade, and fruit drinks or punches). The items listed above may not be a complete list of foods and beverages to avoid. Contact your dietitian for more information. Document Released: 08/06/2004 Document Revised: 10/24/2013 Document Reviewed: 08/23/2013 [facility] Patient Information 2015 [employer] [place]. This information is not intended to replace advice given to you by your health care provider. Make sure you discuss any questions you have with your health care provider.

## 2015-02-25 NOTE — Assessment & Plan Note (Signed)
Patient had anterolateral STEMI in 2013 complicated by out of hospital V. fib arrest. He was treated with drug-eluting stent to the LAD and balloon angioplasty to the diagonal. EF was 30% at time of his MI but has recovered to normal. Stress Myoview in 04/2014 was normal without ischemia EF 53%. He is having no symptoms and doing well. Follow-up with Dr. Excell Seltzerooper in 6 months. Recommend increase exercise program.

## 2015-02-25 NOTE — Assessment & Plan Note (Signed)
Cholesterol stable in November. Continue  Pravachol

## 2015-02-25 NOTE — Assessment & Plan Note (Signed)
Patient's triglycerides were elevated in November. He does eat a lot of sweets. I had along discussion with him about diet versus medication. He would like to try cutting back on the sugars in his diet. Follow-up lipid panel and appointment with Dr. Excell Seltzerooper in 6 months.

## 2015-02-25 NOTE — Progress Notes (Signed)
Cardiology Office Note   Date:  02/25/2015   ID:  Curtis Rosales, DOB [birth year], MRN [id number]  PCP:  Ambrose Mantle, MD  Cardiologist: [name], M.D.  Chief Complaint: 6 month follow-up    History of Present Illness: Curtis Rosales is a 54 y.o. male who presents for follow-up of CAD. He has a history of CAD, status post acute anterolateral STEMI in 2013 complicated by out-of-hospital V. Fib arrest. Patient was treated with PCI with DES to the LAD and balloon angioplasty to the diagonal. EF was 30% at the time of his myocardial infarction. This has improved to normal. Last seen by Dr. Excell Seltzer 01/2014. Routine exercise treadmill test was arranged. This demonstrated ischemic ST changes. Follow-up Myoview was normal 04/2014 EF 53%.  Patient comes in today accompanied by his wife. He has been doing well. He denies any chest pain, palpitations, dyspnea, dyspnea on exertion, dizziness or presyncope. 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 He is not exercising as regularly as he used to. When he walks he does about a mile. Labs reviewed from November reveal a triglyceride of 326. He admits to eating a lot of sweets including cookies and ice cream. He does not smoke.      Past Medical History  Diagnosis Date  . CAD (coronary artery disease)     a. 07/2012 Anterolateral MI/Cath: LAD 100 100%-> 3.0x16 Promus Element DES, otw nonobs dzs, EF 30%  . Ischemic cardiomyopathy     a. 07/2012 Echo: EF 25-30% w/ mid ant, basal inf, sept Sev HK & apical, dist post, mid/dist inf, dist ant, dist lat AK. Gr 1 DD, mild MR.  . VF (ventricular fibrillation)     a. in setting of MI 07/2012  . MI (myocardial infarction)     Past Surgical History  Procedure Laterality Date  . Appendectomy    . Cardiac catheterization    . Left heart catheterization with coronary angiogram N/A 07/08/2012    Procedure: LEFT HEART CATHETERIZATION WITH CORONARY ANGIOGRAM;  Surgeon: [name], MD;  [facility];  Service:  Cardiovascular;  Laterality: N/A;  . Percutaneous coronary stent intervention (pci-s)  07/08/2012    Procedure: PERCUTANEOUS CORONARY STENT INTERVENTION (PCI-S);  Surgeon: [name], MD;  Location: [facility] CATH LAB;  Service: Cardiovascular;;     Current Outpatient Prescriptions  Medication Sig Dispense Refill  . aspirin EC 81 MG tablet Take 1 tablet (81 mg total) by mouth daily.    . carvedilol (COREG) 3.125 MG tablet Take 1 tablet (3.125 mg total) by mouth 2 (two) times daily. 180 tablet 3  . nitroGLYCERIN (NITROSTAT) 0.4 MG SL tablet Place 1 tablet (0.4 mg total) under the tongue every 5 (five) minutes x 3 doses as needed for chest pain. 25 tablet 3  . omeprazole (PRILOSEC OTC) 20 MG tablet Take 20 mg by mouth daily.    . pravastatin (PRAVACHOL) 40 MG tablet TAKE ONE TABLET BY MOUTH ONCE DAILY 30 tablet 6   No current facility-administered medications for this visit.    Allergies:   Review of patient's allergies indicates no known allergies.    Social History:  The patient  reports that he has never smoked. He has never used smokeless tobacco. He reports that he does not drink alcohol or use illicit drugs.   Family History:  The patient's family history includes Heart attack (age of onset: 41) in his other; Heart attack (age of onset: 49) in his sister; Heart attack (age of  onset: 3555) in his father.    ROS:  Please see the history of present illness.   Otherwise, review of systems are positive for none.   All other systems are reviewed and negative.    PHYSICAL EXAM: VS:  BP 116/84 mmHg  Pulse 77  Ht 5\' 11"  (1.803 m)  Wt 199 lb 1.9 oz (90.32 kg)  BMI 27.78 kg/m2 , BMI Body mass index is 27.78 kg/(m^2). GEN: Well nourished, well developed, in no acute distress Neck: no JVD, HJR, carotid bruits, or masses Cardiac: RRR; 2/6 systolic murmur at the apex, no gallop, rubs, thrill or heave,  Respiratory:  clear to auscultation bilaterally, normal work of breathing GI: soft, nontender,  nondistended, + BS MS: no deformity or atrophy Extremities: without cyanosis, clubbing, edema, good distal pulses bilaterally.  Skin: warm and dry, no rash Neuro:  Strength and sensation are intact    EKG:  EKG is not ordered today.    Recent Labs: 09/21/2014: ALT 21    Lipid Panel    Component Value Date/Time   CHOL 160 09/21/2014 0834   TRIG 326.0* 09/21/2014 0834   HDL 32.00* 09/21/2014 0834   CHOLHDL 5 09/21/2014 0834   VLDL 65.2* 09/21/2014 0834   LDLCALC 78 07/17/2013 0808   LDLDIRECT 81.1 09/21/2014 0834      Wt Readings from Last 3 Encounters:  02/25/15 199 lb 1.9 oz (90.32 kg)  09/14/14 198 lb 12.8 oz (90.175 kg)  04/02/14 198 lb (89.812 kg)      Other studies Reviewed: Additional studies/ records that were reviewed today include and review of the records demonstrates:  Studies:  - LHC (9/13):  pLAD occl, critical stenosis involving D1 bifurcation, pCFX 30%, anterolateral and apcial AK, EF 30% >> PCI:  3 x 16 mm Promus DES to pLAD and POBA to D1  - Echo (3/14):  Mild LVH. There was mild focal basal hypertrophy of the septum. EF 50% to 55%. Mild inf HK.  Gr 1 DD.  Left atrium mildly dilated.  - Nuclear (6/15):  Normal stress nuclear study.  LV Ejection Fraction: 53%. (done b/c ETT abnormal)     ASSESSMENT AND PLAN: CAD (coronary artery disease) Patient had anterolateral STEMI in 2013 complicated by out of hospital V. fib arrest. He was treated with drug-eluting stent to the LAD and balloon angioplasty to the diagonal. EF was 30% at time of his MI but has recovered to normal. Stress Myoview in 04/2014 was normal without ischemia EF 53%. He is having no symptoms and doing well. Follow-up with Dr. Excell Seltzerooper in 6 months. Recommend increase exercise program.   Hypertriglyceridemia Patient's triglycerides were elevated in November. He does eat a lot of sweets. I had along discussion with him about diet versus medication. He would like to try cutting back on the  sugars in his diet. Follow-up lipid panel and appointment with Dr. Excell Seltzerooper in 6 months.   Pure hypercholesterolemia Cholesterol stable in November. Continue  Pravachol      Signed, Jacolyn ReedyMichele Lenze, PA-C  02/25/2015 9:05 AM    [facility] [address], [place], [place]  [address] Phone: [phone]; Fax: [phone]) [phone]

## 2015-03-22 ENCOUNTER — Other Ambulatory Visit: Payer: Self-pay | Admitting: Cardiovascular Disease

## 2015-03-22 DIAGNOSIS — I255 Ischemic cardiomyopathy: Secondary | ICD-10-CM

## 2015-03-22 MED ORDER — NITROGLYCERIN 0.4 MG SL SUBL: 0.4000 mg | SUBLINGUAL_TABLET | SUBLINGUAL | Status: DC | PRN

## 2015-08-19 ENCOUNTER — Other Ambulatory Visit: Payer: Self-pay | Admitting: Cardiovascular Disease

## 2015-08-20 ENCOUNTER — Telehealth: Payer: Self-pay | Admitting: Cardiovascular Disease

## 2015-08-20 NOTE — Telephone Encounter (Signed)
°  STAT if patient is at the pharmacy , call can be transferred to refill team.   1. Which medications need to be refilled? Coreg  2. Which pharmacy/location is medication to be sent to? [facility] in [place]  3. Do they need a 30 day or 90 day supply? 90 day

## 2015-08-20 NOTE — Telephone Encounter (Signed)
Was sent in yesterday 

## 2015-11-15 ENCOUNTER — Ambulatory Visit (INDEPENDENT_AMBULATORY_CARE_PROVIDER_SITE_OTHER): Payer: Self-pay | Admitting: Cardiovascular Disease

## 2015-11-15 ENCOUNTER — Encounter: Payer: Self-pay | Admitting: Cardiovascular Disease

## 2015-11-15 VITALS — BP 116/70 | HR 70 | Ht 71.0 in | Wt 206.4 lb

## 2015-11-15 DIAGNOSIS — E781 Pure hyperglyceridemia: Secondary | ICD-10-CM

## 2015-11-15 DIAGNOSIS — E7800 Pure hypercholesterolemia, unspecified: Secondary | ICD-10-CM

## 2015-11-15 DIAGNOSIS — I2510 Atherosclerotic heart disease of native coronary artery without angina pectoris: Secondary | ICD-10-CM

## 2015-11-15 DIAGNOSIS — I255 Ischemic cardiomyopathy: Secondary | ICD-10-CM

## 2015-11-15 LAB — COMPREHENSIVE METABOLIC PANEL
ALBUMIN: 4.1 g/dL (ref 3.6–5.1)
ALK PHOS: 61 U/L (ref 40–115)
ALT: 24 U/L (ref 9–46)
AST: 25 U/L (ref 10–35)
BUN: 20 mg/dL (ref 7–25)
CALCIUM: 9.6 mg/dL (ref 8.6–10.3)
CHLORIDE: 105 mmol/L (ref 98–110)
CO2: 24 mmol/L (ref 20–31)
Creat: 0.94 mg/dL (ref 0.70–1.33)
Glucose, Bld: 103 mg/dL — ABNORMAL HIGH (ref 65–99)
POTASSIUM: 4.8 mmol/L (ref 3.5–5.3)
Sodium: 140 mmol/L (ref 135–146)
TOTAL PROTEIN: 6.7 g/dL (ref 6.1–8.1)
Total Bilirubin: 0.5 mg/dL (ref 0.2–1.2)

## 2015-11-15 LAB — LIPID PANEL
CHOLESTEROL: 141 mg/dL (ref 125–200)
HDL: 29 mg/dL — AB (ref >=40)
LDL Cholesterol: 68 mg/dL (ref <130)
TRIGLYCERIDES: 221 mg/dL — AB (ref <150)
Total CHOL/HDL Ratio: 4.9 Ratio (ref <=5.0)
VLDL: 44 mg/dL — ABNORMAL HIGH (ref <30)

## 2015-11-15 MED ORDER — PRAVASTATIN SODIUM 40 MG PO TABS: 40.0000 mg | ORAL_TABLET | Freq: Every day | ORAL | Status: DC

## 2015-11-15 MED ORDER — CARVEDILOL 3.125 MG PO TABS: 3.1250 mg | ORAL_TABLET | Freq: Two times a day (BID) | ORAL | Status: DC

## 2015-11-15 NOTE — Progress Notes (Signed)
Cardiology Office Note Date:  11/15/2015   ID:  Curtis Rosales, DOB 1961-06-28, MRN [id number]  PCP:  [name], MD  Cardiologist:  Sherren Mocha, MD    Chief Complaint  Patient presents with  . Follow-up    CAD     History of Present Illness: Curtis Rosales is a 55 y.o. male who presents for follow-up evaluation. The patient has been followed for coronary artery disease. He initially presented in 2013 with an anterolateral STEMI complicated by out of hospital ventricular fibrillation arrest. He underwent primary PCI the LAD and balloon angioplasty of the diagonal. His LVEF normalized on follow-up. His last nuclear scan in June 2015 showed normal perfusion. The patient is here today with his wife. He has no cardiac related complaints. He specifically denies chest pain, shortness of breath, heart palpitations, or syncope. He continues to race cars.   Past Medical History  Diagnosis Date  . CAD (coronary artery disease)     a. 07/2012 Anterolateral MI/Cath: LAD 100 100%-> 3.0x16 Promus Element DES, otw nonobs dzs, EF 30%  . Ischemic cardiomyopathy     a. 07/2012 Echo: EF 25-30% w/ mid ant, basal inf, sept Sev HK & apical, dist post, mid/dist inf, dist ant, dist lat AK. Gr 1 DD, mild MR.  . VF (ventricular fibrillation) ([place])     a. in setting of MI 07/2012  . MI (myocardial infarction) [place])     Past Surgical History  Procedure Laterality Date  . Appendectomy    . Cardiac catheterization    . Left heart catheterization with coronary angiogram N/A 07/08/2012    Procedure: LEFT HEART CATHETERIZATION WITH CORONARY ANGIOGRAM;  Surgeon: Sherren Mocha, MD;  Location: [facility] CATH LAB;  Service: Cardiovascular;  Laterality: N/A;  . Percutaneous coronary stent intervention (pci-s)  07/08/2012    Procedure: PERCUTANEOUS CORONARY STENT INTERVENTION (PCI-S);  Surgeon: Sherren Mocha, MD;  [facility] LAB;  Service: Cardiovascular;;    Current Outpatient Prescriptions  Medication Sig Dispense  Refill  . aspirin EC 81 MG tablet Take 1 tablet (81 mg total) by mouth daily.    . carvedilol (COREG) 3.125 MG tablet Take 1 tablet (3.125 mg total) by mouth 2 (two) times daily. 180 tablet 3  . nitroGLYCERIN (NITROSTAT) 0.4 MG SL tablet Place 1 tablet (0.4 mg total) under the tongue every 5 (five) minutes x 3 doses as needed for chest pain. 25 tablet 3  . omeprazole (PRILOSEC OTC) 20 MG tablet Take 20 mg by mouth daily.    . pravastatin (PRAVACHOL) 40 MG tablet Take 1 tablet (40 mg total) by mouth daily. 90 tablet 3   No current facility-administered medications for this visit.    Allergies:   Review of patient's allergies indicates no known allergies.   Social History:  The patient  reports that he has never smoked. He has never used smokeless tobacco. He reports that he does not drink alcohol or use illicit drugs.   Family History:  The patient's family history includes Heart attack (age of onset: 74) in his other; Heart attack (age of onset: 50) in his sister; Heart attack (age of onset: 58) in his father.    ROS:  Please see the history of present illness.  All other systems are reviewed and negative.    PHYSICAL EXAM: VS:  BP 116/70 mmHg  Pulse 70  Ht _0  (1.803 m)  Wt 206 lb 6.4 oz (93.622 kg)  BMI 28.80 kg/m2 , BMI Body mass index is  28.8 kg/(m^2). GEN: Well nourished, well developed, in no acute distress HEENT: normal Neck: no JVD, no masses. No carotid bruits Cardiac: RRR without murmur or gallop                Respiratory:  clear to auscultation bilaterally, normal work of breathing GI: soft, nontender, nondistended, + BS MS: no deformity or atrophy Ext: no pretibial edema, pedal pulses 2+= bilaterally Skin: warm and dry, no rash Neuro:  Strength and sensation are intact Psych: euthymic mood, full affect  EKG:  EKG is ordered today. The ekg ordered today shows normal sinus rhythm 71 bpm, within normal limits.  Recent Labs: No results found for requested labs  within last 365 days.   Lipid Panel     Component Value Date/Time   CHOL 160 09/21/2014 0834   TRIG 326.0* 09/21/2014 0834   HDL 32.00* 09/21/2014 0834   CHOLHDL 5 09/21/2014 0834   VLDL 65.2* 09/21/2014 0834   LDLCALC 78 07/17/2013 0808   LDLDIRECT 81.1 09/21/2014 0834      Wt Readings from Last 3 Encounters:  11/15/15 206 lb 6.4 oz (93.622 kg)  02/25/15 199 lb 1.9 oz (90.32 kg)  09/14/14 198 lb 12.8 oz (90.175 kg)     Cardiac Studies Reviewed: Myoview Scan 04/02/2014: Impression Exercise Capacity: Good exercise capacity. BP Response: Normal blood pressure response. Clinical Symptoms: No chest pain. ECG Impression: Insignificant upsloping ST segment depression. Comparison with Prior Nuclear Study: No images to compare  Overall Impression: Normal stress nuclear study.  LV Ejection Fraction: 53%. LV Wall Motion: NL LV Function; NL Wall Motion  ASSESSMENT AND PLAN: 1.  CAD, native vessel, with old MI: no angina. Continue current medical therapy - reviewed today.   2. Hyperlipidemia: continue pravastatin. Last lipid panel showed hypertriglyceridemia. Lifestyle modification reviewed in detail - dietary recommendations given.   Current medicines are reviewed with the patient today.  The patient does not have concerns regarding medicines.  Labs/ tests ordered today include:   Orders Placed This Encounter  Procedures  . Lipid panel  . Comp Met (CMET)  . EKG 12-Lead    Disposition:   FU one year  Signed, Sherren Mocha, MD  11/15/2015 10:13 AM    [facility] [place], [place], [place]  [address] Phone: [phone]; Fax: [phone]

## 2015-11-15 NOTE — Patient Instructions (Signed)
Medication Instructions:  No changes today  Labwork: CMET/Lipid profile today.  Testing/Procedures: None today  Follow-Up: Your physician wants you to follow-up in: 1 year with Dr Excell Seltzerooper. (January 2018). You will receive a reminder letter in the mail two months in advance. If you don't receive a letter, please call our office to schedule the follow-up appointment.        If you need a refill on your cardiac medications before your next appointment, please call your pharmacy.

## 2016-11-19 ENCOUNTER — Other Ambulatory Visit: Payer: Self-pay | Admitting: Cardiovascular Disease

## 2016-12-21 ENCOUNTER — Other Ambulatory Visit: Payer: Self-pay | Admitting: Cardiovascular Disease

## 2016-12-21 MED ORDER — CARVEDILOL 3.125 MG PO TABS: ORAL_TABLET | ORAL | 0 refills | Status: DC

## 2016-12-21 NOTE — Addendum Note (Signed)
Addended by: Drue DunLOWE, Dayvion Sans S on: 12/21/2016 11:14 AM   Modules accepted: Orders

## 2016-12-23 ENCOUNTER — Telehealth: Payer: Self-pay | Admitting: Cardiovascular Disease

## 2016-12-23 MED ORDER — CARVEDILOL 3.125 MG PO TABS: ORAL_TABLET | ORAL | 0 refills | Status: DC

## 2016-12-23 MED ORDER — PRAVASTATIN SODIUM 40 MG PO TABS: 40.0000 mg | ORAL_TABLET | Freq: Every day | ORAL | 0 refills | Status: DC

## 2016-12-23 NOTE — Telephone Encounter (Signed)
New Message    *STAT* If patient is at the pharmacy, call can be transferred to refill team.   1. Which medications need to be refilled? (please list name of each medication and dose if known)   carvedilol (COREG) 3.125 MG tablet   pravastatin (PRAVACHOL) 40 MG tablet  2. Which pharmacy/location (including street and city if local pharmacy) is medication to be sent to? [id number] - [place], [place] - [address] [address]  3. Do they need a 30 day or 90 day supply? 90 day  Appt scheduled with Dr. Excell Seltzerooper for March 23

## 2017-01-22 ENCOUNTER — Encounter: Payer: Self-pay | Admitting: Cardiovascular Disease

## 2017-01-22 ENCOUNTER — Ambulatory Visit (INDEPENDENT_AMBULATORY_CARE_PROVIDER_SITE_OTHER): Payer: Self-pay | Admitting: Cardiovascular Disease

## 2017-01-22 VITALS — BP 132/86 | HR 82 | Ht 68.0 in | Wt 210.2 lb

## 2017-01-22 DIAGNOSIS — R079 Chest pain, unspecified: Secondary | ICD-10-CM

## 2017-01-22 DIAGNOSIS — E7800 Pure hypercholesterolemia, unspecified: Secondary | ICD-10-CM

## 2017-01-22 MED ORDER — NITROGLYCERIN 0.4 MG SL SUBL: 0.4000 mg | SUBLINGUAL_TABLET | SUBLINGUAL | 6 refills | Status: DC | PRN

## 2017-01-22 NOTE — Patient Instructions (Signed)
Your physician recommends that you continue on your current medications as directed. Please refer to the Current Medication list given to you today.  Your physician recommends that you return for lab work on the day you come in for your stress test.  Your physician has requested that you have en exercise stress myoview. For further information please visit [email or web address] Please follow instruction sheet, as given.  Your physician wants you to follow-up in: 1 year with Dr. Excell Seltzerooper.  You will receive a reminder letter in the mail two months in advance. If you don't receive a letter, please call our office to schedule the follow-up appointment.

## 2017-01-22 NOTE — Progress Notes (Signed)
Cardiology Office Note Date:  01/22/2017   ID:  Curtis Rosales, DOB 05-Jun-1961, MRN [id number]  PCP:  Ambrose MantleWILLET, GLENN R, MD  Cardiologist:  Tonny Bollmanooper, Juanjesus Pepperman, MD    Chief Complaint  Patient presents with  . Coronary Artery Disease    follow up     History of Present Illness: Curtis Rosales is a 56 y.o. male who presents for follow-up evaluation. The patient has been followed for coronary artery disease. He initially presented in 2013 with an anterolateral STEMI complicated by out of hospital ventricular fibrillation arrest. He underwent primary PCI the LAD and balloon angioplasty of the diagonal. His LVEF normalized on follow-up.   He is here with his wife today. The patient complains of occasional left-sided chest pain, described as an ache. No clear association with physical exertion. Sometimes pain occurs when he is bending forward. No radiation of pain. Short of breath only when he over exerts himself. No orthopnea, PND, palpitations. No other complaints today.  Past Medical History:  Diagnosis Date  . CAD (coronary artery disease)    a. 07/2012 Anterolateral MI/Cath: LAD 100 100%-> 3.0x16 Promus Element DES, otw nonobs dzs, EF 30%  . Ischemic cardiomyopathy    a. 07/2012 Echo: EF 25-30% w/ mid ant, basal inf, sept Sev HK & apical, dist post, mid/dist inf, dist ant, dist lat AK. Gr 1 DD, mild MR.  . MI (myocardial infarction)   . VF (ventricular fibrillation) (HCC)    a. in setting of MI 07/2012    Past Surgical History:  Procedure Laterality Date  . APPENDECTOMY    . CARDIAC CATHETERIZATION    . LEFT HEART CATHETERIZATION WITH CORONARY ANGIOGRAM N/A 07/08/2012   Procedure: LEFT HEART CATHETERIZATION WITH CORONARY ANGIOGRAM;  Surgeon: Tonny BollmanMichael Lateia Fraser, MD;  [facility];  Service: Cardiovascular;  Laterality: N/A;  . PERCUTANEOUS CORONARY STENT INTERVENTION (PCI-S)  07/08/2012   Procedure: PERCUTANEOUS CORONARY STENT INTERVENTION (PCI-S);  Surgeon: Tonny BollmanMichael Jenah Vanasten, MD;  [facility];  Service: Cardiovascular;;    Current Outpatient Prescriptions  Medication Sig Dispense Refill  . aspirin EC 81 MG tablet Take 1 tablet (81 mg total) by mouth daily.    . carvedilol (COREG) 3.125 MG tablet TAKE ONE TABLET BY MOUTH TWICE DAILY 60 tablet 0  . nitroGLYCERIN (NITROSTAT) 0.4 MG SL tablet Place 1 tablet (0.4 mg total) under the tongue every 5 (five) minutes as needed for chest pain. 25 tablet 6  . omeprazole (PRILOSEC OTC) 20 MG tablet Take 20 mg by mouth daily.    . pravastatin (PRAVACHOL) 40 MG tablet Take 1 tablet (40 mg total) by mouth daily. 30 tablet 0   No current facility-administered medications for this visit.     Allergies:   Patient has no known allergies.   Social History:  The patient  reports that he has never smoked. He has never used smokeless tobacco. He reports that he does not drink alcohol or use drugs.   Family History:  The patient's  family history includes Heart attack (age of onset: 6540) in his other; Heart attack (age of onset: 3345) in his sister; Heart attack (age of onset: 3455) in his father.    ROS:  Please see the history of present illness.  All other systems are reviewed and negative.    PHYSICAL EXAM: VS:  BP 132/86   Pulse 82   Ht 5\' 8"  (1.727 m)   Wt 210 lb 3.2 oz (95.3 kg)   BMI 31.96 kg/m  ,  BMI Body mass index is 31.96 kg/m. GEN: Well nourished, well developed, in no acute distress  HEENT: normal  Neck: no JVD, no masses. No carotid bruits Cardiac: RRR without murmur or gallop                Respiratory:  clear to auscultation bilaterally, normal work of breathing GI: soft, nontender, nondistended, + BS MS: no deformity or atrophy  Ext: no pretibial edema, pedal pulses 2+= bilaterally Skin: warm and dry, no rash Neuro:  Strength and sensation are intact Psych: euthymic mood, full affect  EKG:  EKG is ordered today. The ekg ordered today shows NSR 82 bpm, within normal limits  Recent Labs: No results found for  requested labs within last 8760 hours.   Lipid Panel     Component Value Date/Time   CHOL 141 11/15/2015 0933   TRIG 221 (H) 11/15/2015 0933   HDL 29 (L) 11/15/2015 0933   CHOLHDL 4.9 11/15/2015 0933   VLDL 44 (H) 11/15/2015 0933   LDLCALC 68 11/15/2015 0933   LDLDIRECT 81.1 09/21/2014 0834      Wt Readings from Last 3 Encounters:  01/22/17 210 lb 3.2 oz (95.3 kg)  11/15/15 206 lb 6.4 oz (93.6 kg)  02/25/15 199 lb 1.9 oz (90.3 kg)     ASSESSMENT AND PLAN: 1.  CAD, native vessel, with angina: atypical symptoms. However, patient had no prodrome of symptoms before his out-of-hospital arrest and he is clearly at risk for silent ischemia. I have recommended an exercise Myoview stress test. He will continue current medical therapy.  2. Hyperlipidemia: last lipids reviewed from January 2017. Continue statin drug. Repeat labs when he returns for his stress test.   Pt counseled regarding lifestyle and diet modification. As long as his stress test is ok, will plan to see him back in one year.   Current medicines are reviewed with the patient today.  The patient does not have concerns regarding medicines.  Labs/ tests ordered today include:   Orders Placed This Encounter  Procedures  . Comprehensive metabolic panel  . Lipid panel  . Myocardial Perfusion Imaging  . EKG 12-Lead    Disposition:   FU one year  Signed, [name], MD  01/22/2017 5:37 PM    [facility] [address] [place], [place], [place]  [address] Phone: [phone]; Fax: [phone]

## 2017-01-25 ENCOUNTER — Telehealth (HOSPITAL_COMMUNITY): Payer: Self-pay | Admitting: *Deleted

## 2017-01-25 NOTE — Telephone Encounter (Signed)
Patient given detailed instructions per Myocardial Perfusion Study Information Sheet for the test on  01/28/17. Patient notified to arrive 15 minutes early and that it is imperative to arrive on time for appointment to keep from having the test rescheduled.  If you need to cancel or reschedule your appointment, please call the office within 24 hours of your appointment. Failure to do so may result in a cancellation of your appointment, and a $50 no show fee. Patient verbalized understanding. Curtis Rosales    

## 2017-01-28 ENCOUNTER — Other Ambulatory Visit: Payer: Self-pay | Admitting: *Deleted

## 2017-01-28 ENCOUNTER — Ambulatory Visit (HOSPITAL_COMMUNITY): Payer: Self-pay | Attending: Cardiovascular Disease

## 2017-01-28 DIAGNOSIS — R079 Chest pain, unspecified: Secondary | ICD-10-CM | POA: Insufficient documentation

## 2017-01-28 DIAGNOSIS — R0609 Other forms of dyspnea: Secondary | ICD-10-CM | POA: Insufficient documentation

## 2017-01-28 DIAGNOSIS — E7800 Pure hypercholesterolemia, unspecified: Secondary | ICD-10-CM

## 2017-01-28 DIAGNOSIS — I2510 Atherosclerotic heart disease of native coronary artery without angina pectoris: Secondary | ICD-10-CM | POA: Insufficient documentation

## 2017-01-28 LAB — MYOCARDIAL PERFUSION IMAGING
CHL CUP MPHR: 165 {beats}/min
CHL CUP NUCLEAR SRS: 7
CHL CUP NUCLEAR SSS: 9
CHL CUP RESTING HR STRESS: 74 {beats}/min
CHL RATE OF PERCEIVED EXERTION: 18
CSEPED: 8 min
CSEPEDS: 30 s
CSEPEW: 10.1 METS
CSEPHR: 93 %
LV dias vol: 101 mL (ref 62–150)
LV sys vol: 46 mL
Peak HR: 153 {beats}/min
RATE: 0.39
SDS: 2
TID: 0.9

## 2017-01-28 MED ORDER — TECHNETIUM TC 99M TETROFOSMIN IV KIT
10.8000 | PACK | Freq: Once | INTRAVENOUS | Status: AC | PRN
  Administered 2017-01-28: 10.8 via INTRAVENOUS
  Filled 2017-01-28: qty 11

## 2017-01-28 MED ORDER — TECHNETIUM TC 99M TETROFOSMIN IV KIT
32.2000 | PACK | Freq: Once | INTRAVENOUS | Status: AC | PRN
  Administered 2017-01-28: 32.2 via INTRAVENOUS
  Filled 2017-01-28: qty 33

## 2017-01-29 LAB — COMPREHENSIVE METABOLIC PANEL
A/G RATIO: 1.8 (ref 1.2–2.2)
ALT: 23 IU/L (ref 0–44)
AST: 21 IU/L (ref 0–40)
Albumin: 4.4 g/dL (ref 3.5–5.5)
Alkaline Phosphatase: 75 IU/L (ref 39–117)
BUN/Creatinine Ratio: 15 (ref 9–20)
BUN: 13 mg/dL (ref 6–24)
Bilirubin Total: 0.4 mg/dL (ref 0.0–1.2)
CALCIUM: 9.6 mg/dL (ref 8.7–10.2)
CO2: 22 mmol/L (ref 18–29)
CREATININE: 0.86 mg/dL (ref 0.76–1.27)
Chloride: 103 mmol/L (ref 96–106)
GFR, EST AFRICAN AMERICAN: 113 mL/min/{1.73_m2} (ref >59)
GFR, EST NON AFRICAN AMERICAN: 98 mL/min/{1.73_m2} (ref >59)
GLOBULIN, TOTAL: 2.4 g/dL (ref 1.5–4.5)
Glucose: 109 mg/dL — ABNORMAL HIGH (ref 65–99)
POTASSIUM: 4.8 mmol/L (ref 3.5–5.2)
Sodium: 143 mmol/L (ref 134–144)
TOTAL PROTEIN: 6.8 g/dL (ref 6.0–8.5)

## 2017-01-29 LAB — LIPID PANEL
Chol/HDL Ratio: 5.6 ratio units — ABNORMAL HIGH (ref 0.0–5.0)
Cholesterol, Total: 151 mg/dL (ref 100–199)
HDL: 27 mg/dL — ABNORMAL LOW (ref >39)
LDL Calculated: 61 mg/dL (ref 0–99)
Triglycerides: 313 mg/dL — ABNORMAL HIGH (ref 0–149)
VLDL CHOLESTEROL CAL: 63 mg/dL — AB (ref 5–40)

## 2017-02-05 ENCOUNTER — Encounter: Payer: Self-pay | Admitting: Cardiovascular Disease

## 2017-02-05 NOTE — Telephone Encounter (Signed)
This encounter was created in error - please disregard.

## 2017-02-05 NOTE — Telephone Encounter (Signed)
New message  ° ° °Pt daughter is returning your call  °

## 2017-02-17 ENCOUNTER — Other Ambulatory Visit: Payer: Self-pay | Admitting: Cardiovascular Disease

## 2017-03-22 ENCOUNTER — Other Ambulatory Visit: Payer: Self-pay | Admitting: Cardiovascular Disease

## 2017-12-17 ENCOUNTER — Other Ambulatory Visit: Payer: Self-pay | Admitting: Cardiovascular Disease

## 2018-01-20 ENCOUNTER — Telehealth: Payer: Self-pay | Admitting: Cardiovascular Disease

## 2018-01-20 DIAGNOSIS — R079 Chest pain, unspecified: Secondary | ICD-10-CM

## 2018-01-20 DIAGNOSIS — E7800 Pure hypercholesterolemia, unspecified: Secondary | ICD-10-CM

## 2018-01-20 MED ORDER — PRAVASTATIN SODIUM 40 MG PO TABS: 40.0000 mg | ORAL_TABLET | Freq: Every day | ORAL | 0 refills | Status: DC

## 2018-01-20 MED ORDER — CARVEDILOL 3.125 MG PO TABS: 3.1250 mg | ORAL_TABLET | Freq: Two times a day (BID) | ORAL | 0 refills | Status: DC

## 2018-01-20 MED ORDER — NITROGLYCERIN 0.4 MG SL SUBL: 0.4000 mg | SUBLINGUAL_TABLET | SUBLINGUAL | 0 refills | Status: DC | PRN

## 2018-01-20 NOTE — Telephone Encounter (Signed)
New Message   *STAT* If patient is at the pharmacy, call can be transferred to refill team.   1. Which medications need to be refilled? (please list name of each medication and dose if known) carvedilol (COREG) 3.125 MG tablet, nitroGLYCERIN (NITROSTAT) 0.4 MG SL tablet, pravastatin (PRAVACHOL) 40 MG tablet    2. Which pharmacy/location (including street and city if local pharmacy) is medication to be sent to? [facility] - [place], [place] - [address]  3. Do they need a 30 day or 90 day supply? 90

## 2018-01-20 NOTE — Telephone Encounter (Signed)
Pt's medications were sent to pt's pharmacy as requested. Confirmation received.  

## 2018-01-24 ENCOUNTER — Encounter: Payer: Self-pay | Admitting: Cardiovascular Disease

## 2018-01-24 ENCOUNTER — Other Ambulatory Visit: Payer: Self-pay

## 2018-01-24 ENCOUNTER — Emergency Department (HOSPITAL_COMMUNITY)
Admission: EM | Admit: 2018-01-24 | Discharge: 2018-01-24 | Disposition: A | Discharge status: Home / Self Care | Payer: Self-pay | Attending: Emergency Medicine | Admitting: Emergency Medicine

## 2018-01-24 ENCOUNTER — Encounter (HOSPITAL_COMMUNITY): Payer: Self-pay | Admitting: Emergency Medicine

## 2018-01-24 ENCOUNTER — Ambulatory Visit (INDEPENDENT_AMBULATORY_CARE_PROVIDER_SITE_OTHER): Payer: Self-pay | Admitting: Cardiovascular Disease

## 2018-01-24 VITALS — BP 98/52 | HR 89 | Ht 68.0 in | Wt 205.0 lb

## 2018-01-24 DIAGNOSIS — S0083XD Contusion of other part of head, subsequent encounter: Secondary | ICD-10-CM | POA: Insufficient documentation

## 2018-01-24 DIAGNOSIS — R197 Diarrhea, unspecified: Secondary | ICD-10-CM | POA: Insufficient documentation

## 2018-01-24 DIAGNOSIS — Z79899 Other long term (current) drug therapy: Secondary | ICD-10-CM | POA: Insufficient documentation

## 2018-01-24 DIAGNOSIS — I2510 Atherosclerotic heart disease of native coronary artery without angina pectoris: Secondary | ICD-10-CM | POA: Insufficient documentation

## 2018-01-24 DIAGNOSIS — D72829 Elevated white blood cell count, unspecified: Secondary | ICD-10-CM | POA: Insufficient documentation

## 2018-01-24 DIAGNOSIS — R55 Syncope and collapse: Secondary | ICD-10-CM | POA: Insufficient documentation

## 2018-01-24 DIAGNOSIS — Z7982 Long term (current) use of aspirin: Secondary | ICD-10-CM | POA: Insufficient documentation

## 2018-01-24 DIAGNOSIS — W19XXXD Unspecified fall, subsequent encounter: Secondary | ICD-10-CM | POA: Insufficient documentation

## 2018-01-24 DIAGNOSIS — R112 Nausea with vomiting, unspecified: Secondary | ICD-10-CM | POA: Insufficient documentation

## 2018-01-24 HISTORY — DX: Cardiac arrest, cause unspecified: I46.9

## 2018-01-24 LAB — CBC WITH DIFFERENTIAL/PLATELET
BASOS PCT: 0 %
Basophils Absolute: 0 10*3/uL (ref 0.0–0.1)
EOS ABS: 0.1 10*3/uL (ref 0.0–0.7)
EOS PCT: 0 %
HCT: 46.7 % (ref 39.0–52.0)
Hemoglobin: 15.6 g/dL (ref 13.0–17.0)
Lymphocytes Relative: 5 %
Lymphs Abs: 0.7 10*3/uL (ref 0.7–4.0)
MCH: 27.1 pg (ref 26.0–34.0)
MCHC: 33.4 g/dL (ref 30.0–36.0)
MCV: 81.2 fL (ref 78.0–100.0)
MONO ABS: 0.5 10*3/uL (ref 0.1–1.0)
MONOS PCT: 4 %
NEUTROS ABS: 11.7 10*3/uL — AB (ref 1.7–7.7)
Neutrophils Relative %: 91 %
PLATELETS: 243 10*3/uL (ref 150–400)
RBC: 5.75 MIL/uL (ref 4.22–5.81)
RDW: 14.2 % (ref 11.5–15.5)
WBC: 12.9 10*3/uL — ABNORMAL HIGH (ref 4.0–10.5)

## 2018-01-24 LAB — COMPREHENSIVE METABOLIC PANEL
ALK PHOS: 73 U/L (ref 38–126)
ALT: 23 U/L (ref 17–63)
ANION GAP: 11 (ref 5–15)
AST: 23 U/L (ref 15–41)
Albumin: 4.1 g/dL (ref 3.5–5.0)
BILIRUBIN TOTAL: 1.1 mg/dL (ref 0.3–1.2)
BUN: 14 mg/dL (ref 6–20)
CALCIUM: 9 mg/dL (ref 8.9–10.3)
CO2: 22 mmol/L (ref 22–32)
CREATININE: 1.1 mg/dL (ref 0.61–1.24)
Chloride: 108 mmol/L (ref 101–111)
GFR calc Af Amer: >60 mL/min (ref >60)
GFR calc non Af Amer: >60 mL/min (ref >60)
GLUCOSE: 146 mg/dL — AB (ref 65–99)
Potassium: 4.1 mmol/L (ref 3.5–5.1)
SODIUM: 141 mmol/L (ref 135–145)
Total Protein: 7.1 g/dL (ref 6.5–8.1)

## 2018-01-24 MED ORDER — SODIUM CHLORIDE 0.9 % IV BOLUS
1000.0000 mL | Freq: Once | INTRAVENOUS | Status: AC
  Administered 2018-01-24: 1000 mL via INTRAVENOUS

## 2018-01-24 MED ORDER — ONDANSETRON HCL 4 MG/2ML IJ SOLN
4.0000 mg | Freq: Once | INTRAMUSCULAR | Status: AC
  Administered 2018-01-24: 4 mg via INTRAVENOUS
  Filled 2018-01-24: qty 2

## 2018-01-24 MED ORDER — PROMETHAZINE HCL 25 MG PO TABS: 25.0000 mg | ORAL_TABLET | Freq: Four times a day (QID) | ORAL | 0 refills | Status: DC | PRN

## 2018-01-24 NOTE — Progress Notes (Signed)
Cardiology Office Note Date:  01/24/2018   ID:  Curtis Rosales, DOB Aug 20, 1961, MRN [id number]  PCP:  Ambrose Mantle, MD  Cardiologist:  [name], MD    Chief Complaint  Patient presents with  . Loss of Consciousness     History of Present Illness: Curtis Rosales is a 57 y.o. male who presents for scheduled follow-up evaluation.  The patient has coronary artery disease, initially presenting with an anterolateral STEMI in 2013 complicated by out of hospital ventricular fibrillation arrest.  He underwent primary PCI of the LAD and balloon angioplasty of the first diagonal.  LV function normalized after revascularization.  The patient has done well over the last several years.  He presents today for his annual follow-up exam.  He woke up at 430 this morning with GI symptoms of nausea, vomiting, and diarrhea.  He has not had a lot of vomiting but felt "sick to his stomach."  Today when he was sitting in the exam room, prior to any evaluation he had frank syncope and fell off the exam table onto the floor making a loud noise.  We ran into the room and he was conscious but drowsy.  He was able to respond to questions.  Initially his radial pulse was easily palpable but he was clearly quite bradycardic.  He was conversant throughout.  The patient's skin was very diaphoretic.  He was placed on a cardiac monitor demonstrating normal sinus rhythm.  The patient had signs of hypotension with a systolic blood pressure of 84 mmHg.  EMS was called and an IV was started with wide open IV fluids.  He continued to have episodes of vomiting here in the office.  He denied any chest pain or shortness of breath.    Past Medical History:  Diagnosis Date  . CAD (coronary artery disease)    a. 07/2012 Anterolateral MI/Cath: LAD 100 100%-> 3.0x16 Promus Element DES, otw nonobs dzs, EF 30%  . Ischemic cardiomyopathy    a. 07/2012 Echo: EF 25-30% w/ mid ant, basal inf, sept Sev HK & apical, dist post, mid/dist inf, dist  ant, dist lat AK. Gr 1 DD, mild MR.  . MI (myocardial infarction) (HCC)   . VF (ventricular fibrillation) (HCC)    a. in setting of MI 07/2012    Past Surgical History:  Procedure Laterality Date  . APPENDECTOMY    . CARDIAC CATHETERIZATION    . LEFT HEART CATHETERIZATION WITH CORONARY ANGIOGRAM N/A 07/08/2012   Procedure: LEFT HEART CATHETERIZATION WITH CORONARY ANGIOGRAM;  Surgeon: [name], MD;  Location: [facility] CATH LAB;  Service: Cardiovascular;  Laterality: N/A;  . PERCUTANEOUS CORONARY STENT INTERVENTION (PCI-S)  07/08/2012   Procedure: PERCUTANEOUS CORONARY STENT INTERVENTION (PCI-S);  Surgeon: [name], MD;  Location: [facility] CATH LAB;  Service: Cardiovascular;;    Current Outpatient Medications  Medication Sig Dispense Refill  . aspirin EC 81 MG tablet Take 1 tablet (81 mg total) by mouth daily.    . carvedilol (COREG) 3.125 MG tablet Take 1 tablet (3.125 mg total) by mouth 2 (two) times daily. Please keep upcoming appt for future refills. Thank you 180 tablet 0  . nitroGLYCERIN (NITROSTAT) 0.4 MG SL tablet Place 1 tablet (0.4 mg total) under the tongue every 5 (five) minutes as needed for chest pain. 75 tablet 0  . omeprazole (PRILOSEC OTC) 20 MG tablet Take 20 mg by mouth daily.    . pravastatin (PRAVACHOL) 40 MG tablet Take 1 tablet (40 mg total) by mouth daily.  Please keep upcoming appt for future refills. Thank you 90 tablet 0   No current facility-administered medications for this visit.     Allergies:   Patient has no known allergies.   Social History:  The patient  reports that he has never smoked. He has never used smokeless tobacco. He reports that he does not drink alcohol or use drugs.   Family History:  The patient's  family history includes Heart attack (age of onset: 3740) in his other; Heart attack (age of onset: 4545) in his sister; Heart attack (age of onset: 4855) in his father.   ROS:  Please see the history of present illness.   All other systems are reviewed  and negative.   PHYSICAL EXAM: VS:  BP 112/82   Pulse 89   Ht 5\' 8"  (1.727 m)   Wt 205 lb (93 kg)   SpO2 96%   BMI 31.17 kg/m  , BMI Body mass index is 31.17 kg/m. GEN: Well nourished, well developed, diaphoretic male, active nausea and vomiting HEENT: normal  Neck: no JVD, no masses. No carotid bruits Cardiac: RRR without murmur or gallop      Respiratory:  clear to auscultation bilaterally, normal work of breathing GI: soft, nontender, nondistended, + BS MS: no deformity or atrophy  Ext: no pretibial edema, pedal pulses 2+= bilaterally Skin: diaphoretic Neuro:  Strength and sensation are intact  EKG:  EKG is ordered today. The ekg ordered today shows NSR, no significant ST-T changes  Recent Labs: 01/28/2017: ALT 23; BUN 13; Creatinine, Ser 0.86; Potassium 4.8; Sodium 143   Lipid Panel     Component Value Date/Time   CHOL 151 01/28/2017 0735   TRIG 313 (H) 01/28/2017 0735   HDL 27 (L) 01/28/2017 0735   CHOLHDL 5.6 (H) 01/28/2017 0735   CHOLHDL 4.9 11/15/2015 0933   VLDL 44 (H) 11/15/2015 0933   LDLCALC 61 01/28/2017 0735   LDLDIRECT 81.1 09/21/2014 0834      Wt Readings from Last 3 Encounters:  01/24/18 205 lb (93 kg)  01/22/17 210 lb 3.2 oz (95.3 kg)  11/15/15 206 lb 6.4 oz (93.6 kg)    ASSESSMENT AND PLAN: 1.  Syncope: The patient had frank syncope here in the office, fell off the exam table onto the floor.  He is awake and alert as he is leaving the office by EMS.  I suspect he is volume deplete with his gastrointestinal illness and then had a vagal reaction.  There were periods of bradycardia while we monitored him here in the office, but this was occurring in the setting of active nausea and vomiting.  He will require further evaluation with lab work in the emergency room.  It appears that he hit his forehead when he passed out and may need neuro imaging with a CT scan but will defer to the emergency room physician.  We did not identify any major arrhythmia while  he was being monitored.  2.  Coronary artery disease, native vessel: The patient has had no recent angina.  His EKG here in the office is nonacute.  A copy of his EKG was made and sent with EMS.  3.  Hypotension: Again suspect volume depletion and vagal reaction are contributing.  IV fluids are running.  He will be further treated in the emergency department.  4.  Hyperlipidemia: Treated with pravastatin.  Current medicines are reviewed with the patient today.  The patient does not have concerns regarding medicines.  Labs/ tests ordered today  include:   Orders Placed This Encounter  Procedures  . EKG 12-Lead    Disposition:   Today's focus was on resuscitation and emergency management after the patient had frank syncope complicated by hypotension and bradycardia.  EMS was transporting to the patient to the emergency room.  Once he recovers from his acute illness we will bring him back for his regular cardiology follow-up of chronic medical problems outlined above.  Signed, [name], MD  01/24/2018 11:54 AM    [facility] [address] [place], [place], [place]  [address] Phone: [phone]; Fax: [phone]

## 2018-01-24 NOTE — Discharge Instructions (Signed)
Phenergan for nausea / vomtiing Drink plenty of fluids ER for worsening symtpoms 24 hours of bedrest.

## 2018-01-24 NOTE — Addendum Note (Signed)
Addended by: Levi AlandSWINYER, Starsha Morning M on: 01/24/2018 12:08 PM   Modules accepted: Orders

## 2018-01-24 NOTE — ED Provider Notes (Signed)
[facility] EMERGENCY DEPARTMENT Provider Note   CSN: [id number] Arrival date & time: 01/24/18  1226     History   Chief Complaint Chief Complaint  Patient presents with  . Loss of Consciousness  . Emesis    HPI Curtis Rosales is a 57 y.o. male.  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  He does take a daily aspirin.  He presents today from the cardiologist office where he was there for a follow-up.  He did not have any cardiology complaints, this was a routine exam however the patient does note that he woke up this morning with both vomiting and diarrhea having multiple episodes of each.  He denies abdominal cramping or pain but does note that while he was in the office he became nauseated and while he was throwing up his wife stepped out of the room to go get help, when she came back and he was face down on the floor with a contusion to the right forehead.  The patient came around after approximately 10 seconds, he denied any symptoms at all when he came around, there was no postictal phase, he was answering questions appropriately, he has been up and able to walk.  He was transported by EMS to the hospital for further evaluation.  Of note the patient did have an EKG performed both before and after the syncopal event and both were normal according to the paramedics based on what the cardiologist had told them.  At this time the patient has no palpitations, no chest pain, no shortness of breath, a mild headache on the right, no blurred vision, no numbness weakness abdominal pain and denies any blood in his stools.  He denies any recent travel, denies any recent sick contacts, denies any recent  antibiotics.  Past Medical History:  Diagnosis Date  . CAD (coronary artery disease)    a. 07/2012 Anterolateral MI/Cath: LAD 100 100%-> 3.0x16 Promus Element DES, otw nonobs dzs, EF 30%  . Cardiac arrest (HCC)   . Ischemic cardiomyopathy    a. 07/2012 Echo: EF 25-30% w/ mid ant, basal inf, sept Sev HK & apical, dist post, mid/dist inf, dist ant, dist lat AK. Gr 1 DD, mild MR.  . MI (myocardial infarction) (HCC)   . VF (ventricular fibrillation) (HCC)    a. in setting of MI 07/2012    Patient Active Problem List   Diagnosis Date Noted  . Hypertriglyceridemia 02/25/2015  . Pure hypercholesterolemia 09/14/2014  . Acute MI, anterolateral wall, initial episode of care (HCC) 07/12/2012  . Cardiomyopathy, ischemic 07/12/2012  . CAD (coronary artery disease) 07/12/2012  . Ventricular fibrillation (HCC) 07/08/2012    Past Surgical History:  Procedure Laterality Date  . APPENDECTOMY    . CARDIAC CATHETERIZATION    . LEFT HEART CATHETERIZATION WITH CORONARY ANGIOGRAM N/A 07/08/2012   Procedure: LEFT HEART CATHETERIZATION WITH CORONARY ANGIOGRAM;  Surgeon: [name], MD;  Location: [facility] C[facility] LAB;  Service: Cardiovascular;  Laterality: N/A;  . PERCUTANEOUS CORONARY STENT INTERVENTION (PCI-S)  07/08/2012   Procedure: PERCUTANEOUS CORONARY STENT INTERVENTION (PCI-S);  Surgeon: [name], MD;  Location: [facility] CATH LAB;  Service: Cardiovascular;;  Home Medications    Prior to Admission medications   Medication Sig Start Date End Date Taking? Authorizing Provider  acetaminophen (TYLENOL) 500 MG tablet Take 1,000 mg by mouth every 6 (six) hours as needed for mild pain.   Yes [provider]  aspirin EC 81 MG tablet Take 1 tablet (81 mg total) by mouth daily. 07/20/13  Yes [name], MD  carvedilol (COREG) 3.125 MG tablet Take 1 tablet (3.125 mg total) by mouth 2 (two) times daily. Please keep upcoming appt for future refills. Thank you 01/20/18  Yes [name], MD    nitroGLYCERIN (NITROSTAT) 0.4 MG SL tablet Place 1 tablet (0.4 mg total) under the tongue every 5 (five) minutes as needed for chest pain. 01/20/18  Yes [name], MD  omeprazole (PRILOSEC OTC) 20 MG tablet Take 20 mg by mouth daily.   Yes [provider]  pravastatin (PRAVACHOL) 40 MG tablet Take 1 tablet (40 mg total) by mouth daily. Please keep upcoming appt for future refills. Thank you 01/20/18  Yes [name], MD  promethazine (PHENERGAN) 25 MG tablet Take 1 tablet (25 mg total) by mouth every 6 (six) hours as needed for nausea or vomiting. 01/24/18   Eber Hong, MD    Family History Family History  Problem Relation Age of Onset  . Heart attack Father 47  . Heart attack Sister 6  . Heart attack Other 40    Social History Social History   Tobacco Use  . Smoking status: Never Smoker  . Smokeless tobacco: Never Used  Substance Use Topics  . Alcohol use: No  . Drug use: No     Allergies   Patient has no known allergies.   Review of Systems Review of Systems  All other systems reviewed and are negative.    Physical Exam Updated Vital Signs BP 113/70   Pulse 87   Temp 99.2 F (37.3 C) (Oral)   Resp 20   Ht 5\' 8"  (1.727 m)   Wt 93 kg (205 lb)   SpO2 97%   BMI 31.17 kg/m   Physical Exam  Constitutional: He appears well-developed and well-nourished. No distress.  HENT:  Head: Normocephalic.  Mouth/Throat: No oropharyngeal exudate.  Slight contusion to the right forehead at the hairline otherwise no signs of trauma, oropharynx is mildly dehydrated  Eyes: Pupils are equal, round, and reactive to light. Conjunctivae and EOM are normal. Right eye exhibits no discharge. Left eye exhibits no discharge. No scleral icterus.  Neck: Normal range of motion. Neck supple. No JVD present. No thyromegaly present.  Cardiovascular: Normal rate, regular rhythm, normal heart sounds and intact distal pulses. Exam reveals no gallop and no friction rub.  No  murmur heard. Pulmonary/Chest: Effort normal and breath sounds normal. No respiratory distress. He has no wheezes. He has no rales.  Abdominal: Soft. He exhibits no distension and no mass. There is no tenderness.  Slight increased bowel sounds but soft and nontender  Musculoskeletal: Normal range of motion. He exhibits no edema or tenderness.  Lymphadenopathy:    He has no cervical adenopathy.  Neurological: He is alert. Coordination normal.  Skin: Skin is warm and dry. No rash noted. No erythema.  Psychiatric: He has a normal mood and affect. His behavior is normal.  Nursing note and vitals reviewed.   ED Treatments / Results  Labs (all labs ordered are listed, but only abnormal results are displayed) Labs Reviewed  COMPREHENSIVE METABOLIC PANEL - Abnormal; Notable for the following components:  Result Value   Glucose, Bld 146 (*)    All other components within normal limits  CBC WITH DIFFERENTIAL/PLATELET - Abnormal; Notable for the following components:   WBC 12.9 (*)    Neutro Abs 11.7 (*)    All other components within normal limits    EKG EKG Interpretation  Date/Time:  Monday January 24 2018 12:54:59 EDT Ventricular Rate:  88 PR Interval:    QRS Duration: 107 QT Interval:  376 QTC Calculation: 455 R Axis:   59 Text Interpretation:  Sinus rhythm since last tracing no significant change Confirmed by Eber HongMiller, Machel Violante ([id number]) on 01/24/2018 2:22:29 PM   Radiology No results found.  Procedures Procedures (including critical care time)  Medications Ordered in ED Medications  ondansetron (ZOFRAN) injection 4 mg (4 mg Intravenous Given 01/24/18 1355)  sodium chloride 0.9 % bolus 1,000 mL (1,000 mLs Intravenous Given 01/24/18 1354)     Initial Impression / Assessment and Plan / ED Course  I have reviewed the triage vital signs and the nursing notes.  Pertinent labs & imaging results that were available during my care of the patient were reviewed by me and considered  in my medical decision making (see chart for details).    The patient appears well at this time, I suspect that he had a syncopal episode secondary to being dehydrated from vomiting and diarrhea through the night.  His EKG here is normal, he will get fluids labs and a recheck.  The patient is agreeable to this plan.  He does not have any significant headache or neurologic symptoms to suggest the need for a CT scan of the head after the fall with a minor head injury.  The patient has a leukocytosis which seems to be isolated, metabolic panel is normal including renal function, IV fluids been given, vital signs have normalized, he feels stable improved and ready for discharge.  I see no signs of cardiac dysfunction, this was communicated to the patient who expressed his understanding to the plan.  Final Clinical Impressions(s) / ED Diagnoses   Final diagnoses:  Vasovagal syncope  Nausea vomiting and diarrhea    ED Discharge Orders        Ordered    promethazine (PHENERGAN) 25 MG tablet  Every 6 hours PRN     01/24/18 1601       Eber HongMiller, Numan Zylstra, MD 01/24/18 [phone]

## 2018-01-24 NOTE — ED Triage Notes (Signed)
Pt to ED via [facility] from Dr. Earmon Phoenixooper's office (was there for routine visit) pt had a syncopal episode at office-- states that he started having diarrhea at approx 4am, then vomiting, - then both at same time-- watery diarrhea--  Pt's heart rate at office was in 30's= IV started and 200cc NS infused.  On arrival- pt alert/oriented x 4, w/d- wife at bedside

## 2018-01-26 ENCOUNTER — Telehealth: Payer: Self-pay | Admitting: Cardiovascular Disease

## 2018-01-26 NOTE — Telephone Encounter (Signed)
Patient called to reschedule appointment. He had an appointment with Dr. Excell Seltzerooper on Monday but got sick and fainted during the visit. He was sent to the ED for evaluation. He states he was told he was severely dehydrated and was given fluids. He was discharged same day. He now feels fine and is asymptomatic.  He requests to be rescheduled for check-up. Scheduled him 4/19 with Tereso NewcomerScott Weaver. He was grateful for call and agrees with treatment plan.

## 2018-01-26 NOTE — Telephone Encounter (Signed)
Misty StanleyLisa ( Wife) is calling to find out if Mr. Curtis Rosales can race . Also Mr. Curtis Rosales passed out on Monday while at the appointment to see Dr. Excell Seltzerooper . She wants to know do they schedule another visit or what the next step is . Please call

## 2018-02-17 NOTE — Progress Notes (Deleted)
Cardiology Office Note:    Date:  02/17/2018   ID:  Curtis Rosales, DOB 19-Dec-1960, MRN [id number]  PCP:  Ambrose MantleWillet, Glenn R, MD  Cardiologist:  No primary care provider on file.   Referring MD: Ambrose MantleWillet, Glenn R, MD   No chief complaint on file. ***  History of Present Illness:    Curtis Rosales is a 57 y.o. male with coronary artery disease status post anterolateral ST elevation myocardial infarction in 2013 complicated by out of hospital ventricular fibrillation arrest treated with primary PCI of the LAD and balloon angioplasty of the first diagonal.  EF was initially 25-30 but did improve to normal.  He was last seen by Dr. Excell Seltzerooper 01/24/18.  He had experienced nausea, vomiting and diarrhea earlier that morning.  While waiting in the exam room, he had a syncopal episode and was noted to be hypotensive.  He was sent to the emergency room for further evaluation and treatment.  In the emergency room, he received IV fluids.  His labs were unremarkable.  His ECG demonstrated normal sinus rhythm without acute change.  He was discharged home.    Mr. Curtis Rosales ***  Prior CV studies:   The following studies were reviewed today:  Nuclear stress test 01/28/17 EF 54, normal perfusion  Nuclear stress test 04/03/14 EF 53, no ischemia  Echo 01/03/13 Mild LVH, mild focal basal septal hypertrophy, EF 50-55, anteroseptal hypokinesis, inferior hypokinesis, grade 1 diastolic dysfunction, mild LAE  Echo 08/19/12 Mild LVH, EF 40-45, grade 1 diastolic dysfunction, mild MR, mild LAE  Echo 07/11/12 EF 25-30  Cardiac catheterization 07/08/12 LM irregularity LAD proximal occluded LCx proximal AV groove 30 RCA no significant disease EF 30 PCI: 3 x 16 mm Promus Element DES to the LAD PCI: POBA to D1  Past Medical History:  Diagnosis Date  . CAD (coronary artery disease)    a. 07/2012 Anterolateral MI/Cath: LAD 100 100%-> 3.0x16 Promus Element DES, otw nonobs dzs, EF 30%  . Cardiac arrest (HCC)   . Ischemic cardiomyopathy      a. 07/2012 Echo: EF 25-30% w/ mid ant, basal inf, sept Sev HK & apical, dist post, mid/dist inf, dist ant, dist lat AK. Gr 1 DD, mild MR.  . MI (myocardial infarction) (HCC)   . VF (ventricular fibrillation) (HCC)    a. in setting of MI 07/2012   Surgical Hx: The patient  has a past surgical history that includes Appendectomy; Cardiac catheterization; left heart catheterization with coronary angiogram (N/A, 07/08/2012); and percutaneous coronary stent intervention (pci-s) (07/08/2012).   Current Medications: No outpatient medications have been marked as taking for the 02/18/18 encounter (Appointment) with Tereso NewcomerWeaver, Eligha Kmetz T, PA-C.     Allergies:   Patient has no known allergies.   Social History   Tobacco Use  . Smoking status: Never Smoker  . Smokeless tobacco: Never Used  Substance Use Topics  . Alcohol use: No  . Drug use: No     Family Hx: The patient's family history includes Heart attack (age of onset: 3240) in his other; Heart attack (age of onset: 10945) in his sister; Heart attack (age of onset: 5655) in his father.  ROS:   Please see the history of present illness.    ROS All other systems reviewed and are negative.   EKGs/Labs/Other Test Reviewed:    EKG:  EKG is *** ordered today.  The ekg ordered today demonstrates ***  Recent Labs: 01/24/2018: ALT 23; BUN 14; Creatinine, Ser 1.10; Hemoglobin 15.6; Platelets 243; Potassium  4.1; Sodium 141   Recent Lipid Panel Lab Results  Component Value Date/Time   CHOL 151 01/28/2017 07:35 AM   TRIG 313 (H) 01/28/2017 07:35 AM   HDL 27 (L) 01/28/2017 07:35 AM   CHOLHDL 5.6 (H) 01/28/2017 07:35 AM   CHOLHDL 4.9 11/15/2015 09:33 AM   LDLCALC 61 01/28/2017 07:35 AM   LDLDIRECT 81.1 09/21/2014 08:34 AM    Physical Exam:    VS:  There were no vitals taken for this visit.    Wt Readings from Last 3 Encounters:  01/24/18 205 lb (93 kg)  01/24/18 205 lb (93 kg)  01/22/17 210 lb 3.2 oz (95.3 kg)     ***Physical Exam  ASSESSMENT &  PLAN:    Coronary artery disease involving native coronary artery of native heart without angina pectoris  Pure hypercholesterolemia***  Dispo:  No follow-ups on file.   Medication Adjustments/Labs and Tests Ordered: Current medicines are reviewed at length with the patient today.  Concerns regarding medicines are outlined above.  Tests Ordered: No orders of the defined types were placed in this encounter.  Medication Changes: No orders of the defined types were placed in this encounter.   Signed, Tereso Newcomer, PA-C  02/17/2018 5:09 PM    [facility] [address] [place], [place], [place]  [address] Phone: [phone]; Fax: [phone]

## 2018-02-18 ENCOUNTER — Ambulatory Visit: Payer: Self-pay | Admitting: Physician Assistant

## 2018-02-18 DIAGNOSIS — R0989 Other specified symptoms and signs involving the circulatory and respiratory systems: Secondary | ICD-10-CM

## 2018-02-21 ENCOUNTER — Encounter: Payer: Self-pay | Admitting: Physician Assistant

## 2018-04-27 ENCOUNTER — Other Ambulatory Visit: Payer: Self-pay | Admitting: Cardiovascular Disease

## 2018-05-27 ENCOUNTER — Other Ambulatory Visit: Payer: Self-pay | Admitting: Cardiovascular Disease

## 2018-07-13 ENCOUNTER — Encounter: Payer: Self-pay | Admitting: Cardiovascular Disease

## 2018-07-13 ENCOUNTER — Ambulatory Visit (INDEPENDENT_AMBULATORY_CARE_PROVIDER_SITE_OTHER): Payer: Self-pay | Admitting: Cardiovascular Disease

## 2018-07-13 VITALS — BP 108/80 | HR 71 | Ht 68.0 in | Wt 197.4 lb

## 2018-07-13 DIAGNOSIS — I2510 Atherosclerotic heart disease of native coronary artery without angina pectoris: Secondary | ICD-10-CM

## 2018-07-13 NOTE — Patient Instructions (Signed)
Medication Instructions:  Your provider recommends that you continue on your current medications as directed. Please refer to the Current Medication list given to you today.    Labwork: Your provider recommends that you return for FASTING lab work.    Testing/Procedures: None  Follow-Up: Your provider wants you to follow-up in: 1 year with Dr. Cooper. You will receive a reminder letter in the mail two months in advance. If you don't receive a letter, please call our office to schedule the follow-up appointment.    Any Other Special Instructions Will Be Listed Below (If Applicable).     If you need a refill on your cardiac medications before your next appointment, please call your pharmacy.   

## 2018-07-13 NOTE — Progress Notes (Signed)
Cardiology Office Note Date:  07/14/2018   ID:  Curtis Rosales, DOB 01-11-1961, MRN [id number]  PCP:  Ambrose Mantle, MD  Cardiologist:  [name], MD    Chief Complaint  Patient presents with  . Follow-up    CAD     History of Present Illness: Curtis Rosales is a 57 y.o. male who presents for follow-up evaluation. The patient has been followed for coronary artery disease. He initially presented in 2013 with an anterolateral STEMI complicated by out of hospital ventricular fibrillation arrest. He underwent primary PCI the LAD and balloon angioplasty of the diagonal. His LVEF normalized on follow-up.   He's here with his son today. Reports that he's been doing very well and has no specific complaints today. Remains active with his work. Today, he denies symptoms of palpitations, chest pain, shortness of breath, orthopnea, PND, lower extremity edema, dizziness, or syncope.    Past Medical History:  Diagnosis Date  . CAD (coronary artery disease)    a. 07/2012 Anterolateral MI/Cath: LAD 100 100%-> 3.0x16 Promus Element DES, otw nonobs dzs, EF 30%  . Cardiac arrest (HCC)   . Ischemic cardiomyopathy    a. 07/2012 Echo: EF 25-30% w/ mid ant, basal inf, sept Sev HK & apical, dist post, mid/dist inf, dist ant, dist lat AK. Gr 1 DD, mild MR.  . MI (myocardial infarction) (HCC)   . VF (ventricular fibrillation) (HCC)    a. in setting of MI 07/2012    Past Surgical History:  Procedure Laterality Date  . APPENDECTOMY    . CARDIAC CATHETERIZATION    . LEFT HEART CATHETERIZATION WITH CORONARY ANGIOGRAM N/A 07/08/2012   Procedure: LEFT HEART CATHETERIZATION WITH CORONARY ANGIOGRAM;  Surgeon: [name], MD;  Location: [facility] CATH LAB;  Service: Cardiovascular;  Laterality: N/A;  . PERCUTANEOUS CORONARY STENT INTERVENTION (PCI-S)  07/08/2012   Procedure: PERCUTANEOUS CORONARY STENT INTERVENTION (PCI-S);  Surgeon: [name], MD;  Location: [facility] CATH LAB;  Service: Cardiovascular;;    Current  Outpatient Medications  Medication Sig Dispense Refill  . acetaminophen (TYLENOL) 500 MG tablet Take 1,000 mg by mouth every 6 (six) hours as needed for mild pain.    Marland Kitchen aspirin EC 81 MG tablet Take 1 tablet (81 mg total) by mouth daily.    . carvedilol (COREG) 3.125 MG tablet TAKE 1 TABLET BY MOUTH TWICE DAILY 180 tablet 2  . nitroGLYCERIN (NITROSTAT) 0.4 MG SL tablet Place 1 tablet (0.4 mg total) under the tongue every 5 (five) minutes as needed for chest pain. 75 tablet 0  . omeprazole (PRILOSEC OTC) 20 MG tablet Take 20 mg by mouth daily.    . pravastatin (PRAVACHOL) 40 MG tablet Take 1 tablet (40 mg total) by mouth daily. 90 tablet 2  . promethazine (PHENERGAN) 25 MG tablet Take 1 tablet (25 mg total) by mouth every 6 (six) hours as needed for nausea or vomiting. 12 tablet 0   No current facility-administered medications for this visit.     Allergies:   Patient has no known allergies.   Social History:  The patient  reports that he has never smoked. He has never used smokeless tobacco. He reports that he does not drink alcohol or use drugs.   Family History:  The patient's family history includes Heart attack (age of onset: 38) in his other; Heart attack (age of onset: 17) in his sister; Heart attack (age of onset: 43) in his father.    ROS:  Please see the history of present  illness.  Otherwise, review of systems is positive for back pain.  All other systems are reviewed and negative.    PHYSICAL EXAM: VS:  BP 108/80   Pulse 71   Ht 5\' 8"  (1.727 m)   Wt 197 lb 6.4 oz (89.5 kg)   SpO2 93%   BMI 30.01 kg/m  , BMI Body mass index is 30.01 kg/m. GEN: Well nourished, well developed, in no acute distress  HEENT: normal  Neck: no JVD, no masses. No carotid bruits Cardiac: RRR without murmur or gallop                Respiratory:  clear to auscultation bilaterally, normal work of breathing GI: soft, nontender, nondistended, + BS MS: no deformity or atrophy  Ext: no pretibial edema,  pedal pulses 2+= bilaterally Skin: warm and dry, no rash Neuro:  Strength and sensation are intact Psych: euthymic mood, full affect  EKG:  EKG is ordered today. The ekg ordered today shows NSR 71 bpm, within normal limits  Recent Labs: 01/24/2018: ALT 23; BUN 14; Creatinine, Ser 1.10; Hemoglobin 15.6; Platelets 243; Potassium 4.1; Sodium 141   Lipid Panel     Component Value Date/Time   CHOL 151 01/28/2017 0735   TRIG 313 (H) 01/28/2017 0735   HDL 27 (L) 01/28/2017 0735   CHOLHDL 5.6 (H) 01/28/2017 0735   CHOLHDL 4.9 11/15/2015 0933   VLDL 44 (H) 11/15/2015 0933   LDLCALC 61 01/28/2017 0735   LDLDIRECT 81.1 09/21/2014 0834      Wt Readings from Last 3 Encounters:  07/13/18 197 lb 6.4 oz (89.5 kg)  01/24/18 205 lb (93 kg)  01/24/18 205 lb (93 kg)     Cardiac Studies Reviewed: Myocardial Perfusion Scan 01-28-2017: Study Highlights    Pt walked for 8:30 of a Bruce protocol GXt. Peak HR of 153 which is 93% predicted maximal HR  There were no ST or T wave changes to suggest ischemia.  This is a low risk study.  Nuclear stress EF: 54%. The left ventricular ejection fraction is normal (55-65%).  The study is normal.     ASSESSMENT AND PLAN: 1.  Coronary artery disease, native vessel, without angina: The patient is stable on aspirin for antiplatelet therapy, low-dose carvedilol, and pravastatin.  No changes are made today.  2.  Mixed hyperlipidemia: Treated with pravastatin 40 mg daily.  Last lipids reviewed which demonstrated hypertriglyceridemia but LDL cholesterol was at goal.  He is due for updated labs and these will be scheduled as he is nonfasting today.  We discussed lifestyle modification and exercise as a treatment for his hypertriglyceridemia.  Current medicines are reviewed with the patient today.  The patient does not have concerns regarding medicines.  Labs/ tests ordered today include:   Orders Placed This Encounter  Procedures  . Comprehensive  metabolic panel  . Lipid panel  . EKG 12-Lead    Disposition:   FU one year  Signed, [name], MD  07/14/2018 8:27 AM    [facility] [address] [place], [place], [place]  [address] Phone: [phone]; Fax: [phone]

## 2018-07-29 ENCOUNTER — Other Ambulatory Visit: Payer: Self-pay | Admitting: *Deleted

## 2018-07-29 DIAGNOSIS — I2510 Atherosclerotic heart disease of native coronary artery without angina pectoris: Secondary | ICD-10-CM

## 2018-07-29 LAB — LIPID PANEL
CHOLESTEROL TOTAL: 143 mg/dL (ref 100–199)
Chol/HDL Ratio: 4.8 ratio (ref 0.0–5.0)
HDL: 30 mg/dL — ABNORMAL LOW (ref >39)
LDL CALC: 75 mg/dL (ref 0–99)
TRIGLYCERIDES: 192 mg/dL — AB (ref 0–149)
VLDL Cholesterol Cal: 38 mg/dL (ref 5–40)

## 2018-07-29 LAB — COMPREHENSIVE METABOLIC PANEL
ALK PHOS: 76 IU/L (ref 39–117)
ALT: 18 IU/L (ref 0–44)
AST: 21 IU/L (ref 0–40)
Albumin/Globulin Ratio: 1.8 (ref 1.2–2.2)
Albumin: 4.4 g/dL (ref 3.5–5.5)
BUN/Creatinine Ratio: 16 (ref 9–20)
BUN: 14 mg/dL (ref 6–24)
Bilirubin Total: 0.5 mg/dL (ref 0.0–1.2)
CO2: 22 mmol/L (ref 20–29)
CREATININE: 0.9 mg/dL (ref 0.76–1.27)
Calcium: 9.3 mg/dL (ref 8.7–10.2)
Chloride: 104 mmol/L (ref 96–106)
GFR calc Af Amer: 110 mL/min/{1.73_m2} (ref >59)
GFR calc non Af Amer: 95 mL/min/{1.73_m2} (ref >59)
GLOBULIN, TOTAL: 2.4 g/dL (ref 1.5–4.5)
GLUCOSE: 114 mg/dL — AB (ref 65–99)
Potassium: 4.2 mmol/L (ref 3.5–5.2)
SODIUM: 141 mmol/L (ref 134–144)
Total Protein: 6.8 g/dL (ref 6.0–8.5)

## 2019-02-22 ENCOUNTER — Other Ambulatory Visit: Payer: Self-pay | Admitting: Cardiovascular Disease

## 2019-03-20 ENCOUNTER — Other Ambulatory Visit: Payer: Self-pay | Admitting: Cardiovascular Disease

## 2019-05-11 ENCOUNTER — Other Ambulatory Visit: Payer: Self-pay | Admitting: Cardiovascular Disease

## 2019-05-11 DIAGNOSIS — R079 Chest pain, unspecified: Secondary | ICD-10-CM

## 2019-05-11 DIAGNOSIS — E7800 Pure hypercholesterolemia, unspecified: Secondary | ICD-10-CM

## 2019-09-07 ENCOUNTER — Other Ambulatory Visit: Payer: Self-pay | Admitting: Cardiovascular Disease

## 2020-01-05 ENCOUNTER — Other Ambulatory Visit: Payer: Self-pay | Admitting: Cardiovascular Disease

## 2020-02-13 ENCOUNTER — Other Ambulatory Visit: Payer: Self-pay | Admitting: Cardiovascular Disease

## 2020-02-15 ENCOUNTER — Other Ambulatory Visit: Payer: Self-pay | Admitting: Cardiovascular Disease

## 2020-02-15 DIAGNOSIS — R079 Chest pain, unspecified: Secondary | ICD-10-CM

## 2020-02-15 DIAGNOSIS — E7800 Pure hypercholesterolemia, unspecified: Secondary | ICD-10-CM

## 2020-02-15 MED ORDER — NITROGLYCERIN 0.4 MG SL SUBL: SUBLINGUAL_TABLET | SUBLINGUAL | 0 refills | Status: DC

## 2020-02-15 NOTE — Telephone Encounter (Signed)
Called pt and left message informing pt that his medication carvedilol and pravastatin has already been sent to his pharmacy and I had sent in his nitroglycerin. If pt could keep his upcoming appt with Dr. Excell Seltzer for 90 day supply and if he has any other problems, questions or concerns, to give our office a call back.

## 2020-02-15 NOTE — Telephone Encounter (Signed)
*  STAT* If patient is at the pharmacy, call can be transferred to refill team.   1. Which medications need to be refilled? (please list name of each medication and dose if known)  carvedilol (COREG) 3.125 MG tablet pravastatin (PRAVACHOL) 40 MG tablet nitroGLYCERIN (NITROSTAT) 0.4 MG SL tablet   2. Which pharmacy/location (including street and city if local pharmacy) is medication to be sent to? [id number] [place],  - [address] [place] [address]  3. Do they need a 30 day or 90 day supply? 90 day

## 2020-02-19 ENCOUNTER — Other Ambulatory Visit: Payer: Self-pay

## 2020-02-19 ENCOUNTER — Ambulatory Visit (INDEPENDENT_AMBULATORY_CARE_PROVIDER_SITE_OTHER): Payer: Self-pay | Admitting: Cardiovascular Disease

## 2020-02-19 ENCOUNTER — Encounter: Payer: Self-pay | Admitting: Cardiovascular Disease

## 2020-02-19 VITALS — BP 124/86 | HR 64 | Ht 68.0 in | Wt 208.0 lb

## 2020-02-19 DIAGNOSIS — I2510 Atherosclerotic heart disease of native coronary artery without angina pectoris: Secondary | ICD-10-CM

## 2020-02-19 DIAGNOSIS — E782 Mixed hyperlipidemia: Secondary | ICD-10-CM

## 2020-02-19 NOTE — Progress Notes (Signed)
Cardiology Office Note:    Date:  02/19/2020   ID:  [name], DOB [birth year], MRN [id number]  PCP:  Ambrose Mantle, MD  Cardiologist:  [name], MD  Electrophysiologist:  None   Referring MD: Ambrose Mantle, MD   Chief Complaint  Patient presents with  . Coronary Artery Disease    History of Present Illness:    Curtis Rosales is a 59 y.o. male with a hx of coronary artery disease, presenting for follow-up evaluation.  The patient has been followed for coronary artery disease. He initially presented in 2013 with an anterolateral STEMI complicated by out of hospital ventricular fibrillation arrest. He underwent primary PCI the LAD and balloon angioplasty of the diagonal. His LVEF normalized on follow-up.  The patient is here alone today.  He is doing well.  He remains active with no exertional symptoms.  He had a fleeting episode of very light chest discomfort a few weeks ago that occurred at rest.  He states this only lasted a few seconds.  It did not feel anything like his heart attack pain.  He otherwise been in his normal state of health.  He reports longstanding mild exertional dyspnea which is unchanged over time.  No orthopnea, PND, heart palpitations, or leg swelling.  No lightheadedness.  Past Medical History:  Diagnosis Date  . CAD (coronary artery disease)    a. 07/2012 Anterolateral MI/Cath: LAD 100 100%-> 3.0x16 Promus Element DES, otw nonobs dzs, EF 30%  . Cardiac arrest (HCC)   . Ischemic cardiomyopathy    a. 07/2012 Echo: EF 25-30% w/ mid ant, basal inf, sept Sev HK & apical, dist post, mid/dist inf, dist ant, dist lat AK. Gr 1 DD, mild MR.  . MI (myocardial infarction) (HCC)   . VF (ventricular fibrillation) (HCC)    a. in setting of MI 07/2012    Past Surgical History:  Procedure Laterality Date  . APPENDECTOMY    . CARDIAC CATHETERIZATION    . LEFT HEART CATHETERIZATION WITH CORONARY ANGIOGRAM N/A 07/08/2012   Procedure: LEFT HEART CATHETERIZATION WITH CORONARY  ANGIOGRAM;  Surgeon: [name], MD;  Location: [facility] CATH LAB;  Service: Cardiovascular;  Laterality: N/A;  . PERCUTANEOUS CORONARY STENT INTERVENTION (PCI-S)  07/08/2012   Procedure: PERCUTANEOUS CORONARY STENT INTERVENTION (PCI-S);  Surgeon: [name], MD;  Location: [facility] CATH LAB;  Service: Cardiovascular;;    Current Medications: Current Meds  Medication Sig  . acetaminophen (TYLENOL) 500 MG tablet Take 1,000 mg by mouth every 6 (six) hours as needed for mild pain.  Marland Kitchen aspirin EC 81 MG tablet Take 1 tablet (81 mg total) by mouth daily.  . carvedilol (COREG) 3.125 MG tablet Take 1 tablet by mouth twice daily  . nitroGLYCERIN (NITROSTAT) 0.4 MG SL tablet DISSOLVE ONE TABLET UNDER THE TONGUE EVERY 5 MINUTES AS NEEDED FOR CHEST PAIN.  DO NOT EXCEED A TOTAL OF 3 DOSES IN 15 MINUTES. Please keep upcoming appt. Thank you  . omeprazole (PRILOSEC OTC) 20 MG tablet Take 20 mg by mouth daily.  . pravastatin (PRAVACHOL) 40 MG tablet Take 1 tablet by mouth once daily  . promethazine (PHENERGAN) 25 MG tablet Take 1 tablet (25 mg total) by mouth every 6 (six) hours as needed for nausea or vomiting.     Allergies:   Patient has no known allergies.   Social History   Socioeconomic History  . Marital status: Married    Spouse name: Not on file  . Number of children: Not on file  .  Years of education: Not on file  . Highest education level: Not on file  Occupational History  . Occupation: Cabin crew  Tobacco Use  . Smoking status: Never Smoker  . Smokeless tobacco: Never Used  Substance and Sexual Activity  . Alcohol use: No  . Drug use: No  . Sexual activity: Not on file    Comment: did not ask  Other Topics Concern  . Not on file  Social History Narrative   He is not able to remember any of his past medical history, other than  He had an appendectomy. He cannot remember any medicines he takes or any conditions for which he receives treatment. He also cannot remember the name of his  physician. Patient lives with wife in [place] and drives racecars for a hobby.   Social Determinants of Health   Financial Resource Strain:   . Difficulty of Paying Living Expenses:   Food Insecurity:   . Worried About Charity fundraiser in the Last Year:   . Arboriculturist in the Last Year:   Transportation Needs:   . Film/video editor (Medical):   Marland Kitchen Lack of Transportation (Non-Medical):   Physical Activity:   . Days of Exercise per Week:   . Minutes of Exercise per Session:   Stress:   . Feeling of Stress :   Social Connections:   . Frequency of Communication with Friends and Family:   . Frequency of Social Gatherings with Friends and Family:   . Attends Religious Services:   . Active Member of Clubs or Organizations:   . Attends Archivist Meetings:   Marland Kitchen Marital Status:      Family History: The patient's family history includes Heart attack (age of onset: 25) in an other family member; Heart attack (age of onset: 52) in his sister; Heart attack (age of onset: 3) in his father.  ROS:   Please see the history of present illness.    All other systems reviewed and are negative.  EKGs/Labs/Other Studies Reviewed:    The following studies were reviewed today: Myocardial Perfusion Scan 01-28-2017: Study Highlights    Pt walked for 8:30 of a Bruce protocol GXt. Peak HR of 153 which is 93% predicted maximal HR  There were no ST or T wave changes to suggest ischemia.  This is a low risk study.  Nuclear stress EF: 54%. The left ventricular ejection fraction is normal (55-65%).  The study is normal.   EKG:  EKG is ordered today.  The ekg ordered today demonstrates NSR 64 bpm, within normal limits  Recent Labs: No results found for requested labs within last 8760 hours.  Recent Lipid Panel    Component Value Date/Time   CHOL 143 07/29/2018 0942   TRIG 192 (H) 07/29/2018 0942   HDL 30 (L) 07/29/2018 0942   CHOLHDL 4.8 07/29/2018 0942   CHOLHDL 4.9  11/15/2015 0933   VLDL 44 (H) 11/15/2015 0933   LDLCALC 75 07/29/2018 0942   LDLDIRECT 81.1 09/21/2014 0834    Physical Exam:    VS:  BP 124/86   Pulse 64   Ht 5\' 8"  (1.727 m)   Wt 208 lb (94.3 kg)   SpO2 97%   BMI 31.63 kg/m     Wt Readings from Last 3 Encounters:  02/19/20 208 lb (94.3 kg)  07/13/18 197 lb 6.4 oz (89.5 kg)  01/24/18 205 lb (93 kg)     GEN:  Well nourished, well developed in  no acute distress HEENT: Normal NECK: No JVD; No carotid bruits LYMPHATICS: No lymphadenopathy CARDIAC: RRR, no murmurs, rubs, gallops RESPIRATORY:  Clear to auscultation without rales, wheezing or rhonchi  ABDOMEN: Soft, non-tender, non-distended MUSCULOSKELETAL:  No edema; No deformity  SKIN: Warm and dry NEUROLOGIC:  Alert and oriented x 3 PSYCHIATRIC:  Normal affect   ASSESSMENT:    1. Coronary artery disease involving native coronary artery of native heart without angina pectoris   2. Mixed hyperlipidemia    PLAN:    In order of problems listed above:  1. Stable medical therapy with aspirin, low-dose beta-blocker, and pravastatin.  Fleeting episode of chest pain does not have any typical features of cardiac-related pain.  He will keep an eye on things and if symptoms recur he will let us know. 2. We will update lipids.  Continue statin therapy.  May be appropriate to switch him to a high intensity statin drug if he can tolerate.  Medication Adjustments/Labs and Tests Ordered: Current medicines are reviewed at length with the patient today.  Concerns regarding medicines are outlined above.  Orders Placed This Encounter  Procedures  . CBC with Differential/Platelet  . Comprehensive metabolic panel  . Lipid panel  . EKG 12-Lead   No orders of the defined types were placed in this encounter.   Patient Instructions  Medication Instructions:  Your provider recommends that you continue on your current medications as directed. Please refer to the Current Medication list  given to you today.   *If you need a refill on your cardiac medications before your next appointment, please call your pharmacy*  Lab Work: TODAY! CBC, CMET, lipids If you have labs (blood work) drawn today and your tests are completely normal, you will receive your results only by: Marland Kitchen MyChart Message (if you have MyChart) OR . A paper copy in the mail If you have any lab test that is abnormal or we need to change your treatment, we will call you to review the results.  Follow-Up: At [facility], you and your health needs are our priority.  As part of our continuing mission to provide you with exceptional heart care, we have created designated Provider Care Teams.  These Care Teams include your primary Cardiologist (physician) and Advanced Practice Providers (APPs -  Physician Assistants and Nurse Practitioners) who all work together to provide you with the care you need, when you need it. Your next appointment:   12 month(s) The format for your next appointment:   In Person Provider:   You may see [name], MD or one of the following Advanced Practice Providers on your designated Care Team:    Tereso Newcomer, PA-C  Chelsea Aus, [place]      Signed, [name], MD  02/19/2020 2:46 PM    [facility]

## 2020-02-19 NOTE — Patient Instructions (Signed)
Medication Instructions:  Your provider recommends that you continue on your current medications as directed. Please refer to the Current Medication list given to you today.   *If you need a refill on your cardiac medications before your next appointment, please call your pharmacy*  Lab Work: TODAY! CBC, CMET, lipids If you have labs (blood work) drawn today and your tests are completely normal, you will receive your results only by: . MyChart Message (if you have MyChart) OR . A paper copy in the mail If you have any lab test that is abnormal or we need to change your treatment, we will call you to review the results.  Follow-Up: At [facility], you and your health needs are our priority.  As part of our continuing mission to provide you with exceptional heart care, we have created designated Provider Care Teams.  These Care Teams include your primary Cardiologist (physician) and Advanced Practice Providers (APPs -  Physician Assistants and Nurse Practitioners) who all work together to provide you with the care you need, when you need it. Your next appointment:   12 month(s) The format for your next appointment:   In Person Provider:   You may see Michael Cooper, MD or one of the following Advanced Practice Providers on your designated Care Team:    Scott Weaver, PA-C  Vin Bhagat, PA-C   

## 2020-02-20 LAB — CBC WITH DIFFERENTIAL/PLATELET
Basophils Absolute: 0.1 10*3/uL (ref 0.0–0.2)
Basos: 1 %
EOS (ABSOLUTE): 0.3 10*3/uL (ref 0.0–0.4)
Eos: 3 %
Hematocrit: 45.7 % (ref 37.5–51.0)
Hemoglobin: 15.2 g/dL (ref 13.0–17.7)
Immature Grans (Abs): 0 10*3/uL (ref 0.0–0.1)
Immature Granulocytes: 0 %
Lymphocytes Absolute: 3 10*3/uL (ref 0.7–3.1)
Lymphs: 28 %
MCH: 27.4 pg (ref 26.6–33.0)
MCHC: 33.3 g/dL (ref 31.5–35.7)
MCV: 83 fL (ref 79–97)
Monocytes Absolute: 0.8 10*3/uL (ref 0.1–0.9)
Monocytes: 8 %
Neutrophils Absolute: 6.3 10*3/uL (ref 1.4–7.0)
Neutrophils: 60 %
Platelets: 299 10*3/uL (ref 150–450)
RBC: 5.54 x10E6/uL (ref 4.14–5.80)
RDW: 14 % (ref 11.6–15.4)
WBC: 10.4 10*3/uL (ref 3.4–10.8)

## 2020-02-20 LAB — COMPREHENSIVE METABOLIC PANEL
ALT: 27 IU/L (ref 0–44)
AST: 25 IU/L (ref 0–40)
Albumin/Globulin Ratio: 2 (ref 1.2–2.2)
Albumin: 4.5 g/dL (ref 3.8–4.9)
Alkaline Phosphatase: 90 IU/L (ref 39–117)
BUN/Creatinine Ratio: 14 (ref 9–20)
BUN: 14 mg/dL (ref 6–24)
Bilirubin Total: 0.3 mg/dL (ref 0.0–1.2)
CO2: 21 mmol/L (ref 20–29)
Calcium: 9.4 mg/dL (ref 8.7–10.2)
Chloride: 104 mmol/L (ref 96–106)
Creatinine, Ser: 1.03 mg/dL (ref 0.76–1.27)
GFR calc Af Amer: 92 mL/min/{1.73_m2} (ref >59)
GFR calc non Af Amer: 80 mL/min/{1.73_m2} (ref >59)
Globulin, Total: 2.3 g/dL (ref 1.5–4.5)
Glucose: 89 mg/dL (ref 65–99)
Potassium: 4.3 mmol/L (ref 3.5–5.2)
Sodium: 141 mmol/L (ref 134–144)
Total Protein: 6.8 g/dL (ref 6.0–8.5)

## 2020-02-20 LAB — LIPID PANEL
Chol/HDL Ratio: 5.9 ratio — ABNORMAL HIGH (ref 0.0–5.0)
Cholesterol, Total: 159 mg/dL (ref 100–199)
HDL: 27 mg/dL — ABNORMAL LOW (ref >39)
LDL Chol Calc (NIH): 58 mg/dL (ref 0–99)
Triglycerides: 487 mg/dL — ABNORMAL HIGH (ref 0–149)
VLDL Cholesterol Cal: 74 mg/dL — ABNORMAL HIGH (ref 5–40)

## 2020-02-28 ENCOUNTER — Telehealth: Payer: Self-pay

## 2020-02-28 DIAGNOSIS — E782 Mixed hyperlipidemia: Secondary | ICD-10-CM

## 2020-02-28 MED ORDER — FENOFIBRATE 160 MG PO TABS: 160.0000 mg | ORAL_TABLET | Freq: Every day | ORAL | 3 refills | Status: DC

## 2020-02-28 NOTE — Telephone Encounter (Signed)
Reviewed results with patient's wife (DPR) who verbalized understanding.   Confirmed they do not have insurance.  Discussed with PharmD. Fenofibrate 160 mg called in to [facility] (should be about $18 with GoodRx). Coupon sent via text function to patient's wife. She will call and arrange repeat fasting blood work in about 4 months. She was grateful for call and agrees with treatment plan.

## 2020-02-28 NOTE — Telephone Encounter (Signed)
-----   Message from Olene Floss, RPH-CPP sent at 02/23/2020  8:16 AM EDT ----- I agree that even though labs were not fasting TG are most likely still quite elevated. Vascepa would be the best option, but I suspect the patient does not have insurance as I do not see any insurance in profile, but would need to confirm. Cash price w/ coupon for generic vascepa is still about $100/month. LDL is also probably falsely low due high TG. He would benefit from high intensity statin.  If he does not have insurance, the most affordable option for TG would be fenofibrate 145 ($9/30 days at [facility])

## 2020-03-22 ENCOUNTER — Other Ambulatory Visit: Payer: Self-pay | Admitting: Cardiovascular Disease

## 2020-11-18 ENCOUNTER — Encounter: Payer: Self-pay | Admitting: Emergency Medicine

## 2020-11-18 ENCOUNTER — Ambulatory Visit: Payer: Self-pay

## 2020-11-18 ENCOUNTER — Ambulatory Visit
Admission: EM | Admit: 2020-11-18 | Discharge: 2020-11-18 | Disposition: A | Discharge status: Home / Self Care | Payer: 59 | Attending: Internal Medicine | Admitting: Internal Medicine

## 2020-11-18 ENCOUNTER — Other Ambulatory Visit: Payer: Self-pay

## 2020-11-18 DIAGNOSIS — U071 COVID-19: Secondary | ICD-10-CM | POA: Insufficient documentation

## 2020-11-18 DIAGNOSIS — J069 Acute upper respiratory infection, unspecified: Secondary | ICD-10-CM | POA: Diagnosis not present

## 2020-11-18 DIAGNOSIS — R059 Cough, unspecified: Secondary | ICD-10-CM | POA: Insufficient documentation

## 2020-11-18 DIAGNOSIS — R509 Fever, unspecified: Secondary | ICD-10-CM | POA: Insufficient documentation

## 2020-11-18 DIAGNOSIS — R531 Weakness: Secondary | ICD-10-CM | POA: Insufficient documentation

## 2020-11-18 MED ORDER — BENZONATATE 200 MG PO CAPS: 200.0000 mg | ORAL_CAPSULE | Freq: Two times a day (BID) | ORAL | 0 refills | Status: DC | PRN

## 2020-11-18 MED ORDER — PREDNISONE 20 MG PO TABS: 20.0000 mg | ORAL_TABLET | Freq: Every day | ORAL | 0 refills | Status: DC

## 2020-11-18 NOTE — ED Provider Notes (Signed)
[facility]    CSN: [id number] Arrival date & time: 11/18/20  1318      History   Chief Complaint Chief Complaint  Patient presents with  . Fever  . Cough    HPI Terryon Pineiro is a 60 y.o. male who presents with onset of body aches and fever of 101.8 3 days ago. Cough onset since yesterday. His cough is productive. Denies SOB or wheezing.  Coughing hurts his throat and central chest. The cough is worse when he lays down.  Has had mild HA, and has been weak.  Has been taking OTC cough and cold med and has not helped him. His wife has been sick and she tested negative for Covid and Flu.    Past Medical History:  Diagnosis Date  . CAD (coronary artery disease)    a. 07/2012 Anterolateral MI/Cath: LAD 100 100%-> 3.0x16 Promus Element DES, otw nonobs dzs, EF 30%  . Cardiac arrest (HCC)   . Ischemic cardiomyopathy    a. 07/2012 Echo: EF 25-30% w/ mid ant, basal inf, sept Sev HK & apical, dist post, mid/dist inf, dist ant, dist lat AK. Gr 1 DD, mild MR.  . MI (myocardial infarction) (HCC)   . VF (ventricular fibrillation) (HCC)    a. in setting of MI 07/2012    Patient Active Problem List   Diagnosis Date Noted  . Hypertriglyceridemia 02/25/2015  . Pure hypercholesterolemia 09/14/2014  . Acute MI, anterolateral wall, initial episode of care (HCC) 07/12/2012  . Cardiomyopathy, ischemic 07/12/2012  . CAD (coronary artery disease) 07/12/2012  . Ventricular fibrillation (HCC) 07/08/2012    Past Surgical History:  Procedure Laterality Date  . APPENDECTOMY    . CARDIAC CATHETERIZATION    . LEFT HEART CATHETERIZATION WITH CORONARY ANGIOGRAM N/A 07/08/2012   Procedure: LEFT HEART CATHETERIZATION WITH CORONARY ANGIOGRAM;  Surgeon: [name], MD;  Location: [facility] CATH LAB;  Service: Cardiovascular;  Laterality: N/A;  . PERCUTANEOUS CORONARY STENT INTERVENTION (PCI-S)  07/08/2012   Procedure: PERCUTANEOUS CORONARY STENT INTERVENTION (PCI-S);  Surgeon: [name], MD;   Location: [facility] CATH LAB;  Service: Cardiovascular;;       Home Medications    Prior to Admission medications   Medication Sig Start Date End Date Taking? Authorizing Provider  aspirin EC 81 MG tablet Take 1 tablet (81 mg total) by mouth daily. 07/20/13  Yes [name], MD  carvedilol (COREG) 3.125 MG tablet Take 1 tablet by mouth twice daily 03/22/20  Yes [name], MD  nitroGLYCERIN (NITROSTAT) 0.4 MG SL tablet DISSOLVE ONE TABLET UNDER THE TONGUE EVERY 5 MINUTES AS NEEDED FOR CHEST PAIN.  DO NOT EXCEED A TOTAL OF 3 DOSES IN 15 MINUTES. Please keep upcoming appt. Thank you 02/15/20  Yes Excell Seltzer, Casimiro Needle, MD  omeprazole (PRILOSEC OTC) 20 MG tablet Take 20 mg by mouth daily.   Yes [provider]  acetaminophen (TYLENOL) 500 MG tablet Take 1,000 mg by mouth every 6 (six) hours as needed for mild pain.    [provider]  fenofibrate 160 MG tablet Take 1 tablet (160 mg total) by mouth daily. 02/28/20 02/22/21  [name], MD  pravastatin (PRAVACHOL) 40 MG tablet Take 1 tablet by mouth once daily 03/22/20   [name], MD  promethazine (PHENERGAN) 25 MG tablet Take 1 tablet (25 mg total) by mouth every 6 (six) hours as needed for nausea or vomiting. 01/24/18   Eber Hong, MD    Family History Family History  Problem Relation Age of Onset  .  Heart attack Father 8  . Heart attack Sister 14  . Heart attack Other 40    Social History Social History   Tobacco Use  . Smoking status: Never Smoker  . Smokeless tobacco: Never Used  Vaping Use  . Vaping Use: Never used  Substance Use Topics  . Alcohol use: No  . Drug use: No     Allergies   Patient has no known allergies.   Review of Systems Review of Systems  Constitutional: Positive for activity change, diaphoresis, fatigue and fever. Negative for appetite change.  HENT: Positive for rhinorrhea. Negative for congestion, ear discharge, ear pain, postnasal drip, sore throat and trouble swallowing.    Eyes: Negative for discharge.  Respiratory: Positive for cough. Negative for shortness of breath and wheezing.   Gastrointestinal: Negative for abdominal pain, diarrhea, nausea and vomiting.       Has had some soft stools   Musculoskeletal: Positive for myalgias. Negative for gait problem.  Skin: Negative for rash.  Neurological: Positive for headaches.  Hematological: Negative for adenopathy.     Physical Exam Triage Vital Signs ED Triage Vitals  Enc Vitals Group     BP 11/18/20 1413 123/73     Pulse Rate 11/18/20 1413 90     Resp 11/18/20 1413 18     Temp 11/18/20 1413 98.2 F (36.8 C)     Temp Source 11/18/20 1413 Oral     SpO2 11/18/20 1413 96 %     Weight 11/18/20 1411 207 lb 14.3 oz (94.3 kg)     Height 11/18/20 1411 5\' 8"  (1.727 m)     Head Circumference --      Peak Flow --      Pain Score 11/18/20 1411 6     Pain Loc --      Pain Edu? --      Excl. in GC? --    No data found.  Updated Vital Signs BP 123/73 (BP Location: Left Arm)   Pulse 90   Temp 98.2 F (36.8 C) (Oral)   Resp 18   Ht 5\' 8"  (1.727 m)   Wt 207 lb 14.3 oz (94.3 kg)   SpO2 96%   BMI 31.61 kg/m  I repeated his pulse ox at discharge and was 97% Visual Acuity Right Eye Distance:   Left Eye Distance:   Bilateral Distance:    Right Eye Near:   Left Eye Near:    Bilateral Near:     Physical Exam Physical Exam Vitals signs and nursing note reviewed.  Constitutional:      General: he is not in acute distress.    Appearance: Normal appearance. he is not ill-appearing, toxic-appearing or diaphoretic.  HENT:     Head: Normocephalic.     Right Ear: Tympanic membrane, ear canal and external ear normal.     Left Ear: Tympanic membrane, ear canal and external ear normal.     Nose: Nose normal.     Mouth/Throat: clear    Mouth: Mucous membranes are moist.  Eyes:     General: No scleral icterus.       Right eye: No discharge.        Left eye: No discharge.     Conjunctiva/sclera:  Conjunctivae normal.  Neck:     Musculoskeletal: Neck supple. No neck rigidity.  Cardiovascular:     Rate and Rhythm: Normal rate and regular rhythm.     Heart sounds: No murmur.  Pulmonary:     Effort:  Pulmonary effort is normal.     Breath sounds: Normal breath sounds.  Abdominal:     General: Bowel sounds are normal. There is no distension.     Palpations: Abdomen is soft. There is no mass.     Tenderness: There is no abdominal tenderness. There is no guarding or rebound.     Hernia: No hernia is present.  Musculoskeletal: Normal range of motion.  Lymphadenopathy:     Cervical: No cervical adenopathy.  Skin:    General: Skin is warm and dry.     Coloration: Skin is not jaundiced.     Findings: No rash.  Neurological:     Mental Status: he is alert and oriented to person, place, and time.     Gait: Gait normal.  Psychiatric:        Mood and Affect: Mood normal.        Behavior: Behavior normal.        Thought Content: Thought content normal.        Judgment: Judgment normal.   UC Treatments / Results  Labs (all labs ordered are listed, but only abnormal results are displayed) Labs Reviewed  SARS CORONAVIRUS 2 (TAT 6-24 HRS)    EKG   Radiology No results found.  Procedures Procedures (including critical care time)  Medications Ordered in UC Medications - No data to display  Initial Impression / Assessment and Plan / UC Course  I have reviewed the triage vital signs and the nursing notes. Has viral URI.  Covid test is pending. He will check mychart for results. I placed him on Prednisone for the chest soreness when he coughs, and Tessalon for the cough. See instructions.  Final Clinical Impressions(s) / UC Diagnoses   Final diagnoses:  None   Discharge Instructions   None    ED Prescriptions    None     PDMP not reviewed this encounter.   Garey Ham, PA-C 11/18/20 1443

## 2020-11-18 NOTE — Discharge Instructions (Addendum)
Stay quarantined until your test is back.  You may return to work on 1/20, but if your covid test is positive, then wear the mask for 5 more days at home.

## 2020-11-18 NOTE — ED Triage Notes (Signed)
Pt c/o cough, fever (101.8), and body aches. Started about 3 day ago.

## 2020-11-19 LAB — SARS CORONAVIRUS 2 (TAT 6-24 HRS): SARS Coronavirus 2: POSITIVE — AB

## 2020-11-22 DIAGNOSIS — E782 Mixed hyperlipidemia: Secondary | ICD-10-CM

## 2020-11-24 ENCOUNTER — Inpatient Hospital Stay (HOSPITAL_COMMUNITY)
Admission: EM | Admit: 2020-11-24 | Discharge: 2020-12-15 | DRG: 177 | Disposition: A | Discharge status: Home / Self Care | Payer: 59 | Attending: Family Medicine | Admitting: Family Medicine

## 2020-11-24 ENCOUNTER — Other Ambulatory Visit: Payer: Self-pay

## 2020-11-24 ENCOUNTER — Encounter (HOSPITAL_COMMUNITY): Payer: Self-pay | Admitting: Emergency Medicine

## 2020-11-24 ENCOUNTER — Emergency Department (HOSPITAL_COMMUNITY): Payer: 59

## 2020-11-24 DIAGNOSIS — A419 Sepsis, unspecified organism: Secondary | ICD-10-CM | POA: Diagnosis not present

## 2020-11-24 DIAGNOSIS — J069 Acute upper respiratory infection, unspecified: Secondary | ICD-10-CM | POA: Diagnosis present

## 2020-11-24 DIAGNOSIS — Z7982 Long term (current) use of aspirin: Secondary | ICD-10-CM | POA: Diagnosis not present

## 2020-11-24 DIAGNOSIS — E1165 Type 2 diabetes mellitus with hyperglycemia: Secondary | ICD-10-CM | POA: Diagnosis not present

## 2020-11-24 DIAGNOSIS — I2510 Atherosclerotic heart disease of native coronary artery without angina pectoris: Secondary | ICD-10-CM | POA: Diagnosis present

## 2020-11-24 DIAGNOSIS — I959 Hypotension, unspecified: Secondary | ICD-10-CM | POA: Diagnosis present

## 2020-11-24 DIAGNOSIS — R7989 Other specified abnormal findings of blood chemistry: Secondary | ICD-10-CM | POA: Diagnosis not present

## 2020-11-24 DIAGNOSIS — Z8249 Family history of ischemic heart disease and other diseases of the circulatory system: Secondary | ICD-10-CM | POA: Diagnosis not present

## 2020-11-24 DIAGNOSIS — E785 Hyperlipidemia, unspecified: Secondary | ICD-10-CM | POA: Diagnosis present

## 2020-11-24 DIAGNOSIS — K219 Gastro-esophageal reflux disease without esophagitis: Secondary | ICD-10-CM | POA: Diagnosis present

## 2020-11-24 DIAGNOSIS — Z79899 Other long term (current) drug therapy: Secondary | ICD-10-CM

## 2020-11-24 DIAGNOSIS — U071 COVID-19: Secondary | ICD-10-CM | POA: Diagnosis present

## 2020-11-24 DIAGNOSIS — J982 Interstitial emphysema: Secondary | ICD-10-CM | POA: Diagnosis not present

## 2020-11-24 DIAGNOSIS — Z8674 Personal history of sudden cardiac arrest: Secondary | ICD-10-CM

## 2020-11-24 DIAGNOSIS — T380X5A Adverse effect of glucocorticoids and synthetic analogues, initial encounter: Secondary | ICD-10-CM | POA: Diagnosis not present

## 2020-11-24 DIAGNOSIS — Z7952 Long term (current) use of systemic steroids: Secondary | ICD-10-CM | POA: Diagnosis not present

## 2020-11-24 DIAGNOSIS — J9621 Acute and chronic respiratory failure with hypoxia: Secondary | ICD-10-CM | POA: Diagnosis present

## 2020-11-24 DIAGNOSIS — I252 Old myocardial infarction: Secondary | ICD-10-CM

## 2020-11-24 DIAGNOSIS — E669 Obesity, unspecified: Secondary | ICD-10-CM | POA: Diagnosis present

## 2020-11-24 DIAGNOSIS — R0902 Hypoxemia: Secondary | ICD-10-CM

## 2020-11-24 DIAGNOSIS — I255 Ischemic cardiomyopathy: Secondary | ICD-10-CM | POA: Diagnosis present

## 2020-11-24 DIAGNOSIS — I25119 Atherosclerotic heart disease of native coronary artery with unspecified angina pectoris: Secondary | ICD-10-CM | POA: Diagnosis present

## 2020-11-24 DIAGNOSIS — J1282 Pneumonia due to coronavirus disease 2019: Secondary | ICD-10-CM | POA: Diagnosis present

## 2020-11-24 DIAGNOSIS — I10 Essential (primary) hypertension: Secondary | ICD-10-CM | POA: Diagnosis present

## 2020-11-24 DIAGNOSIS — R0600 Dyspnea, unspecified: Secondary | ICD-10-CM

## 2020-11-24 DIAGNOSIS — R5381 Other malaise: Secondary | ICD-10-CM | POA: Diagnosis present

## 2020-11-24 DIAGNOSIS — J9601 Acute respiratory failure with hypoxia: Secondary | ICD-10-CM | POA: Diagnosis present

## 2020-11-24 DIAGNOSIS — Z955 Presence of coronary angioplasty implant and graft: Secondary | ICD-10-CM | POA: Diagnosis not present

## 2020-11-24 DIAGNOSIS — E781 Pure hyperglyceridemia: Secondary | ICD-10-CM | POA: Diagnosis present

## 2020-11-24 DIAGNOSIS — Z6831 Body mass index (BMI) 31.0-31.9, adult: Secondary | ICD-10-CM | POA: Diagnosis not present

## 2020-11-24 DIAGNOSIS — E7800 Pure hypercholesterolemia, unspecified: Secondary | ICD-10-CM | POA: Diagnosis present

## 2020-11-24 DIAGNOSIS — R0602 Shortness of breath: Secondary | ICD-10-CM

## 2020-11-24 DIAGNOSIS — J189 Pneumonia, unspecified organism: Secondary | ICD-10-CM | POA: Diagnosis not present

## 2020-11-24 LAB — D-DIMER, QUANTITATIVE: D-Dimer, Quant: 0.57 ug/mL-FEU — ABNORMAL HIGH (ref 0.00–0.50)

## 2020-11-24 LAB — COMPREHENSIVE METABOLIC PANEL
ALT: 25 U/L (ref 0–44)
AST: 39 U/L (ref 15–41)
Albumin: 3.5 g/dL (ref 3.5–5.0)
Alkaline Phosphatase: 53 U/L (ref 38–126)
Anion gap: 12 (ref 5–15)
BUN: 22 mg/dL — ABNORMAL HIGH (ref 6–20)
CO2: 20 mmol/L — ABNORMAL LOW (ref 22–32)
Calcium: 8.6 mg/dL — ABNORMAL LOW (ref 8.9–10.3)
Chloride: 101 mmol/L (ref 98–111)
Creatinine, Ser: 1.06 mg/dL (ref 0.61–1.24)
GFR, Estimated: >60 mL/min (ref >60)
Glucose, Bld: 107 mg/dL — ABNORMAL HIGH (ref 70–99)
Potassium: 4.1 mmol/L (ref 3.5–5.1)
Sodium: 133 mmol/L — ABNORMAL LOW (ref 135–145)
Total Bilirubin: 0.5 mg/dL (ref 0.3–1.2)
Total Protein: 7.1 g/dL (ref 6.5–8.1)

## 2020-11-24 LAB — CBC WITH DIFFERENTIAL/PLATELET
Abs Immature Granulocytes: 0.03 10*3/uL (ref 0.00–0.07)
Basophils Absolute: 0 10*3/uL (ref 0.0–0.1)
Basophils Relative: 0 %
Eosinophils Absolute: 0 10*3/uL (ref 0.0–0.5)
Eosinophils Relative: 0 %
HCT: 45.4 % (ref 39.0–52.0)
Hemoglobin: 15.1 g/dL (ref 13.0–17.0)
Immature Granulocytes: 1 %
Lymphocytes Relative: 18 %
Lymphs Abs: 1.2 10*3/uL (ref 0.7–4.0)
MCH: 27 pg (ref 26.0–34.0)
MCHC: 33.3 g/dL (ref 30.0–36.0)
MCV: 81.1 fL (ref 80.0–100.0)
Monocytes Absolute: 0.5 10*3/uL (ref 0.1–1.0)
Monocytes Relative: 8 %
Neutro Abs: 4.7 10*3/uL (ref 1.7–7.7)
Neutrophils Relative %: 73 %
Platelets: 228 10*3/uL (ref 150–400)
RBC: 5.6 MIL/uL (ref 4.22–5.81)
RDW: 14.1 % (ref 11.5–15.5)
WBC: 6.5 10*3/uL (ref 4.0–10.5)
nRBC: 0 % (ref 0.0–0.2)

## 2020-11-24 LAB — EXPECTORATED SPUTUM ASSESSMENT W GRAM STAIN, RFLX TO RESP C: Special Requests: NORMAL

## 2020-11-24 LAB — TRIGLYCERIDES: Triglycerides: 196 mg/dL — ABNORMAL HIGH (ref <150)

## 2020-11-24 LAB — FIBRINOGEN: Fibrinogen: 553 mg/dL — ABNORMAL HIGH (ref 210–475)

## 2020-11-24 LAB — LACTIC ACID, PLASMA
Lactic Acid, Venous: 1.7 mmol/L (ref 0.5–1.9)
Lactic Acid, Venous: 1.1 mmol/L (ref 0.5–1.9)

## 2020-11-24 LAB — LACTATE DEHYDROGENASE: LDH: 223 U/L — ABNORMAL HIGH (ref 98–192)

## 2020-11-24 LAB — C-REACTIVE PROTEIN: CRP: 4.4 mg/dL — ABNORMAL HIGH (ref <1.0)

## 2020-11-24 LAB — PROCALCITONIN: Procalcitonin: <0.1 ng/mL

## 2020-11-24 LAB — FERRITIN: Ferritin: 416 ng/mL — ABNORMAL HIGH (ref 24–336)

## 2020-11-24 MED ORDER — SODIUM CHLORIDE 0.9 % IV SOLN
100.0000 mg | Freq: Every day | INTRAVENOUS | Status: AC
  Administered 2020-11-25 – 2020-11-28 (×4): 100 mg via INTRAVENOUS
  Filled 2020-11-24 (×4): qty 20

## 2020-11-24 MED ORDER — ACETAMINOPHEN 325 MG PO TABS
650.0000 mg | ORAL_TABLET | Freq: Once | ORAL | Status: AC
  Administered 2020-11-24: 650 mg via ORAL
  Filled 2020-11-24: qty 2

## 2020-11-24 MED ORDER — SODIUM CHLORIDE 0.9 % IV SOLN
200.0000 mg | Freq: Once | INTRAVENOUS | Status: AC
  Administered 2020-11-24: 200 mg via INTRAVENOUS
  Filled 2020-11-24: qty 200

## 2020-11-24 MED ORDER — DEXAMETHASONE SODIUM PHOSPHATE 10 MG/ML IJ SOLN
10.0000 mg | Freq: Once | INTRAMUSCULAR | Status: AC
  Administered 2020-11-24: 10 mg via INTRAVENOUS
  Filled 2020-11-24: qty 1

## 2020-11-24 NOTE — ED Notes (Signed)
ED TO INPATIENT HANDOFF REPORT  ED Nurse Name and Phone #: Sharene Skeans [phone]  S Name/Age/Gender Curtis Rosales 60 y.o. male Room/Bed: WA18/WA18  Code Status   Code Status: Prior  Home/SNF/Other Home Patient oriented to: self, place, time and situation Is this baseline? Yes   Triage Complete: Triage complete  Chief Complaint Acute respiratory disease due to COVID-19 virus [U07.1, J06.9]  Triage Note Patient reports testing positive for covid on 1/17. Reports weakness, cough and chills. Denies SOB or chest pain. Has been taking prednisone and tylenol at home. Hx of MI in 2013.     Allergies Allergies  Allergen Reactions  . Ibuprofen Other (See Comments)    Heart issue    Level of Care/Admitting Diagnosis ED Disposition    ED Disposition Condition Comment   Admit  Hospital Area: [facility] [[id number]]  Level of Care: Stepdown [14]  Admit to SDU based on following criteria: Hemodynamic compromise or significant risk of instability:  Patient requiring short term acute titration and management of vasoactive drips, and invasive monitoring (i.e., CVP and Arterial line).  May admit patient to [facility] or [facility] if equivalent level of care is available:: No  Covid Evaluation: Confirmed COVID Positive  Diagnosis: Acute respiratory disease due to COVID-19 virus [[id number]]  Admitting Physician: Therisa Doyne [[id number]]  Attending Physician: Therisa Doyne [[id number]]  Estimated length of stay: past midnight tomorrow  Certification:: I certify this patient will need inpatient services for at least 2 midnights       B Medical/Surgery History Past Medical History:  Diagnosis Date  . CAD (coronary artery disease)    a. 07/2012 Anterolateral MI/Cath: LAD 100 100%-> 3.0x16 Promus Element DES, otw nonobs dzs, EF 30%  . Cardiac arrest (HCC)   . Ischemic cardiomyopathy    a. 07/2012 Echo: EF 25-30% w/ mid ant, basal inf, sept Sev HK & apical, dist post,  mid/dist inf, dist ant, dist lat AK. Gr 1 DD, mild MR.  . MI (myocardial infarction) (HCC)   . VF (ventricular fibrillation) (HCC)    a. in setting of MI 07/2012   Past Surgical History:  Procedure Laterality Date  . APPENDECTOMY    . CARDIAC CATHETERIZATION    . LEFT HEART CATHETERIZATION WITH CORONARY ANGIOGRAM N/A 07/08/2012   Procedure: LEFT HEART CATHETERIZATION WITH CORONARY ANGIOGRAM;  Surgeon: [name], MD;  Location: [facility] CATH LAB;  Service: Cardiovascular;  Laterality: N/A;  . PERCUTANEOUS CORONARY STENT INTERVENTION (PCI-S)  07/08/2012   Procedure: PERCUTANEOUS CORONARY STENT INTERVENTION (PCI-S);  Surgeon: [name], MD;  Location: [facility] CATH LAB;  Service: Cardiovascular;;     A IV Location/Drains/Wounds Patient Lines/Drains/Airways Status    Active Line/Drains/Airways    Name Placement date Placement time Site Days   Peripheral IV 11/24/20 Right;Anterior Forearm 11/24/20  1917  Forearm  less than 1   Peripheral IV 11/24/20 Left Antecubital 11/24/20  1918  Antecubital  less than 1          Intake/Output Last 24 hours No intake or output data in the 24 hours ending 11/24/20 2256  Labs/Imaging Results for orders placed or performed during the hospital encounter of 11/24/20 (from the past 48 hour(s))  Lactic acid, plasma     Status: None   Collection Time: 11/24/20  7:06 PM  Result Value Ref Range   Lactic Acid, Venous 1.7 0.5 - 1.9 mmol/L    Comment: Performed at [facility], [address] [place], [place] [address]  CBC  WITH DIFFERENTIAL     Status: None   Collection Time: 11/24/20  7:07 PM  Result Value Ref Range   WBC 6.5 4.0 - 10.5 K/uL   RBC 5.60 4.22 - 5.81 MIL/uL   Hemoglobin 15.1 13.0 - 17.0 g/dL   HCT 16.145.4 09.639.0 - 04.552.0 %   MCV 81.1 80.0 - 100.0 fL   MCH 27.0 26.0 - 34.0 pg   MCHC 33.3 30.0 - 36.0 g/dL   RDW 40.914.1 81.111.5 - 91.415.5 %   Platelets 228 150 - 400 K/uL   nRBC 0.0 0.0 - 0.2 %   Neutrophils Relative % 73 %   Neutro Abs 4.7  1.7 - 7.7 K/uL   Lymphocytes Relative 18 %   Lymphs Abs 1.2 0.7 - 4.0 K/uL   Monocytes Relative 8 %   Monocytes Absolute 0.5 0.1 - 1.0 K/uL   Eosinophils Relative 0 %   Eosinophils Absolute 0.0 0.0 - 0.5 K/uL   Basophils Relative 0 %   Basophils Absolute 0.0 0.0 - 0.1 K/uL   Immature Granulocytes 1 %   Abs Immature Granulocytes 0.03 0.00 - 0.07 K/uL    Comment: Performed at [facility], [address] [place], [place] [address]  Comprehensive metabolic panel     Status: Abnormal   Collection Time: 11/24/20  7:07 PM  Result Value Ref Range   Sodium 133 (L) 135 - 145 mmol/L   Potassium 4.1 3.5 - 5.1 mmol/L   Chloride 101 98 - 111 mmol/L   CO2 20 (L) 22 - 32 mmol/L   Glucose, Bld 107 (H) 70 - 99 mg/dL    Comment: Glucose reference range applies only to samples taken after fasting for at least 8 hours.   BUN 22 (H) 6 - 20 mg/dL   Creatinine, Ser 6.211.06 0.61 - 1.24 mg/dL   Calcium 8.6 (L) 8.9 - 10.3 mg/dL   Total Protein 7.1 6.5 - 8.1 g/dL   Albumin 3.5 3.5 - 5.0 g/dL   AST 39 15 - 41 U/L   ALT 25 0 - 44 U/L   Alkaline Phosphatase 53 38 - 126 U/L   Total Bilirubin 0.5 0.3 - 1.2 mg/dL   GFR, Estimated >30>60 >86>60 mL/min    Comment: (NOTE) Calculated using the CKD-EPI Creatinine Equation (2021)    Anion gap 12 5 - 15    Comment: Performed at [facility], [address] [place], [place] [address]  D-dimer, quantitative     Status: Abnormal   Collection Time: 11/24/20  7:07 PM  Result Value Ref Range   D-Dimer, Quant 0.57 (H) 0.00 - 0.50 ug/mL-FEU    Comment: (NOTE) At the manufacturer cut-off value of 0.5 g/mL FEU, this assay has a negative predictive value of 95-100%.This assay is intended for use in conjunction with a clinical pretest probability (PTP) assessment model to exclude pulmonary embolism (PE) and deep venous thrombosis (DVT) in outpatients suspected of PE or DVT. Results should be correlated with clinical presentation. Performed  at [facility], [address] [place], [place] [address]   Procalcitonin     Status: None   Collection Time: 11/24/20  7:07 PM  Result Value Ref Range   Procalcitonin <0.10 ng/mL    Comment:        Interpretation: PCT (Procalcitonin) <= 0.5 ng/mL: Systemic infection (sepsis) is not likely. Local bacterial infection is possible. (NOTE)       Sepsis PCT Algorithm  Lower Respiratory Tract                                      Infection PCT Algorithm    ----------------------------     ----------------------------         PCT < 0.25 ng/mL                PCT < 0.10 ng/mL          Strongly encourage             Strongly discourage   discontinuation of antibiotics    initiation of antibiotics    ----------------------------     -----------------------------       PCT 0.25 - 0.50 ng/mL            PCT 0.10 - 0.25 ng/mL               OR       >80% decrease in PCT            Discourage initiation of                                            antibiotics      Encourage discontinuation           of antibiotics    ----------------------------     -----------------------------         PCT >= 0.50 ng/mL              PCT 0.26 - 0.50 ng/mL               AND        <80% decrease in PCT             Encourage initiation of                                             antibiotics       Encourage continuation           of antibiotics    ----------------------------     -----------------------------        PCT >= 0.50 ng/mL                  PCT > 0.50 ng/mL               AND         increase in PCT                  Strongly encourage                                      initiation of antibiotics    Strongly encourage escalation           of antibiotics                                     -----------------------------  PCT <= 0.25 ng/mL                                                 OR                                        >  80% decrease in PCT                                      Discontinue / Do not initiate                                             antibiotics  Performed at [facility], [address] [place], [place] [address]   Lactate dehydrogenase     Status: Abnormal   Collection Time: 11/24/20  7:07 PM  Result Value Ref Range   LDH 223 (H) 98 - 192 U/L    Comment: Performed at [facility], [address], [place], [place] [address]  Ferritin     Status: Abnormal   Collection Time: 11/24/20  7:07 PM  Result Value Ref Range   Ferritin 416 (H) 24 - 336 ng/mL    Comment: Performed at [facility], [address] [place], [place] [address]  Fibrinogen     Status: Abnormal   Collection Time: 11/24/20  7:07 PM  Result Value Ref Range   Fibrinogen 553 (H) 210 - 475 mg/dL    Comment: Performed at [facility], [address], [place], [place] [address]  C-reactive protein     Status: Abnormal   Collection Time: 11/24/20  7:07 PM  Result Value Ref Range   CRP 4.4 (H) <1.0 mg/dL    Comment: Performed at [facility], [address], [place], [place] [address]  Triglycerides     Status: Abnormal   Collection Time: 11/24/20  7:17 PM  Result Value Ref Range   Triglycerides 196 (H) <150 mg/dL    Comment: Performed at [facility], [address], [place], [place] [address]  Lactic acid, plasma     Status: None   Collection Time: 11/24/20  8:10 PM  Result Value Ref Range   Lactic Acid, Venous 1.1 0.5 - 1.9 mmol/L    Comment: Performed at [facility], [address], [place], [place] [address]  Expectorated sputum assessment w rflx to resp cult     Status: None   Collection Time: 11/24/20  9:28 PM   Specimen: Sputum  Result Value Ref Range   Specimen Description SPUTUM    Special Requests Normal    Sputum evaluation      THIS SPECIMEN IS ACCEPTABLE FOR SPUTUM  CULTURE Performed at [facility], [address], [place], [place] [address]    Report Status 11/24/2020 FINAL    DG Chest Port 1 View  Result Date: 11/24/2020 CLINICAL DATA:  COVID-19 positive 11/08/2020, weakness, cough, chills EXAM: PORTABLE CHEST 1 VIEW COMPARISON:  07/08/2012 FINDINGS: The heart size and mediastinal contours  are within normal limits. Both lungs are clear. The visualized skeletal structures are unremarkable. IMPRESSION: No active disease. Electronically Signed   By: Sharlet Salina M.D.   On: 11/24/2020 18:45    Pending Labs Unresulted Labs (From admission, onward)          Start     Ordered   11/24/20 2128  Culture, respiratory  Once,   R        11/24/20 2128   11/24/20 2123  CK  Add-on,   AD        11/24/20 2122   11/24/20 2123  Magnesium  Add-on,   AD        11/24/20 2122   11/24/20 2034  HIV Antibody (routine testing w rflx)  (HIV Antibody (Routine testing w reflex) panel)  Once,   STAT        11/24/20 2033   11/24/20 1810  Blood Culture (routine x 2)  BLOOD CULTURE X 2,   STAT      11/24/20 1810   Signed and Held  ABO/Rh  Once,   R        Signed and Held   Signed and Held  CBC with Differential/Platelet  Daily,   R      Signed and Held   Signed and Held  Comprehensive metabolic panel  Daily,   R      Signed and Held   Signed and Held  C-reactive protein  Daily,   R      Signed and Held   Signed and Held  D-dimer, quantitative (not at [facility])  Daily,   R      Signed and Held   Signed and Held  Ferritin  Daily,   R      Signed and Held   Signed and Held  Magnesium  Daily,   R      Signed and Held   Signed and Held  Phosphorus  Daily,   R      Signed and Held          Vitals/Pain Today's Vitals   11/24/20 1920 11/24/20 1922 11/24/20 1944 11/24/20 1958  BP:  (!) 74/53 (!) 86/53 (!) 107/59  Pulse:  92 96 89  Resp:  (!) 25 (!) 28 (!) 33  Temp: (!) 101.3 F (38.5 C)     TempSrc: Oral     SpO2:  90% 90% 94%  PainSc:         Isolation Precautions Airborne and Contact precautions  Medications Medications  remdesivir 200 mg in sodium chloride 0.9% 250 mL IVPB (200 mg Intravenous New Bag/Given 11/24/20 2205)    Followed by  remdesivir 100 mg in sodium chloride 0.9 % 100 mL IVPB (has no administration in time range)  acetaminophen (TYLENOL) tablet 650 mg (650 mg Oral Given 11/24/20 1825)  dexamethasone (DECADRON) injection 10 mg (10 mg Intravenous Given 11/24/20 2103)    Mobility walks with person assist Low fall risk   Focused Assessments Pulmonary Assessment Handoff:  Lung sounds:   O2 Device: Nasal Cannula O2 Flow Rate (L/min): 2 L/min      R Recommendations: See Admitting Provider Note  Report given to:   Additional Notes:

## 2020-11-24 NOTE — ED Provider Notes (Signed)
[facility]-EMERGENCY DEPT Provider Note   CSN: [id number] Arrival date & time: 11/24/20  1550     History Chief Complaint  Patient presents with  . Covid Positive    Curtis Rosales is a 59 y.o. male with PMH significant for CAD with MI on aspirin 81 mg, HTN, and HLD who presents to the ED with complaints of generalized malaise, cough, and chills in the context of COVID-19.  Patient reportedly tested positive on 11/18/2020.    On my examination, patient states that he became symptomatic of COVID-19 on 11/16/2020 and tested positive on 11/18/2020 in urgent care in [place], [place].  He is unvaccinated.  He denies any history of tobacco use disorder or COPD.  He was ill-appearing on my exam and I obtained repeat temperature and he was febrile at 102.6 F.  He is tachycardic in the room, but states that he has not been experiencing any difficulty eating or drinking.  He simply feels very weak at home and worsening dyspnea particularly with exertion.  He states that he has been getting worse, not better, which prompted him to come to the ED for evaluation.  He endorses some chest and epigastric discomfort, but only with coughing.  He states that he has been taking Tylenol in addition to his prescribed Tessalon Perles and prednisone burst, without much relief.  He denies any history of clots or clotting disorder, unilateral extremity swelling or edema, hemoptysis, exertional chest pain, pleuritic symptoms, or inability to tolerate food/liquid by mouth.  HPI     Past Medical History:  Diagnosis Date  . CAD (coronary artery disease)    a. 07/2012 Anterolateral MI/Cath: LAD 100 100%-> 3.0x16 Promus Element DES, otw nonobs dzs, EF 30%  . Cardiac arrest (HCC)   . Ischemic cardiomyopathy    a. 07/2012 Echo: EF 25-30% w/ mid ant, basal inf, sept Sev HK & apical, dist post, mid/dist inf, dist ant, dist lat AK. Gr 1 DD, mild MR.  . MI (myocardial infarction) (HCC)   . VF (ventricular fibrillation)  (HCC)    a. in setting of MI 07/2012    Patient Active Problem List   Diagnosis Date Noted  . Hypertriglyceridemia 02/25/2015  . Pure hypercholesterolemia 09/14/2014  . Acute MI, anterolateral wall, initial episode of care (HCC) 07/12/2012  . Cardiomyopathy, ischemic 07/12/2012  . CAD (coronary artery disease) 07/12/2012  . Ventricular fibrillation (HCC) 07/08/2012    Past Surgical History:  Procedure Laterality Date  . APPENDECTOMY    . CARDIAC CATHETERIZATION    . LEFT HEART CATHETERIZATION WITH CORONARY ANGIOGRAM N/A 07/08/2012   Procedure: LEFT HEART CATHETERIZATION WITH CORONARY ANGIOGRAM;  Surgeon: [name], MD;  Location: [facility] CATH LAB;  Service: Cardiovascular;  Laterality: N/A;  . PERCUTANEOUS CORONARY STENT INTERVENTION (PCI-S)  07/08/2012   Procedure: PERCUTANEOUS CORONARY STENT INTERVENTION (PCI-S);  Surgeon: [name], MD;  Location: [facility] CATH LAB;  Service: Cardiovascular;;       Family History  Problem Relation Age of Onset  . Heart attack Father 39  . Heart attack Sister 58  . Heart attack Other 40    Social History   Tobacco Use  . Smoking status: Never Smoker  . Smokeless tobacco: Never Used  Vaping Use  . Vaping Use: Never used  Substance Use Topics  . Alcohol use: No  . Drug use: No    Home Medications Prior to Admission medications   Medication Sig Start Date End Date Taking? Authorizing Provider  acetaminophen (TYLENOL) 500 MG  tablet Take 1,000 mg by mouth every 6 (six) hours as needed for mild pain.    [provider]  aspirin EC 81 MG tablet Take 1 tablet (81 mg total) by mouth daily. 07/20/13   [name], MD  benzonatate (TESSALON) 200 MG capsule Take 1 capsule (200 mg total) by mouth 2 (two) times daily as needed for cough. 11/18/20   Rodriguez-Southworth, Nettie Elm, PA-C  carvedilol (COREG) 3.125 MG tablet Take 1 tablet by mouth twice daily 03/22/20   [name], MD  nitroGLYCERIN (NITROSTAT) 0.4 MG SL tablet DISSOLVE ONE  TABLET UNDER THE TONGUE EVERY 5 MINUTES AS NEEDED FOR CHEST PAIN.  DO NOT EXCEED A TOTAL OF 3 DOSES IN 15 MINUTES. Please keep upcoming appt. Thank you 02/15/20   [name], MD  omeprazole (PRILOSEC OTC) 20 MG tablet Take 20 mg by mouth daily.    [provider]  pravastatin (PRAVACHOL) 40 MG tablet Take 1 tablet by mouth once daily 03/22/20   [name], MD  predniSONE (DELTASONE) 20 MG tablet Take 1 tablet (20 mg total) by mouth daily with breakfast. To help painful cough 11/18/20   Rodriguez-Southworth, Nettie Elm, PA-C  promethazine (PHENERGAN) 25 MG tablet Take 1 tablet (25 mg total) by mouth every 6 (six) hours as needed for nausea or vomiting. 01/24/18   Eber Hong, MD  fenofibrate 160 MG tablet Take 1 tablet (160 mg total) by mouth daily. 02/28/20 11/18/20  [name], MD    Allergies    Patient has no known allergies.  Review of Systems   Review of Systems  All other systems reviewed and are negative.   Physical Exam Updated Vital Signs BP (!) 107/59   Pulse 89   Temp (!) 101.3 F (38.5 C) (Oral)   Resp (!) 33   SpO2 94%   Physical Exam Vitals and nursing note reviewed. Exam conducted with a chaperone present.  Constitutional:      Appearance: He is ill-appearing.  HENT:     Head: Normocephalic and atraumatic.  Eyes:     General: No scleral icterus.    Conjunctiva/sclera: Conjunctivae normal.  Cardiovascular:     Rate and Rhythm: Regular rhythm. Tachycardia present.     Pulses: Normal pulses.  Pulmonary:     Effort: No respiratory distress.     Breath sounds: Rales present.     Comments: No significant increased work of breathing.  Mildly tachypneic.  Oxygen saturation 87 to 89% on RA during my examination. Musculoskeletal:     Right lower leg: No edema.     Left lower leg: No edema.  Skin:    General: Skin is dry.     Capillary Refill: Capillary refill takes less than 2 seconds.  Neurological:     Mental Status: He is alert and oriented to  person, place, and time.     GCS: GCS eye subscore is 4. GCS verbal subscore is 5. GCS motor subscore is 6.  Psychiatric:        Mood and Affect: Mood normal.        Behavior: Behavior normal.        Thought Content: Thought content normal.     ED Results / Procedures / Treatments   Labs (all labs ordered are listed, but only abnormal results are displayed) Labs Reviewed  COMPREHENSIVE METABOLIC PANEL - Abnormal; Notable for the following components:      Result Value   Sodium 133 (*)    CO2 20 (*)  Glucose, Bld 107 (*)    BUN 22 (*)    Calcium 8.6 (*)    All other components within normal limits  D-DIMER, QUANTITATIVE (NOT AT [facility]) - Abnormal; Notable for the following components:   D-Dimer, Quant 0.57 (*)    All other components within normal limits  LACTATE DEHYDROGENASE - Abnormal; Notable for the following components:   LDH 223 (*)    All other components within normal limits  FERRITIN - Abnormal; Notable for the following components:   Ferritin 416 (*)    All other components within normal limits  TRIGLYCERIDES - Abnormal; Notable for the following components:   Triglycerides 196 (*)    All other components within normal limits  FIBRINOGEN - Abnormal; Notable for the following components:   Fibrinogen 553 (*)    All other components within normal limits  C-REACTIVE PROTEIN - Abnormal; Notable for the following components:   CRP 4.4 (*)    All other components within normal limits  CULTURE, BLOOD (ROUTINE X 2)  CULTURE, BLOOD (ROUTINE X 2)  LACTIC ACID, PLASMA  CBC WITH DIFFERENTIAL/PLATELET  PROCALCITONIN  LACTIC ACID, PLASMA  HIV ANTIBODY (ROUTINE TESTING W REFLEX)    EKG EKG Interpretation  Date/Time:  Sunday November 24 2020 18:49:15 EST Ventricular Rate:  103 PR Interval:    QRS Duration: 99 QT Interval:  339 QTC Calculation: 444 R Axis:   40 Text Interpretation: Sinus tachycardia 12 Lead; Mason-Likar Confirmed by Lorre NickAllen, Anthony ([id number]) on  11/24/2020 8:12:50 PM   Radiology DG Chest Port 1 View  Result Date: 11/24/2020 CLINICAL DATA:  COVID-19 positive 11/08/2020, weakness, cough, chills EXAM: PORTABLE CHEST 1 VIEW COMPARISON:  07/08/2012 FINDINGS: The heart size and mediastinal contours are within normal limits. Both lungs are clear. The visualized skeletal structures are unremarkable. IMPRESSION: No active disease. Electronically Signed   By: Sharlet SalinaMichael  Brown M.D.   On: 11/24/2020 18:45    Procedures .Critical Care Performed by: Lorelee NewGreen, Eular Panek L, PA-C Authorized by: Lorelee NewGreen, Kassy Mcenroe L, PA-C   Critical care provider statement:    Critical care time (minutes):  45   Critical care was necessary to treat or prevent imminent or life-threatening deterioration of the following conditions:  Respiratory failure   Critical care was time spent personally by me on the following activities:  Discussions with consultants, evaluation of patient's response to treatment, examination of patient, ordering and performing treatments and interventions, ordering and review of laboratory studies, ordering and review of radiographic studies, pulse oximetry, re-evaluation of patient's condition, obtaining history from patient or surrogate, review of old charts and blood draw for specimens Comments:     COVID-19 hypoxia   (including critical care time)  Medications Ordered in ED Medications  dexamethasone (DECADRON) injection 10 mg (has no administration in time range)  remdesivir 200 mg in sodium chloride 0.9% 250 mL IVPB (has no administration in time range)    Followed by  remdesivir 100 mg in sodium chloride 0.9 % 100 mL IVPB (has no administration in time range)  acetaminophen (TYLENOL) tablet 650 mg (650 mg Oral Given 11/24/20 1825)    ED Course  I have reviewed the triage vital signs and the nursing notes.  Pertinent labs & imaging results that were available during my care of the patient were reviewed by me and considered in my medical  decision making (see chart for details).  Clinical Course as of 11/24/20 2055  Sun Nov 24, 2020  2055 I spoke with Dr. Adela Glimpseoutova who will see  and admit patient. [GG]    Clinical Course User Index [GG] Lorelee New, PA-C   MDM Rules/Calculators/A&P                          Curtis Rosales was evaluated in Emergency Department on 11/24/2020 for the symptoms described in the history of present illness. He was evaluated in the context of the global COVID-19 pandemic, which necessitated consideration that the patient might be at risk for infection with the SARS-CoV-2 virus that causes COVID-19. Institutional protocols and algorithms that pertain to the evaluation of patients at risk for COVID-19 are in a state of rapid change based on information released by regulatory bodies including the CDC and federal and state organizations. These policies and algorithms were followed during the patient's care in the ED.  I personally reviewed patient's medical chart and all notes from triage and staff during today's encounter. I have also ordered and reviewed all labs and imaging that I felt to be medically necessary in the evaluation of this patient's complaints and with consideration of their with their physical exam. If needed, translation services were available and utilized.  Patient's recent positive COVID-19 test obtained 11/19/19 can be seen in chart.  Patient was hypoxic between 87 to 89% on my examination.  He was also febrile to 102.6 F and tachycardic to 115.  He states that he has been feeling progressively weaker and dyspneic with exertion.  RN tells me that he was also hypoxic with ambulation to the room before improving after rest.  Considered discharge home with outpatient referral for COVID-19 treatment, however given his hypoxia here in the ED feel as though he would benefit from admission for supplemental oxygen, antiviral medications, steroids, and continued observation.  I was notified by RN that  he became hypotensive to 70/50.  Suspect that this may be partly related to vasovagal.  On my subsequent evaluation, his blood pressure had spontaneously improved to 107/59.  However, he continues to benefit from 2 L supplemental O2 in the low 90s with tachypnea in 30s.  Will provide patient Decadron and start on remdesivir.  Plan for admission.  I spoke with Dr. Adela Glimpse who will see and admit patient.   Final Clinical Impression(s) / ED Diagnoses Final diagnoses:  COVID-19  Hypoxia    Rx / DC Orders ED Discharge Orders    None       Elvera Maria 11/24/20 2056    Lorre Nick, MD 11/24/20 2238

## 2020-11-24 NOTE — H&P (Signed)
Curtis Rosales:[id number] DOB: 27-Mar-1961 DOA: 11/24/2020    PCP: Ambrose Mantle, MD   Outpatient Specialists:   CARDS:   Dr. Excell Seltzer    Patient arrived to ER on 11/24/20 at 1550 Referred by Attending Lorre Nick, MD  Patient coming from: home Lives With family   Chief Complaint:  Chief Complaint  Patient presents with  . Covid Positive    HPI: Curtis Rosales is a 60 y.o. male with medical history significant of  CAD, STEMI 2013, V fib arrest    Presented with   progressive worsening symptoms of COVID Patient has had cough body aches fever starting on 14 January associated with sore throat headache and generalized fatigue. His wife has been sick prior to this and was tested for COVID and flu and was negative Patient eventually tested positive for COVID 17th January They message his cardiologist who recommended follow-up with primary care provider To see if infusion can be arranged for the patient He had steroids at home that was prescribed to him but did not seem to help. He reports decreased p.o. intake   Patient is unvaccinated  Infectious risk factors:  Reports fever, shortness of breath, dry cough, chest pain, Sore throat, URI symptoms, anosmia/change in taste   Body aches, severe fatigue     KNOWN COVID POSITIVE   Has  NOt been vaccinated against COVID    Initial COVID TEST   POSITIVE,     Lab Results  Component Value Date   SARSCOV2NAA POSITIVE (A) 11/18/2020     Regarding pertinent Chronic problems:   Hyperlipidemia -   on statins Pravachol and fenofibrate Lipid Panel     Component Value Date/Time   CHOL 159 02/19/2020 1449   TRIG 196 (H) 11/24/2020 1917   HDL 27 (L) 02/19/2020 1449   CHOLHDL 5.9 (H) 02/19/2020 1449   CHOLHDL 4.9 11/15/2015 0933   VLDL 44 (H) 11/15/2015 0933   LDLCALC 58 02/19/2020 1449   LDLDIRECT 81.1 09/21/2014 0834   LABVLDL 74 (H) 02/19/2020 1449    HTN on Coreg     CAD  - On Aspirin, statin, betablocker,                 -  followed by cardiology                 Last stent was in 2013 History of STEMI and V. fib outside hospital arrest   obesity-   BMI Readings from Last 1 Encounters:  11/18/20 31.61 kg/m   While in ER: Noted to be hypoxic down to mid 80s with exertion started on 2 L Febrile up to 101.3    Hospitalist was called for admission for acute respiratory failure with hypoxia secondary to COVID infection  The following Work up has been ordered so far:  Orders Placed This Encounter  Procedures  . Critical Care  . Blood Culture (routine x 2)  . DG Chest Port 1 View  . Lactic acid, plasma  . CBC WITH DIFFERENTIAL  . Comprehensive metabolic panel  . D-dimer, quantitative  . Procalcitonin  . Lactate dehydrogenase  . Ferritin  . Triglycerides  . Fibrinogen  . C-reactive protein  . HIV Antibody (routine testing w rflx)  . Diet NPO time specified  . Cardiac monitoring  . Insert peripheral IV x 2  . Initiate Carrier Fluid Protocol  . Place surgical mask on patient  . Patient to wear surgical mask during transportation  . Assess patient for  ability to self-prone. If able (can move self in bed, ambulate) and stable (SpO2 and oxygen requirement):  . RN/NT - Document specific oxygen requirements in CHL  . Notify EDP if new oxygen requirements escalates > 4L per minute [place]  . RN to draw the following extra tubes:  . Consult to hospitalist  ALL PATIENTS BEING ADMITTED/HAVING PROCEDURES NEED COVID-19 SCREENING  . Airborne and Contact precautions  . Pulse oximetry, continuous  . ED EKG 12-Lead     Following Medications were ordered in ER: Medications  remdesivir 200 mg in sodium chloride 0.9% 250 mL IVPB (has no administration in time range)    Followed by  remdesivir 100 mg in sodium chloride 0.9 % 100 mL IVPB (has no administration in time range)  acetaminophen (TYLENOL) tablet 650 mg (650 mg Oral Given 11/24/20 1825)  dexamethasone (DECADRON) injection 10 mg (10 mg Intravenous Given  11/24/20 2103)        Consult Orders  (From admission, onward)         Start     Ordered   11/24/20 2035  Consult to hospitalist  ALL PATIENTS BEING ADMITTED/HAVING PROCEDURES NEED COVID-19 SCREENING  Once       Comments: ALL PATIENTS BEING ADMITTED/HAVING PROCEDURES NEED COVID-19 SCREENING  Provider:  (Not yet assigned)  Question Answer Comment  Place call to: [facility]   Reason for Consult Admit      11/24/20 2034          Significant initial  Findings: Abnormal Labs Reviewed  COMPREHENSIVE METABOLIC PANEL - Abnormal; Notable for the following components:      Result Value   Sodium 133 (*)    CO2 20 (*)    Glucose, Bld 107 (*)    BUN 22 (*)    Calcium 8.6 (*)    All other components within normal limits  D-DIMER, QUANTITATIVE (NOT AT [facility]) - Abnormal; Notable for the following components:   D-Dimer, Quant 0.57 (*)    All other components within normal limits  LACTATE DEHYDROGENASE - Abnormal; Notable for the following components:   LDH 223 (*)    All other components within normal limits  FERRITIN - Abnormal; Notable for the following components:   Ferritin 416 (*)    All other components within normal limits  TRIGLYCERIDES - Abnormal; Notable for the following components:   Triglycerides 196 (*)    All other components within normal limits  FIBRINOGEN - Abnormal; Notable for the following components:   Fibrinogen 553 (*)    All other components within normal limits  C-REACTIVE PROTEIN - Abnormal; Notable for the following components:   CRP 4.4 (*)    All other components within normal limits    Otherwise labs showing:    Recent Labs  Lab 11/24/20 1907  NA 133*  K 4.1  CO2 20*  GLUCOSE 107*  BUN 22*  CREATININE 1.06  CALCIUM 8.6*     Cr   stable,    Lab Results  Component Value Date   CREATININE 1.06 11/24/2020   CREATININE 1.03 02/19/2020   CREATININE 0.90 07/29/2018    Recent Labs  Lab 11/24/20 1907  AST 39  ALT 25  ALKPHOS 53   BILITOT 0.5  PROT 7.1  ALBUMIN 3.5   Lab Results  Component Value Date   CALCIUM 8.6 (L) 11/24/2020     WBC       Component Value Date/Time   WBC 6.5 11/24/2020 1907   LYMPHSABS 1.2 11/24/2020 1907  LYMPHSABS 3.0 02/19/2020 1449   MONOABS 0.5 11/24/2020 1907   EOSABS 0.0 11/24/2020 1907   EOSABS 0.3 02/19/2020 1449   BASOSABS 0.0 11/24/2020 1907   BASOSABS 0.1 02/19/2020 1449    Plt: Lab Results  Component Value Date   PLT 228 11/24/2020     Lactic Acid, Venous    Component Value Date/Time   LATICACIDVEN 1.7 11/24/2020 1906     Procalcitonin <0.1   COVID-19 Labs  Recent Labs    11/24/20 1907  DDIMER 0.57*  FERRITIN 416*  LDH 223*  CRP 4.4*    Lab Results  Component Value Date   SARSCOV2NAA POSITIVE (A) 11/18/2020    HG/HCT stable,      Component Value Date/Time   HGB 15.1 11/24/2020 1907   HGB 15.2 02/19/2020 1449   HCT 45.4 11/24/2020 1907   HCT 45.7 02/19/2020 1449   MCV 81.1 11/24/2020 1907   MCV 83 02/19/2020 1449     ECG: Ordered Personally reviewed by me showing: HR : 103 Rhythm:  Sinus tachycardia     no evidence of ischemic changes QTC 444        CXR -   NON acute   ED Triage Vitals  Enc Vitals Group     BP 11/24/20 1555 137/86     Pulse Rate 11/24/20 1555 (!) 102     Resp 11/24/20 1555 20     Temp 11/24/20 1555 99.5 F (37.5 C)     Temp Source 11/24/20 1555 Oral     SpO2 11/24/20 1555 93 %     Weight --      Height --      Head Circumference --      Peak Flow --      Pain Score 11/24/20 1616 0     Pain Loc --      Pain Edu? --      Excl. in GC? --   TMAX(24)@       Latest  Blood pressure (!) 107/59, pulse 89, temperature (!) 101.3 F (38.5 C), temperature source Oral, resp. rate (!) 33, SpO2 94 %.    Review of Systems:    Pertinent positives include: Fevers, chills, fatigue  shortness of breath at rest.   dyspnea on exertion,  Constitutional:  No weight loss, night sweats,  weight loss  HEENT:  No  headaches, Difficulty swallowing,Tooth/dental problems,Sore throat,  No sneezing, itching, ear ache, nasal congestion, post nasal drip,  Cardio-vascular:  No chest pain, Orthopnea, PND, anasarca, dizziness, palpitations.no Bilateral lower extremity swelling  GI:  No heartburn, indigestion, abdominal pain, nausea, vomiting, diarrhea, change in bowel habits, loss of appetite, melena, blood in stool, hematemesis Resp:  no No excess mucus, no productive cough, No non-productive cough, No coughing up of blood.No change in color of mucus.No wheezing. Skin:  no rash or lesions. No jaundice GU:  no dysuria, change in color of urine, no urgency or frequency. No straining to urinate.  No flank pain.  Musculoskeletal:  No joint pain or no joint swelling. No decreased range of motion. No back pain.  Psych:  No change in mood or affect. No depression or anxiety. No memory loss.  Neuro: no localizing neurological complaints, no tingling, no weakness, no double vision, no gait abnormality, no slurred speech, no confusion  All systems reviewed and apart from HOPI all are negative  Past Medical History:   Past Medical History:  Diagnosis Date  . CAD (coronary artery disease)    a.  07/2012 Anterolateral MI/Cath: LAD 100 100%-> 3.0x16 Promus Element DES, otw nonobs dzs, EF 30%  . Cardiac arrest (HCC)   . Ischemic cardiomyopathy    a. 07/2012 Echo: EF 25-30% w/ mid ant, basal inf, sept Sev HK & apical, dist post, mid/dist inf, dist ant, dist lat AK. Gr 1 DD, mild MR.  . MI (myocardial infarction) (HCC)   . VF (ventricular fibrillation) (HCC)    a. in setting of MI 07/2012     Past Surgical History:  Procedure Laterality Date  . APPENDECTOMY    . CARDIAC CATHETERIZATION    . LEFT HEART CATHETERIZATION WITH CORONARY ANGIOGRAM N/A 07/08/2012   Procedure: LEFT HEART CATHETERIZATION WITH CORONARY ANGIOGRAM;  Surgeon: Tonny BollmanMichael Cooper, MD;  [facility];  Service: Cardiovascular;  Laterality: N/A;   . PERCUTANEOUS CORONARY STENT INTERVENTION (PCI-S)  07/08/2012   Procedure: PERCUTANEOUS CORONARY STENT INTERVENTION (PCI-S);  Surgeon: Tonny BollmanMichael Cooper, MD;  [facility];  Service: Cardiovascular;;    Social History:  Ambulatory   independently       reports that he has never smoked. He has never used smokeless tobacco. He reports that he does not drink alcohol and does not use drugs.   Family History:   Family History  Problem Relation Age of Onset  . Heart attack Father 655  . Heart attack Sister [phone]  . Heart attack Other 40    Allergies: No Known Allergies   Prior to Admission medications   Medication Sig Start Date End Date Taking? Authorizing Provider  acetaminophen (TYLENOL) 500 MG tablet Take 1,000 mg by mouth every 6 (six) hours as needed for mild pain.    [provider]  aspirin EC 81 MG tablet Take 1 tablet (81 mg total) by mouth daily. 07/20/13   Tonny Bollmanooper, Michael, MD  benzonatate (TESSALON) 200 MG capsule Take 1 capsule (200 mg total) by mouth 2 (two) times daily as needed for cough. 11/18/20   Rodriguez-Southworth, Nettie ElmSylvia, PA-C  carvedilol (COREG) 3.125 MG tablet Take 1 tablet by mouth twice daily 03/22/20   Tonny Bollmanooper, Michael, MD  nitroGLYCERIN (NITROSTAT) 0.4 MG SL tablet DISSOLVE ONE TABLET UNDER THE TONGUE EVERY 5 MINUTES AS NEEDED FOR CHEST PAIN.  DO NOT EXCEED A TOTAL OF 3 DOSES IN 15 MINUTES. Please keep upcoming appt. Thank you 02/15/20   Tonny Bollmanooper, Michael, MD  omeprazole (PRILOSEC OTC) 20 MG tablet Take 20 mg by mouth daily.    [provider]  pravastatin (PRAVACHOL) 40 MG tablet Take 1 tablet by mouth once daily 03/22/20   Tonny Bollmanooper, Michael, MD  predniSONE (DELTASONE) 20 MG tablet Take 1 tablet (20 mg total) by mouth daily with breakfast. To help painful cough 11/18/20   Rodriguez-Southworth, Nettie ElmSylvia, PA-C  promethazine (PHENERGAN) 25 MG tablet Take 1 tablet (25 mg total) by mouth every 6 (six) hours as needed for nausea or vomiting. 01/24/18    Eber HongMiller, Brian, MD  fenofibrate 160 MG tablet Take 1 tablet (160 mg total) by mouth daily. 02/28/20 11/18/20  Tonny Bollmanooper, Michael, MD   Physical Exam: Vitals with BMI 11/24/2020 11/24/2020 11/24/2020  Height - - -  Weight - - -  BMI - - -  Systolic 107 86 74  Diastolic 59 53 53  Pulse 89 96 92     1. General:  in No  Acute distress    Chronically ill  -appearing 2. Psychological: Alert and  Oriented 3. Head/ENT:   Dry Mucous Membranes  Head Non traumatic, neck supple                          N Poor Dentition 4. SKIN:  decreased Skin turgor,  Skin clean Dry and intact no rash 5. Heart: Regular rate and rhythm no  Murmur, no Rub or gallop 6. Lungs:    no wheezes or crackles   7. Abdomen: Soft, non-tender, Non distended  bowel sounds present 8. Lower extremities: no clubbing, cyanosis, no  edema 9. Neurologically Grossly intact, moving all 4 extremities equally  10. MSK: Normal range of motion   All other LABS:     Recent Labs  Lab 11/24/20 1907  WBC 6.5  NEUTROABS 4.7  HGB 15.1  HCT 45.4  MCV 81.1  PLT 228     Recent Labs  Lab 11/24/20 1907  NA 133*  K 4.1  CL 101  CO2 20*  GLUCOSE 107*  BUN 22*  CREATININE 1.06  CALCIUM 8.6*     Recent Labs  Lab 11/24/20 1907  AST 39  ALT 25  ALKPHOS 53  BILITOT 0.5  PROT 7.1  ALBUMIN 3.5       Cultures: No results found for: SDES, SPECREQUEST, CULT, REPTSTATUS   Radiological Exams on Admission: DG Chest Port 1 View  Result Date: 11/24/2020 CLINICAL DATA:  COVID-19 positive 11/08/2020, weakness, cough, chills EXAM: PORTABLE CHEST 1 VIEW COMPARISON:  07/08/2012 FINDINGS: The heart size and mediastinal contours are within normal limits. Both lungs are clear. The visualized skeletal structures are unremarkable. IMPRESSION: No active disease. Electronically Signed   By: Sharlet Salina M.D.   On: 11/24/2020 18:45    Chart has been reviewed   Assessment/Plan  60 y.o. male with medical history  significant of  CAD, STEMI 2013, V fib arrest     Admitted for COVID infection with acute respiratory failure with hypoxia  Present on Admission: . Acute respiratory disease due to COVID-19 virus    FROM HOME  WITH KNOWN HX OF COVID19  Novel Corona Virus testing:   Positive 11/18/2020  Immunization status: UNVACINATED    Following concerning LAB/ imaging findings:  COVID-19 Labs  Recent Labs    11/24/20 1907  DDIMER 0.57*  FERRITIN 416*  LDH 223*  CRP 4.4*     Lab Results  Component Value Date   SARSCOV2NAA POSITIVE (A) 11/18/2020     CRP, LDH: increased   IL-6 and Ferritin increased    Procalcitonin: low      -Following work-up initiated:      sputum cultures Ordered 11/24/20, Blood cultures Ordered 11/24/20,     Following complications noted:     acute respiratory failure with hypoxia - continue oxygen treatment  Plan of treatment: Admit on Airborn Precautions  -given severity of illness initiate steroids solumedrol 0.5 mg/kg BID And pharmacy consult for remdesivir    IF Hypoxia progresses rapidly  or  requiring high nasal flow   and no contraindications will attempt a trial of Baricitinib or Actemra  Currently no indication  - Will follow daily d.dimer - Assess for ability to prone  - Supportive management -Fluid sparing resuscitation  -Provide oxygen as needed currently on   SpO2: 94 % O2 Flow Rate (L/min): 2 L/min - IF d.dimer elvated >5 will increase dose of lovenox    Poor Prognostic factors  60 y.o.  Personal hx of  CAD  HTN,  NON-Vaccinated status Evidence of  organ damage  Present tachypnea,  tachycardia present on admission      Will order Airborne and Contact precautions  Family/ patient prognosis discussion: I have discussed case with the family/ patient  who are aware of their prognosis At this point they would like    to be full code    The treatment plan and use of medications and known side effects were discussed with patient  It  was clearly explained that there is no proven definitive treatment for COVID-19 infection yet. Any medications used here are based on case reports/anecdotal data which are not peer-reviewed and has not been studied using randomized control trials.  Complete risks and long-term side effects are unknown, however in the best clinical judgment they seem to be of some clinical benefit rather than medical risks.  Patient  agree with the treatment plan and want to receive these treatments as indicated.     . Pure hypercholesterolemia stable continue home Medications  . CAD (coronary artery disease) hold Coreg given hypotension Continue otherwise home meds  . Acute respiratory failure with hypoxia (HCC) -  this patient has acute respiratory failure with Hypoxia  as documented by the presence of following: O2 saturatio< 90% on RA  Likely due to:  COVID pneumonia Provide O2 therapy and titrate as needed  Continuous pulse ox   check Pulse ox with ambulation prior to discharge   may need  TC consult for home O2 set up  Prone if able  flutter valve ordered    Other plan as per orders.  DVT prophylaxis:    Lovenox       Code Status:    Code Status: Prior FULL CODE  as per patient   I had personally discussed CODE STATUS with patient    Family Communication:   Family not at  Bedside    Disposition Plan:  To home once workup is complete and patient is stable   Following barriers for discharge:                            Electrolytes corrected                               Hypoxia stable                                                            Afebrile,                               Will need to be able to tolerate PO                            Will likely need home health, home O2, set up                           Will need consultants to evaluate patient prior to discharge                       Consults called: none Admission status:  ED Disposition    ED Disposition Condition Comment  Admit  Hospital Area: [facility] [[id number]]  Level of Care: Stepdown [14]  Admit to SDU based on following criteria: Hemodynamic compromise or significant risk of instability:  Patient requiring short term acute titration and management of vasoactive drips, and invasive monitoring (i.e., CVP and Arterial line).  May admit patient to [facility] or [facility] if equivalent level of care is available:: No  Covid Evaluation: Confirmed COVID Positive  Diagnosis: Acute respiratory disease due to COVID-19 virus [[id number]]  Admitting Physician: Therisa Doyne [[id number]]  Attending Physician: Therisa Doyne [[id number]]  Estimated length of stay: past midnight tomorrow  Certification:: I certify this patient will need inpatient services for at least 2 midnights         inpatient     I Expect 2 midnight stay secondary to severity of patient's current illness need for inpatient interventions justified by the following:  hemodynamic instability despite optimal treatment (tachycardia  Hypotension    Hypoxia, )  Severe lab/radiological/exam abnormalities including:   Hypoxia evidence of elevated inflammatory markers in the setting of COVID infection  and extensive comorbidities including: . CAD  That are currently affecting medical management.   I expect  patient to be hospitalized for 2 midnights requiring inpatient medical care.  Patient is at high risk for adverse outcome (such as loss of life or disability) if not treated.  Indication for inpatient stay as follows:    New or worsening hypoxia  Need for IV antivirals , IV steroids     Level of care  tele   indefinitely please discontinue once patient no longer qualifies COVID-19 Labs    Lab Results  Component Value Date   SARSCOV2NAA POSITIVE (A) 11/18/2020     Precautions: admitted as covid positive Airborne and Contact precautions    PPE: Used by the provider:   P100  eye Goggles,  Gloves   gown      Chloeanne Poteet 11/24/2020, 11:55 PM    [facility]     after 2 AM please page floor coverage PA If 7AM-7PM, please contact the day team taking care of the patient using [email or web address]   Patient was evaluated in the context of the global COVID-19 pandemic, which necessitated consideration that the patient might be at risk for infection with the SARS-CoV-2 virus that causes COVID-19. Institutional protocols and algorithms that pertain to the evaluation of patients at risk for COVID-19 are in a state of rapid change based on information released by regulatory bodies including the CDC and federal and state organizations. These policies and algorithms were followed during the patient's care.

## 2020-11-24 NOTE — ED Notes (Signed)
Garett, PA made aware of pt's BP of 74/53.  Pt reports feeling weak and did feel some dizziness while this RN placed IV.

## 2020-11-24 NOTE — ED Triage Notes (Signed)
Patient reports testing positive for covid on 1/17. Reports weakness, cough and chills. Denies SOB or chest pain. Has been taking prednisone and tylenol at home. Hx of MI in 2013.

## 2020-11-24 NOTE — ED Notes (Signed)
Pt was placed on 2L [place] due to O2 being 88% on RA.

## 2020-11-25 DIAGNOSIS — A419 Sepsis, unspecified organism: Secondary | ICD-10-CM

## 2020-11-25 DIAGNOSIS — J189 Pneumonia, unspecified organism: Secondary | ICD-10-CM | POA: Insufficient documentation

## 2020-11-25 DIAGNOSIS — J1282 Pneumonia due to coronavirus disease 2019: Secondary | ICD-10-CM | POA: Diagnosis present

## 2020-11-25 DIAGNOSIS — U071 COVID-19: Secondary | ICD-10-CM | POA: Diagnosis present

## 2020-11-25 LAB — CBC WITH DIFFERENTIAL/PLATELET
Abs Immature Granulocytes: 0.02 10*3/uL (ref 0.00–0.07)
Basophils Absolute: 0 10*3/uL (ref 0.0–0.1)
Basophils Relative: 0 %
Eosinophils Absolute: 0 10*3/uL (ref 0.0–0.5)
Eosinophils Relative: 0 %
HCT: 43.4 % (ref 39.0–52.0)
Hemoglobin: 14.2 g/dL (ref 13.0–17.0)
Immature Granulocytes: 0 %
Lymphocytes Relative: 16 %
Lymphs Abs: 0.7 10*3/uL (ref 0.7–4.0)
MCH: 26.9 pg (ref 26.0–34.0)
MCHC: 32.7 g/dL (ref 30.0–36.0)
MCV: 82.4 fL (ref 80.0–100.0)
Monocytes Absolute: 0.2 10*3/uL (ref 0.1–1.0)
Monocytes Relative: 4 %
Neutro Abs: 3.6 10*3/uL (ref 1.7–7.7)
Neutrophils Relative %: 80 %
Platelets: 192 10*3/uL (ref 150–400)
RBC: 5.27 MIL/uL (ref 4.22–5.81)
RDW: 14.3 % (ref 11.5–15.5)
WBC: 4.6 10*3/uL (ref 4.0–10.5)
nRBC: 0 % (ref 0.0–0.2)

## 2020-11-25 LAB — COMPREHENSIVE METABOLIC PANEL
ALT: 27 U/L (ref 0–44)
AST: 37 U/L (ref 15–41)
Albumin: 3.2 g/dL — ABNORMAL LOW (ref 3.5–5.0)
Alkaline Phosphatase: 49 U/L (ref 38–126)
Anion gap: 11 (ref 5–15)
BUN: 22 mg/dL — ABNORMAL HIGH (ref 6–20)
CO2: 21 mmol/L — ABNORMAL LOW (ref 22–32)
Calcium: 8.6 mg/dL — ABNORMAL LOW (ref 8.9–10.3)
Chloride: 102 mmol/L (ref 98–111)
Creatinine, Ser: 0.95 mg/dL (ref 0.61–1.24)
GFR, Estimated: >60 mL/min (ref >60)
Glucose, Bld: 182 mg/dL — ABNORMAL HIGH (ref 70–99)
Potassium: 4.1 mmol/L (ref 3.5–5.1)
Sodium: 134 mmol/L — ABNORMAL LOW (ref 135–145)
Total Bilirubin: 0.7 mg/dL (ref 0.3–1.2)
Total Protein: 6.6 g/dL (ref 6.5–8.1)

## 2020-11-25 LAB — PHOSPHORUS: Phosphorus: 3.9 mg/dL (ref 2.5–4.6)

## 2020-11-25 LAB — TROPONIN I (HIGH SENSITIVITY)
Troponin I (High Sensitivity): 8 ng/L (ref <18)
Troponin I (High Sensitivity): 6 ng/L (ref <18)

## 2020-11-25 LAB — HIV ANTIBODY (ROUTINE TESTING W REFLEX): HIV Screen 4th Generation wRfx: NONREACTIVE

## 2020-11-25 LAB — FERRITIN: Ferritin: 428 ng/mL — ABNORMAL HIGH (ref 24–336)

## 2020-11-25 LAB — ABO/RH: ABO/RH(D): B POS

## 2020-11-25 LAB — D-DIMER, QUANTITATIVE: D-Dimer, Quant: 0.57 ug/mL-FEU — ABNORMAL HIGH (ref 0.00–0.50)

## 2020-11-25 LAB — MAGNESIUM: Magnesium: 2.1 mg/dL (ref 1.7–2.4)

## 2020-11-25 LAB — MRSA PCR SCREENING: MRSA by PCR: NEGATIVE

## 2020-11-25 LAB — C-REACTIVE PROTEIN: CRP: 3.5 mg/dL — ABNORMAL HIGH (ref <1.0)

## 2020-11-25 MED ORDER — PANTOPRAZOLE SODIUM 40 MG PO TBEC
40.0000 mg | DELAYED_RELEASE_TABLET | Freq: Every day | ORAL | Status: DC
Start: 2020-11-25 — End: 2020-12-15
  Administered 2020-11-25 – 2020-12-15 (×21): 40 mg via ORAL
  Filled 2020-11-25 (×21): qty 1

## 2020-11-25 MED ORDER — ORAL CARE MOUTH RINSE
15.0000 mL | Freq: Two times a day (BID) | OROMUCOSAL | Status: DC
  Administered 2020-11-25 – 2020-12-15 (×36): 15 mL via OROMUCOSAL

## 2020-11-25 MED ORDER — ALBUTEROL SULFATE HFA 108 (90 BASE) MCG/ACT IN AERS
2.0000 | INHALATION_SPRAY | Freq: Four times a day (QID) | RESPIRATORY_TRACT | Status: DC
  Administered 2020-11-25: 2 via RESPIRATORY_TRACT
  Filled 2020-11-25: qty 6.7

## 2020-11-25 MED ORDER — ASPIRIN EC 81 MG PO TBEC
81.0000 mg | DELAYED_RELEASE_TABLET | Freq: Every day | ORAL | Status: DC
  Administered 2020-11-25 – 2020-12-15 (×21): 81 mg via ORAL
  Filled 2020-11-25 (×21): qty 1

## 2020-11-25 MED ORDER — PRAVASTATIN SODIUM 40 MG PO TABS
40.0000 mg | ORAL_TABLET | Freq: Every day | ORAL | Status: DC
  Administered 2020-11-25 – 2020-12-15 (×21): 40 mg via ORAL
  Filled 2020-11-25 (×2): qty 2
  Filled 2020-11-25: qty 1
  Filled 2020-11-25: qty 2
  Filled 2020-11-25 (×7): qty 1
  Filled 2020-11-25: qty 2
  Filled 2020-11-25 (×2): qty 1
  Filled 2020-11-25: qty 2
  Filled 2020-11-25 (×3): qty 1
  Filled 2020-11-25: qty 2
  Filled 2020-11-25: qty 1
  Filled 2020-11-25: qty 2

## 2020-11-25 MED ORDER — ONDANSETRON HCL 4 MG/2ML IJ SOLN: 4.0000 mg | Freq: Four times a day (QID) | INTRAMUSCULAR | Status: DC | PRN

## 2020-11-25 MED ORDER — SODIUM CHLORIDE 0.9% FLUSH
3.0000 mL | Freq: Two times a day (BID) | INTRAVENOUS | Status: DC
  Administered 2020-11-25 – 2020-12-15 (×42): 3 mL via INTRAVENOUS

## 2020-11-25 MED ORDER — ZINC SULFATE 220 (50 ZN) MG PO CAPS
220.0000 mg | ORAL_CAPSULE | Freq: Every day | ORAL | Status: DC
  Administered 2020-11-25 – 2020-12-15 (×21): 220 mg via ORAL
  Filled 2020-11-25 (×21): qty 1

## 2020-11-25 MED ORDER — ONDANSETRON HCL 4 MG PO TABS: 4.0000 mg | ORAL_TABLET | Freq: Four times a day (QID) | ORAL | Status: DC | PRN

## 2020-11-25 MED ORDER — IPRATROPIUM-ALBUTEROL 20-100 MCG/ACT IN AERS
1.0000 | INHALATION_SPRAY | Freq: Four times a day (QID) | RESPIRATORY_TRACT | Status: DC
  Administered 2020-11-25 – 2020-11-28 (×14): 1 via RESPIRATORY_TRACT
  Filled 2020-11-25 (×2): qty 4

## 2020-11-25 MED ORDER — ASCORBIC ACID 500 MG PO TABS
500.0000 mg | ORAL_TABLET | Freq: Every day | ORAL | Status: DC
  Administered 2020-11-25 – 2020-12-15 (×21): 500 mg via ORAL
  Filled 2020-11-25 (×21): qty 1

## 2020-11-25 MED ORDER — METHYLPREDNISOLONE SODIUM SUCC 125 MG IJ SOLR
60.0000 mg | Freq: Two times a day (BID) | INTRAMUSCULAR | Status: DC
  Administered 2020-11-25 – 2020-11-28 (×6): 60 mg via INTRAVENOUS
  Filled 2020-11-25 (×6): qty 2

## 2020-11-25 MED ORDER — PREDNISONE 20 MG PO TABS: 50.0000 mg | ORAL_TABLET | Freq: Every day | ORAL | Status: DC

## 2020-11-25 MED ORDER — CHLORHEXIDINE GLUCONATE CLOTH 2 % EX PADS
6.0000 | MEDICATED_PAD | Freq: Every day | CUTANEOUS | Status: DC
  Administered 2020-11-25 – 2020-11-28 (×3): 6 via TOPICAL

## 2020-11-25 MED ORDER — METHYLPREDNISOLONE SODIUM SUCC 125 MG IJ SOLR
0.5000 mg/kg | Freq: Two times a day (BID) | INTRAMUSCULAR | Status: DC
  Administered 2020-11-25: 46.875 mg via INTRAVENOUS
  Filled 2020-11-25: qty 2

## 2020-11-25 MED ORDER — CARVEDILOL 3.125 MG PO TABS
3.1250 mg | ORAL_TABLET | Freq: Two times a day (BID) | ORAL | Status: DC
  Administered 2020-11-25 – 2020-12-15 (×40): 3.125 mg via ORAL
  Filled 2020-11-25 (×40): qty 1

## 2020-11-25 MED ORDER — HYDROCODONE-ACETAMINOPHEN 5-325 MG PO TABS
1.0000 | ORAL_TABLET | ORAL | Status: DC | PRN
Start: 2020-11-25 — End: 2020-11-29

## 2020-11-25 MED ORDER — OMEPRAZOLE MAGNESIUM 20 MG PO TBEC: 20.0000 mg | DELAYED_RELEASE_TABLET | Freq: Every day | ORAL | Status: DC

## 2020-11-25 MED ORDER — GUAIFENESIN-DM 100-10 MG/5ML PO SYRP
10.0000 mL | ORAL_SOLUTION | ORAL | Status: DC | PRN
  Administered 2020-11-25 – 2020-11-29 (×6): 10 mL via ORAL
  Filled 2020-11-25 (×7): qty 10

## 2020-11-25 MED ORDER — SODIUM CHLORIDE 0.9 % IV SOLN: INTRAVENOUS | Status: AC

## 2020-11-25 MED ORDER — ACETAMINOPHEN 325 MG PO TABS
650.0000 mg | ORAL_TABLET | Freq: Four times a day (QID) | ORAL | Status: DC | PRN
  Administered 2020-11-29: 650 mg via ORAL
  Filled 2020-11-25: qty 2

## 2020-11-25 MED ORDER — ENOXAPARIN SODIUM 40 MG/0.4ML ~~LOC~~ SOLN
40.0000 mg | SUBCUTANEOUS | Status: DC
  Administered 2020-11-25 – 2020-11-30 (×6): 40 mg via SUBCUTANEOUS
  Filled 2020-11-25 (×6): qty 0.4

## 2020-11-25 NOTE — Plan of Care (Signed)
  Problem: Education: Goal: Knowledge of General Education information will improve Description: Including pain rating scale, medication(s)/side effects and non-pharmacologic comfort measures Outcome: Progressing   Problem: Activity: Goal: Risk for activity intolerance will decrease Outcome: Progressing   Problem: Coping: Goal: Level of anxiety will decrease Outcome: Progressing   Problem: Pain Managment: Goal: General experience of comfort will improve Outcome: Progressing   Problem: Education: Goal: Knowledge of risk factors and measures for prevention of condition will improve Outcome: Progressing   Problem: Respiratory: Goal: Complications related to the disease process, condition or treatment will be avoided or minimized Outcome: Progressing

## 2020-11-25 NOTE — Progress Notes (Signed)
PROGRESS NOTE    Curtis Rosales  EZM:[id number] DOB: February 25, 1961 DOA: 11/24/2020 PCP: [name], MD     Brief Narrative:  Curtis Rosales is a 60 y.o.WM PMHx CAD, STEMI 2013, V fib arrest, ischemic cardiomyopathy, HLD,    Presented with   progressive worsening symptoms of COVID Patient has had cough body aches fever starting on 14 January associated with sore throat headache and generalized fatigue. His wife has been sick prior to this and was tested for COVID and flu and was negative Patient eventually tested positive for COVID 17th January They message his cardiologist who recommended follow-up with primary care provider To see if infusion can be arranged for the patient He had steroids at home that was prescribed to him but did not seem to help. He reports decreased p.o. intake   Patient is unvaccinated   Subjective: T-max overnight 39.1 C, A/O x4.  States no he tested positive but only had a cough so went to work the next day, then began to feel sicker so came to the hospital.   Assessment & Plan: Covid vaccination; unvaccinated   Active Problems:   Cardiomyopathy, ischemic   CAD (coronary artery disease)   Pure hypercholesterolemia   Acute respiratory disease due to COVID-19 virus   Acute respiratory failure with hypoxia ([place])   Sepsis due to pneumonia ([place])   Pneumonia due to COVID-19 virus  Sepsis due to pneumonia -Patient met criteria for sepsis HR> 90, RR> 20, Temp> 38 C, site of infection lungs  Acute respiratory failure with hypoxia/Covid pneumonia COVID-19 Labs  Recent Labs    11/24/20 1907 11/25/20 0232  DDIMER 0.57* 0.57*  FERRITIN 416* 428*  LDH 223*  --   CRP 4.4* 3.5*    Lab Results  Component Value Date   SARSCOV2NAA POSITIVE (A) 11/18/2020   -Solu-Medrol 60 mg BID -Remdesivir per pharmacy protocol -, Vitamins per Covid protocol -Flutter valve -Incentive spirometry -Prone patient 16 hours/day if patient cannot tolerate prone 2 to 3 hours per  shift   CAD Restart Coreg 3.125 mg BID  HLD -Pravastatin 40 mg daily -Monitor liver enzymes closely    DVT prophylaxis: Lovenox Code Status: Full Family Communication:  Status is: Inpatient    Dispo: The patient is from: Home              Anticipated d/c is to: Home              Anticipated d/c date is: 1/30              Patient currently unstable      Consultants:    Procedures/Significant Events:    I have personally reviewed and interpreted all radiology studies and my findings are as above.  VENTILATOR SETTINGS: Nasal cannula 1/24 SPO2 89%   Cultures   Antimicrobials: Anti-infectives (From admission, onward)   Start     Dose/Rate Route Frequency Ordered Stop   11/25/20 1000  remdesivir 100 mg in sodium chloride 0.9 % 100 mL IVPB       "Followed by" Linked Group Details   100 mg 200 mL/hr over 30 Minutes Intravenous Daily 11/24/20 2033 11/29/20 0959   11/24/20 2200  remdesivir 200 mg in sodium chloride 0.9% 250 mL IVPB       "Followed by" Linked Group Details   200 mg 580 mL/hr over 30 Minutes Intravenous Once 11/24/20 2033 11/24/20 2310       Devices    LINES / TUBES:  Continuous Infusions: . remdesivir 100 mg in NS 100 mL Stopped (11/25/20 1110)     Objective: Vitals:   11/25/20 1300 11/25/20 1400 11/25/20 1500 11/25/20 1600  BP: 121/70 127/75 128/75 133/75  Pulse: 86 89 88 90  Resp: (!) 31 (!) 25 (!) 36 (!) 31  Temp:    (!) 97.5 F (36.4 C)  TempSrc:    Oral  SpO2: (!) 87% (!) 89% 91% 90%    Intake/Output Summary (Last 24 hours) at 11/25/2020 1710 Last data filed at 11/25/2020 1250 Gross per 24 hour  Intake 1201.71 ml  Output -  Net 1201.71 ml   There were no vitals filed for this visit.  Examination:  General: A/O x4, positive acute respiratory distress Eyes: negative scleral hemorrhage, negative anisocoria, negative icterus ENT: Negative Runny nose, negative gingival bleeding, Neck:  Negative scars, masses,  torticollis, lymphadenopathy, JVD Lungs: tachypneic decreased breath sounds bilaterally without wheezes or crackles Cardiovascular: Regular rate and rhythm without murmur gallop or rub normal S1 and S2 Abdomen: negative abdominal pain, nondistended, positive soft, bowel sounds, no rebound, no ascites, no appreciable mass Extremities: No significant cyanosis, clubbing, or edema bilateral lower extremities Skin: Negative rashes, lesions, ulcers Psychiatric:  Negative depression, negative anxiety, negative fatigue, negative mania  Central nervous system:  Cranial nerves II through XII intact, tongue/uvula midline, all extremities muscle strength 5/5, sensation intact throughout, negative dysarthria, negative expressive aphasia, negative receptive aphasia.  .     Data Reviewed: Care during the described time interval was provided by me .  I have reviewed this patient's available data, including medical history, events of note, physical examination, and all test results as part of my evaluation.  CBC: Recent Labs  Lab 11/24/20 1907 11/25/20 0232  WBC 6.5 4.6  NEUTROABS 4.7 3.6  HGB 15.1 14.2  HCT 45.4 43.4  MCV 81.1 82.4  PLT 228 536   Basic Metabolic Panel: Recent Labs  Lab 11/24/20 1907 11/25/20 0232  NA 133* 134*  K 4.1 4.1  CL 101 102  CO2 20* 21*  GLUCOSE 107* 182*  BUN 22* 22*  CREATININE 1.06 0.95  CALCIUM 8.6* 8.6*  MG  --  2.1  PHOS  --  3.9   GFR: Estimated Creatinine Clearance: 93.3 mL/min (by C-G formula based on SCr of 0.95 mg/dL). Liver Function Tests: Recent Labs  Lab 11/24/20 1907 11/25/20 0232  AST 39 37  ALT 25 27  ALKPHOS 53 49  BILITOT 0.5 0.7  PROT 7.1 6.6  ALBUMIN 3.5 3.2*   No results for input(s): LIPASE, AMYLASE in the last 168 hours. No results for input(s): AMMONIA in the last 168 hours. Coagulation Profile: No results for input(s): INR, PROTIME in the last 168 hours. Cardiac Enzymes: No results for input(s): CKTOTAL, CKMB,  CKMBINDEX, TROPONINI in the last 168 hours. BNP (last 3 results) No results for input(s): PROBNP in the last 8760 hours. HbA1C: No results for input(s): HGBA1C in the last 72 hours. CBG: No results for input(s): GLUCAP in the last 168 hours. Lipid Profile: Recent Labs    11/24/20 1917  TRIG 196*   Thyroid Function Tests: No results for input(s): TSH, T4TOTAL, FREET4, T3FREE, THYROIDAB in the last 72 hours. Anemia Panel: Recent Labs    11/24/20 1907 11/25/20 0232  FERRITIN 416* 428*   Sepsis Labs: Recent Labs  Lab 11/24/20 1906 11/24/20 1907 11/24/20 2010  PROCALCITON  --  <0.10  --   LATICACIDVEN 1.7  --  1.1    Recent  Results (from the past 240 hour(s))  SARS CORONAVIRUS 2 (TAT 6-24 HRS) Nasopharyngeal Nasopharyngeal Swab     Status: Abnormal   Collection Time: 11/18/20  2:15 PM   Specimen: Nasopharyngeal Swab  Result Value Ref Range Status   SARS Coronavirus 2 POSITIVE (A) NEGATIVE Final    Comment: (NOTE) SARS-CoV-2 target nucleic acids are DETECTED.  The SARS-CoV-2 RNA is generally detectable in upper and lower respiratory specimens during the acute phase of infection. Positive results are indicative of the presence of SARS-CoV-2 RNA. Clinical correlation with patient history and other diagnostic information is  necessary to determine patient infection status. Positive results do not rule out bacterial infection or co-infection with other viruses.  The expected result is Negative.  Fact Sheet for Patients: SugarRoll.be  Fact Sheet for Healthcare Providers: https://www.woods-mathews.com/  This test is not yet approved or cleared by the [place] FDA and  has been authorized for detection and/or diagnosis of SARS-CoV-2 by FDA under an Emergency Use Authorization (EUA). This EUA will remain  in effect (meaning this test can be used) for the duration of the COVID-19 declaration under Section 564(b)(1) of the Act,  21 U. S.C. section 360bbb-3(b)(1), unless the authorization is terminated or revoked sooner.   Performed at [facility], [place] [address], [place], [place] [address]   Blood Culture (routine x 2)     Status: None (Preliminary result)   Collection Time: 11/24/20  7:07 PM   Specimen: BLOOD  Result Value Ref Range Status   Specimen Description   Final    BLOOD RIGHT ARM Performed at [facility] [address], [place], [place] [address]    Special Requests   Final    BOTTLES DRAWN AEROBIC AND ANAEROBIC Blood Culture adequate volume Performed at [facility] [address], [place], [place] [address]    Culture   Final    NO GROWTH < 24 HOURS Performed at [facility] [address], [place], [place] [address]    Report Status PENDING  Incomplete  Blood Culture (routine x 2)     Status: None (Preliminary result)   Collection Time: 11/24/20  7:17 PM   Specimen: BLOOD  Result Value Ref Range Status   Specimen Description   Final    BLOOD LEFT ANTECUBITAL Performed at [facility] [address], [place], [place] [address]    Special Requests   Final    BOTTLES DRAWN AEROBIC AND ANAEROBIC Blood Culture adequate volume Performed at [facility] [address] [place], [place] [address]    Culture   Final    NO GROWTH < 24 HOURS Performed at [facility] [address], [place], [place] [address]    Report Status PENDING  Incomplete  Expectorated sputum assessment w rflx to resp cult     Status: None   Collection Time: 11/24/20  9:28 PM   Specimen: Sputum  Result Value Ref Range Status   Specimen Description SPUTUM  Final   Special Requests Normal  Final   Sputum evaluation   Final    THIS SPECIMEN IS ACCEPTABLE FOR SPUTUM CULTURE Performed at [facility], [place] [address] [place],  [address]    Report Status 11/24/2020 FINAL  Final  Culture,  respiratory     Status: None (Preliminary result)   Collection Time: 11/24/20  9:28 PM   Specimen: SPU  Result Value Ref Range Status  Specimen Description   Final    SPUTUM Performed at [facility] [address], [place], [place] [address]    Special Requests   Final    Normal Reflexed from [id number] Performed at [facility], [place] [address], [place], [place] [address]    Gram Stain   Final    RARE WBC PRESENT, PREDOMINANTLY PMN ABUNDANT GRAM NEGATIVE RODS FEW GRAM POSITIVE COCCI IN PAIRS IN CLUSTERS Performed at [facility], [place] [address], [place], [place] [address]    Culture PENDING  Incomplete   Report Status PENDING  Incomplete  MRSA PCR Screening     Status: None   Collection Time: 11/25/20 12:01 AM   Specimen: Nasal Mucosa; Nasopharyngeal  Result Value Ref Range Status   MRSA by PCR NEGATIVE NEGATIVE Final    Comment:        The GeneXpert MRSA Assay (FDA approved for NASAL specimens only), is one component of a comprehensive MRSA colonization surveillance program. It is not intended to diagnose MRSA infection nor to guide or monitor treatment for MRSA infections. Performed at [facility], [facility], [place] [address] [place], [place] [address]          Radiology Studies: DG Chest Port 1 View  Result Date: 11/24/2020 CLINICAL DATA:  COVID-19 positive 11/08/2020, weakness, cough, chills EXAM: PORTABLE CHEST 1 VIEW COMPARISON:  07/08/2012 FINDINGS: The heart size and mediastinal contours are within normal limits. Both lungs are clear. The visualized skeletal structures are unremarkable. IMPRESSION: No active disease. Electronically Signed   By: Randa Ngo M.D.   On: 11/24/2020 18:45        Scheduled Meds: . vitamin C  500 mg Oral Daily  . aspirin EC  81 mg Oral Daily  . carvedilol  3.125 mg Oral BID  . Chlorhexidine Gluconate Cloth  6 each Topical Daily  . enoxaparin (LOVENOX) injection  40 mg  Subcutaneous Q24H  . Ipratropium-Albuterol  1 puff Inhalation Q6H  . mouth rinse  15 mL Mouth Rinse BID  . methylPREDNISolone (SOLU-MEDROL) injection  60 mg Intravenous Q12H  . pantoprazole  40 mg Oral Daily  . pravastatin  40 mg Oral Daily  . sodium chloride flush  3 mL Intravenous Q12H  . zinc sulfate  220 mg Oral Daily   Continuous Infusions: . remdesivir 100 mg in NS 100 mL Stopped (11/25/20 1110)     LOS: 1 day    Time spent:40 min    WOODS, Geraldo Docker, MD [facility] Pager [phone]  If 7PM-7AM, please contact night-coverage [email or web address] Password [email or web address] 11/25/2020, 5:10 PM

## 2020-11-25 NOTE — TOC Initial Note (Signed)
Transition of Care [facility]) - Initial/Assessment Note    Patient Details  Name: Curtis Rosales MRN: [id number] Date of Birth: [birth year]  [facility] CM/SW Contact:    Golda Acre, RN Phone Number: 11/25/2020, 10:03 AM  Clinical Narrative:                 60 y.o. male with medical history significant of  CAD, STEMI 2013, V fib arrest    Presented with   progressive worsening symptoms of COVID Patient has had cough body aches fever starting on 14 January associated with sore throat headache and generalized fatigue. His wife has been sick prior to this and was tested for COVID and flu and was negative Patient eventually tested positive for COVID 17th January They message his cardiologist who recommended follow-up with primary care provider To see if infusion can be arranged for the patient He had steroids at home that was prescribed to him but did not seem to help. He reports decreased p.o. intake   Patient is unvaccinated PLAN TO RETURN TO HOME WITH SELF CARE WILL FOLLOW TO SEE IF NEEDS HOME DME OR HHC FOLLOWING FOR PROGRESSION. Expected Discharge Plan: Home/Self Care Barriers to Discharge: Continued Medical Work up   Patient Goals and CMS Choice Patient states their goals for this hospitalization and ongoing recovery are:: TO GO HOME CMS Medicare.gov Compare Post Acute Care list provided to:: Patient    Expected Discharge Plan and Services Expected Discharge Plan: Home/Self Care   Discharge Planning Services: CM Consult   Living arrangements for the past 2 months: Single Family Home                                      Prior Living Arrangements/Services Living arrangements for the past 2 months: Single Family Home Lives with:: Spouse Patient language and need for interpreter reviewed:: Yes Do you feel safe going back to the place where you live?: Yes      Need for Family Participation in Patient Care: Yes (Comment) Care giver support system in  place?: Yes (comment)   Criminal Activity/Legal Involvement Pertinent to Current Situation/Hospitalization: No - Comment as needed  Activities of Daily Living      Permission Sought/Granted                  Emotional Assessment Appearance:: Appears stated age Attitude/Demeanor/Rapport: Engaged Affect (typically observed): Calm Orientation: : Oriented to Place,Oriented to Self,Oriented to  Time,Oriented to Situation Alcohol / Substance Use: Not Applicable Psych Involvement: No (comment)  Admission diagnosis:  Hypoxia [R09.02] Acute respiratory disease due to COVID-19 virus [U07.1, J06.9] COVID-19 [U07.1] Patient Active Problem List   Diagnosis Date Noted  . Acute respiratory disease due to COVID-19 virus 11/24/2020  . Acute respiratory failure with hypoxia ([facility]) 11/24/2020  . Hypertriglyceridemia 02/25/2015  . Pure hypercholesterolemia 09/14/2014  . Acute MI, anterolateral wall, initial episode of care ([facility]) 07/12/2012  . Cardiomyopathy, ischemic 07/12/2012  . CAD (coronary artery disease) 07/12/2012  . Ventricular fibrillation ([facility]) 07/08/2012   PCP:  Ambrose Mantle, MD Pharmacy:   [facility] [address] [place] - [address] [place] [address] Phone: [phone] Fax: [phone]     Social Determinants of Health (SDOH) Interventions    Readmission Risk Interventions No flowsheet data found.

## 2020-11-26 LAB — COMPREHENSIVE METABOLIC PANEL
ALT: 29 U/L (ref 0–44)
AST: 37 U/L (ref 15–41)
Albumin: 3.1 g/dL — ABNORMAL LOW (ref 3.5–5.0)
Alkaline Phosphatase: 50 U/L (ref 38–126)
Anion gap: 10 (ref 5–15)
BUN: 26 mg/dL — ABNORMAL HIGH (ref 6–20)
CO2: 22 mmol/L (ref 22–32)
Calcium: 8.8 mg/dL — ABNORMAL LOW (ref 8.9–10.3)
Chloride: 105 mmol/L (ref 98–111)
Creatinine, Ser: 0.83 mg/dL (ref 0.61–1.24)
GFR, Estimated: >60 mL/min (ref >60)
Glucose, Bld: 229 mg/dL — ABNORMAL HIGH (ref 70–99)
Potassium: 4.5 mmol/L (ref 3.5–5.1)
Sodium: 137 mmol/L (ref 135–145)
Total Bilirubin: 0.6 mg/dL (ref 0.3–1.2)
Total Protein: 6.5 g/dL (ref 6.5–8.1)

## 2020-11-26 LAB — CBC WITH DIFFERENTIAL/PLATELET
Abs Immature Granulocytes: 0.03 10*3/uL (ref 0.00–0.07)
Basophils Absolute: 0 10*3/uL (ref 0.0–0.1)
Basophils Relative: 0 %
Eosinophils Absolute: 0 10*3/uL (ref 0.0–0.5)
Eosinophils Relative: 0 %
HCT: 44.7 % (ref 39.0–52.0)
Hemoglobin: 14.3 g/dL (ref 13.0–17.0)
Immature Granulocytes: 0 %
Lymphocytes Relative: 10 %
Lymphs Abs: 0.8 10*3/uL (ref 0.7–4.0)
MCH: 26.6 pg (ref 26.0–34.0)
MCHC: 32 g/dL (ref 30.0–36.0)
MCV: 83.1 fL (ref 80.0–100.0)
Monocytes Absolute: 0.4 10*3/uL (ref 0.1–1.0)
Monocytes Relative: 5 %
Neutro Abs: 7.2 10*3/uL (ref 1.7–7.7)
Neutrophils Relative %: 85 %
Platelets: 228 10*3/uL (ref 150–400)
RBC: 5.38 MIL/uL (ref 4.22–5.81)
RDW: 14.4 % (ref 11.5–15.5)
WBC: 8.5 10*3/uL (ref 4.0–10.5)
nRBC: 0 % (ref 0.0–0.2)

## 2020-11-26 LAB — MAGNESIUM: Magnesium: 2.3 mg/dL (ref 1.7–2.4)

## 2020-11-26 LAB — HEPATITIS PANEL, ACUTE
HCV Ab: NONREACTIVE
Hep A IgM: NONREACTIVE
Hep B C IgM: NONREACTIVE
Hepatitis B Surface Ag: NONREACTIVE

## 2020-11-26 LAB — PHOSPHORUS: Phosphorus: 3.4 mg/dL (ref 2.5–4.6)

## 2020-11-26 LAB — D-DIMER, QUANTITATIVE: D-Dimer, Quant: 0.43 ug/mL-FEU (ref 0.00–0.50)

## 2020-11-26 LAB — C-REACTIVE PROTEIN: CRP: 0.8 mg/dL (ref <1.0)

## 2020-11-26 LAB — FERRITIN: Ferritin: 593 ng/mL — ABNORMAL HIGH (ref 24–336)

## 2020-11-26 NOTE — Progress Notes (Signed)
PROGRESS NOTE    Curtis Rosales  BTD:[id number] DOB: 28-Dec-1960 DOA: 11/24/2020 PCP: [name], MD     Brief Narrative:  Curtis Rosales is a 60 y.o.WM PMHx CAD, STEMI 2013, V fib arrest, ischemic cardiomyopathy, HLD,    Presented with   progressive worsening symptoms of COVID Patient has had cough body aches fever starting on 14 January associated with sore throat headache and generalized fatigue. His wife has been sick prior to this and was tested for COVID and flu and was negative Patient eventually tested positive for COVID 17th January They message his cardiologist who recommended follow-up with primary care provider To see if infusion can be arranged for the patient He had steroids at home that was prescribed to him but did not seem to help. He reports decreased p.o. intake   Patient is unvaccinated   Subjective: 1/25 afebrile overnight A/O x4,   Assessment & Plan: Covid vaccination; unvaccinated   Active Problems:   Cardiomyopathy, ischemic   CAD (coronary artery disease)   Pure hypercholesterolemia   Acute respiratory disease due to COVID-19 virus   Acute respiratory failure with hypoxia ([place])   Sepsis due to pneumonia ([place])   Pneumonia due to COVID-19 virus  Sepsis due to pneumonia -Patient met criteria for sepsis HR> 90, RR> 20, Temp> 38 C, site of infection lungs  Acute respiratory failure with hypoxia/Covid pneumonia COVID-19 Labs  Recent Labs    11/24/20 1907 11/25/20 0232 11/26/20 0259  DDIMER 0.57* 0.57* 0.43  FERRITIN 416* 428* 593*  LDH 223*  --   --   CRP 4.4* 3.5* 0.8    Lab Results  Component Value Date   SARSCOV2NAA POSITIVE (A) 11/18/2020   -Solu-Medrol 60 mg BID -Remdesivir per pharmacy protocol -, Vitamins per Covid protocol -Flutter valve -Incentive spirometry -Prone patient 16 hours/day if patient cannot tolerate prone 2 to 3 hours per shift  -Worsening respiratory status patient counseled to use incentive spirometry and flutter  valve more often as well as prone  CAD Restart Coreg 3.125 mg BID  HLD -Pravastatin 40 mg daily -Monitor liver enzymes closely    DVT prophylaxis: Lovenox Code Status: Full Family Communication:  Status is: Inpatient    Dispo: The patient is from: Home              Anticipated d/c is to: Home              Anticipated d/c date is: 1/30              Patient currently unstable      Consultants:    Procedures/Significant Events:    I have personally reviewed and interpreted all radiology studies and my findings are as above.  VENTILATOR SETTINGS: Nasal cannula 1/25 Flow 8 L/min SPO2 90%   Cultures   Antimicrobials: Anti-infectives (From admission, onward)   Start     Dose/Rate Route Frequency Ordered Stop   11/25/20 1000  remdesivir 100 mg in sodium chloride 0.9 % 100 mL IVPB       "Followed by" Linked Group Details   100 mg 200 mL/hr over 30 Minutes Intravenous Daily 11/24/20 2033 11/29/20 0959   11/24/20 2200  remdesivir 200 mg in sodium chloride 0.9% 250 mL IVPB       "Followed by" Linked Group Details   200 mg 580 mL/hr over 30 Minutes Intravenous Once 11/24/20 2033 11/24/20 2310       Devices    LINES / TUBES:  Continuous Infusions: . remdesivir 100 mg in NS 100 mL 100 mg (11/26/20 0828)     Objective: Vitals:   11/26/20 0400 11/26/20 0500 11/26/20 0600 11/26/20 0821  BP: 127/78  124/64 119/70  Pulse: 80 63 79 89  Resp: (!) 22 (!) 22 (!) 28   Temp:  98.2 F (36.8 C)    TempSrc:  Oral    SpO2: 90% 90% 90%     Intake/Output Summary (Last 24 hours) at 11/26/2020 0829 Last data filed at 11/25/2020 1250 Gross per 24 hour  Intake 570.71 ml  Output --  Net 570.71 ml   There were no vitals filed for this visit.  Physical Exam:  General: A/O x4, positive acute respiratory distress (worsening) Eyes: negative scleral hemorrhage, negative anisocoria, negative icterus ENT: Negative Runny nose, negative gingival bleeding, Neck:   Negative scars, masses, torticollis, lymphadenopathy, JVD Lungs: tachypneic, decreased breath sounds bilaterally without wheezes or crackles Cardiovascular: Regular rate and rhythm without murmur gallop or rub normal S1 and S2 Abdomen: negative abdominal pain, nondistended, positive soft, bowel sounds, no rebound, no ascites, no appreciable mass Extremities: No significant cyanosis, clubbing, or edema bilateral lower extremities Skin: Negative rashes, lesions, ulcers Psychiatric:  Negative depression, negative anxiety, negative fatigue, negative mania  Central nervous system:  Cranial nerves II through XII intact, tongue/uvula midline, all extremities muscle strength 5/5, sensation intact throughout, finger nose finger bilateral within normal limits, quick finger touch bilateral within normal limits, negative Romberg sign, heel to shin bilateral within normal limits, standing on 1 foot bilateral within normal limits, walking on tiptoes within normal limits, walking on heels within normal limits, negative dysarthria, negative expressive aphasia, negative receptive aphasia.  .     Data Reviewed: Care during the described time interval was provided by me .  I have reviewed this patient's available data, including medical history, events of note, physical examination, and all test results as part of my evaluation.  CBC: Recent Labs  Lab 11/24/20 1907 11/25/20 0232 11/26/20 0259  WBC 6.5 4.6 8.5  NEUTROABS 4.7 3.6 7.2  HGB 15.1 14.2 14.3  HCT 45.4 43.4 44.7  MCV 81.1 82.4 83.1  PLT 228 192 842   Basic Metabolic Panel: Recent Labs  Lab 11/24/20 1907 11/25/20 0232 11/26/20 0259  NA 133* 134* 137  K 4.1 4.1 4.5  CL 101 102 105  CO2 20* 21* 22  GLUCOSE 107* 182* 229*  BUN 22* 22* 26*  CREATININE 1.06 0.95 0.83  CALCIUM 8.6* 8.6* 8.8*  MG  --  2.1 2.3  PHOS  --  3.9 3.4   GFR: Estimated Creatinine Clearance: 106.8 mL/min (by C-G formula based on SCr of 0.83 mg/dL). Liver Function  Tests: Recent Labs  Lab 11/24/20 1907 11/25/20 0232 11/26/20 0259  AST 39 37 37  ALT 25 27 29   ALKPHOS 53 49 50  BILITOT 0.5 0.7 0.6  PROT 7.1 6.6 6.5  ALBUMIN 3.5 3.2* 3.1*   No results for input(s): LIPASE, AMYLASE in the last 168 hours. No results for input(s): AMMONIA in the last 168 hours. Coagulation Profile: No results for input(s): INR, PROTIME in the last 168 hours. Cardiac Enzymes: No results for input(s): CKTOTAL, CKMB, CKMBINDEX, TROPONINI in the last 168 hours. BNP (last 3 results) No results for input(s): PROBNP in the last 8760 hours. HbA1C: No results for input(s): HGBA1C in the last 72 hours. CBG: No results for input(s): GLUCAP in the last 168 hours. Lipid Profile: Recent Labs    11/24/20 1917  TRIG 196*   Thyroid Function Tests: No results for input(s): TSH, T4TOTAL, FREET4, T3FREE, THYROIDAB in the last 72 hours. Anemia Panel: Recent Labs    11/25/20 0232 11/26/20 0259  FERRITIN 428* 593*   Sepsis Labs: Recent Labs  Lab 11/24/20 1906 11/24/20 1907 11/24/20 2010  PROCALCITON  --  <0.10  --   LATICACIDVEN 1.7  --  1.1    Recent Results (from the past 240 hour(s))  SARS CORONAVIRUS 2 (TAT 6-24 HRS) Nasopharyngeal Nasopharyngeal Swab     Status: Abnormal   Collection Time: 11/18/20  2:15 PM   Specimen: Nasopharyngeal Swab  Result Value Ref Range Status   SARS Coronavirus 2 POSITIVE (A) NEGATIVE Final    Comment: (NOTE) SARS-CoV-2 target nucleic acids are DETECTED.  The SARS-CoV-2 RNA is generally detectable in upper and lower respiratory specimens during the acute phase of infection. Positive results are indicative of the presence of SARS-CoV-2 RNA. Clinical correlation with patient history and other diagnostic information is  necessary to determine patient infection status. Positive results do not rule out bacterial infection or co-infection with other viruses.  The expected result is Negative.  Fact Sheet for  Patients: SugarRoll.be  Fact Sheet for Healthcare Providers: https://www.[employer]ch-mathews.com/  This test is not yet approved or cleared by the [place] FDA and  has been authorized for detection and/or diagnosis of SARS-CoV-2 by FDA under an Emergency Use Authorization (EUA). This EUA will remain  in effect (meaning this test can be used) for the duration of the COVID-19 declaration under Section 564(b)(1) of the Act, 21 U. S.C. section 360bbb-3(b)(1), unless the authorization is terminated or revoked sooner.   Performed at [facility], [place] [address], [place], [place] [address]   Blood Culture (routine x 2)     Status: None (Preliminary result)   Collection Time: 11/24/20  7:07 PM   Specimen: BLOOD  Result Value Ref Range Status   Specimen Description   Final    BLOOD RIGHT ARM Performed at [facility] [address], [place], [place] [address]    Special Requests   Final    BOTTLES DRAWN AEROBIC AND ANAEROBIC Blood Culture adequate volume Performed at [facility] [address], [place], [place] [address]    Culture   Final    NO GROWTH < 24 HOURS Performed at [facility] [address], [place], [place] [address]    Report Status PENDING  Incomplete  Blood Culture (routine x 2)     Status: None (Preliminary result)   Collection Time: 11/24/20  7:17 PM   Specimen: BLOOD  Result Value Ref Range Status   Specimen Description   Final    BLOOD LEFT ANTECUBITAL Performed at [facility] [address], [place], [place] [address]    Special Requests   Final    BOTTLES DRAWN AEROBIC AND ANAEROBIC Blood Culture adequate volume Performed at [facility] [address], [place], [place] [address]    Culture   Final    NO GROWTH < 24 HOURS Performed at [facility] [address], [place],  [address]    Report Status  PENDING  Incomplete  Expectorated sputum assessment w rflx to resp cult     Status: None   Collection Time: 11/24/20  9:28 PM   Specimen: Sputum  Result Value Ref Range Status   Specimen Description SPUTUM  Final   Special Requests Normal  Final  Sputum evaluation   Final    THIS SPECIMEN IS ACCEPTABLE FOR SPUTUM CULTURE Performed at [facility] [address] [place], [place] [address]    Report Status 11/24/2020 FINAL  Final  Culture, respiratory     Status: None (Preliminary result)   Collection Time: 11/24/20  9:28 PM   Specimen: SPU  Result Value Ref Range Status   Specimen Description   Final    SPUTUM Performed at [facility] [address] [place], [place] [address]    Special Requests   Final    Normal Reflexed from [id number] Performed at [facility], [place] [address] [place], [place] [address]    Gram Stain   Final    RARE WBC PRESENT, PREDOMINANTLY PMN ABUNDANT GRAM NEGATIVE RODS FEW GRAM POSITIVE COCCI IN PAIRS IN CLUSTERS Performed at [facility], [place] [address] [place], [place] [address]    Culture PENDING  Incomplete   Report Status PENDING  Incomplete  MRSA PCR Screening     Status: None   Collection Time: 11/25/20 12:01 AM   Specimen: Nasal Mucosa; Nasopharyngeal  Result Value Ref Range Status   MRSA by PCR NEGATIVE NEGATIVE Final    Comment:        The GeneXpert MRSA Assay (FDA approved for NASAL specimens only), is one component of a comprehensive MRSA colonization surveillance program. It is not intended to diagnose MRSA infection nor to guide or monitor treatment for MRSA infections. Performed at [facility], [place] [address] [place], [place] [address]          Radiology Studies: DG Chest Port 1 View  Result Date: 11/24/2020 CLINICAL DATA:  COVID-19 positive 11/08/2020, weakness, cough, chills EXAM: PORTABLE CHEST 1 VIEW COMPARISON:  07/08/2012  FINDINGS: The heart size and mediastinal contours are within normal limits. Both lungs are clear. The visualized skeletal structures are unremarkable. IMPRESSION: No active disease. Electronically Signed   By: Randa Ngo M.D.   On: 11/24/2020 18:45        Scheduled Meds: . vitamin C  500 mg Oral Daily  . aspirin EC  81 mg Oral Daily  . carvedilol  3.125 mg Oral BID  . Chlorhexidine Gluconate Cloth  6 each Topical Daily  . enoxaparin (LOVENOX) injection  40 mg Subcutaneous Q24H  . Ipratropium-Albuterol  1 puff Inhalation Q6H  . mouth rinse  15 mL Mouth Rinse BID  . methylPREDNISolone (SOLU-MEDROL) injection  60 mg Intravenous Q12H  . pantoprazole  40 mg Oral Daily  . pravastatin  40 mg Oral Daily  . sodium chloride flush  3 mL Intravenous Q12H  . zinc sulfate  220 mg Oral Daily   Continuous Infusions: . remdesivir 100 mg in NS 100 mL 100 mg (11/26/20 0828)     LOS: 2 days    Time spent:40 min    Annaliese Saez, Geraldo Docker, MD [facility] Pager [phone]  If 7PM-7AM, please contact night-coverage [email or web address] Password [facility] 11/26/2020, 8:29 AM

## 2020-11-27 LAB — C-REACTIVE PROTEIN: CRP: 0.6 mg/dL (ref <1.0)

## 2020-11-27 LAB — CBC WITH DIFFERENTIAL/PLATELET
Abs Immature Granulocytes: 0.04 10*3/uL (ref 0.00–0.07)
Basophils Absolute: 0 10*3/uL (ref 0.0–0.1)
Basophils Relative: 0 %
Eosinophils Absolute: 0 10*3/uL (ref 0.0–0.5)
Eosinophils Relative: 0 %
HCT: 43.8 % (ref 39.0–52.0)
Hemoglobin: 14.3 g/dL (ref 13.0–17.0)
Immature Granulocytes: 0 %
Lymphocytes Relative: 7 %
Lymphs Abs: 0.8 10*3/uL (ref 0.7–4.0)
MCH: 27.2 pg (ref 26.0–34.0)
MCHC: 32.6 g/dL (ref 30.0–36.0)
MCV: 83.3 fL (ref 80.0–100.0)
Monocytes Absolute: 0.4 10*3/uL (ref 0.1–1.0)
Monocytes Relative: 4 %
Neutro Abs: 10.3 10*3/uL — ABNORMAL HIGH (ref 1.7–7.7)
Neutrophils Relative %: 89 %
Platelets: 252 10*3/uL (ref 150–400)
RBC: 5.26 MIL/uL (ref 4.22–5.81)
RDW: 14.4 % (ref 11.5–15.5)
WBC: 11.5 10*3/uL — ABNORMAL HIGH (ref 4.0–10.5)
nRBC: 0 % (ref 0.0–0.2)

## 2020-11-27 LAB — COMPREHENSIVE METABOLIC PANEL
ALT: 37 U/L (ref 0–44)
AST: 38 U/L (ref 15–41)
Albumin: 3.2 g/dL — ABNORMAL LOW (ref 3.5–5.0)
Alkaline Phosphatase: 51 U/L (ref 38–126)
Anion gap: 12 (ref 5–15)
BUN: 27 mg/dL — ABNORMAL HIGH (ref 6–20)
CO2: 21 mmol/L — ABNORMAL LOW (ref 22–32)
Calcium: 8.6 mg/dL — ABNORMAL LOW (ref 8.9–10.3)
Chloride: 103 mmol/L (ref 98–111)
Creatinine, Ser: 0.87 mg/dL (ref 0.61–1.24)
GFR, Estimated: >60 mL/min (ref >60)
Glucose, Bld: 203 mg/dL — ABNORMAL HIGH (ref 70–99)
Potassium: 4.6 mmol/L (ref 3.5–5.1)
Sodium: 136 mmol/L (ref 135–145)
Total Bilirubin: 0.7 mg/dL (ref 0.3–1.2)
Total Protein: 6.6 g/dL (ref 6.5–8.1)

## 2020-11-27 LAB — PHOSPHORUS: Phosphorus: 3.7 mg/dL (ref 2.5–4.6)

## 2020-11-27 LAB — GLUCOSE, CAPILLARY
Glucose-Capillary: 224 mg/dL — ABNORMAL HIGH (ref 70–99)
Glucose-Capillary: 219 mg/dL — ABNORMAL HIGH (ref 70–99)
Glucose-Capillary: 172 mg/dL — ABNORMAL HIGH (ref 70–99)

## 2020-11-27 LAB — CULTURE, RESPIRATORY W GRAM STAIN
Culture: NORMAL
Special Requests: NORMAL

## 2020-11-27 LAB — D-DIMER, QUANTITATIVE: D-Dimer, Quant: 0.43 ug/mL-FEU (ref 0.00–0.50)

## 2020-11-27 LAB — FERRITIN: Ferritin: 607 ng/mL — ABNORMAL HIGH (ref 24–336)

## 2020-11-27 LAB — MAGNESIUM: Magnesium: 2.3 mg/dL (ref 1.7–2.4)

## 2020-11-27 LAB — HEMOGLOBIN A1C
Hgb A1c MFr Bld: 6.8 % — ABNORMAL HIGH (ref 4.8–5.6)
Mean Plasma Glucose: 148.46 mg/dL

## 2020-11-27 MED ORDER — INSULIN ASPART 100 UNIT/ML ~~LOC~~ SOLN
0.0000 [IU] | Freq: Three times a day (TID) | SUBCUTANEOUS | Status: DC
  Administered 2020-11-27 (×2): 3 [IU] via SUBCUTANEOUS
  Administered 2020-11-28: 2 [IU] via SUBCUTANEOUS
  Administered 2020-11-28: 5 [IU] via SUBCUTANEOUS
  Administered 2020-11-29: 1 [IU] via SUBCUTANEOUS
  Administered 2020-11-29 (×2): 2 [IU] via SUBCUTANEOUS
  Administered 2020-11-30: 3 [IU] via SUBCUTANEOUS
  Administered 2020-11-30: 2 [IU] via SUBCUTANEOUS
  Administered 2020-11-30: 1 [IU] via SUBCUTANEOUS
  Administered 2020-12-01: 3 [IU] via SUBCUTANEOUS
  Administered 2020-12-01: 2 [IU] via SUBCUTANEOUS
  Administered 2020-12-01: 3 [IU] via SUBCUTANEOUS
  Administered 2020-12-02 (×2): 2 [IU] via SUBCUTANEOUS
  Administered 2020-12-02 – 2020-12-03 (×2): 5 [IU] via SUBCUTANEOUS
  Administered 2020-12-03 – 2020-12-04 (×3): 2 [IU] via SUBCUTANEOUS
  Administered 2020-12-04 – 2020-12-05 (×2): 3 [IU] via SUBCUTANEOUS
  Administered 2020-12-05 (×2): 2 [IU] via SUBCUTANEOUS
  Administered 2020-12-06: 3 [IU] via SUBCUTANEOUS
  Administered 2020-12-06: 1 [IU] via SUBCUTANEOUS
  Administered 2020-12-06 – 2020-12-07 (×2): 2 [IU] via SUBCUTANEOUS
  Administered 2020-12-07: 1 [IU] via SUBCUTANEOUS
  Administered 2020-12-07: 3 [IU] via SUBCUTANEOUS
  Administered 2020-12-08 (×2): 2 [IU] via SUBCUTANEOUS
  Administered 2020-12-08 – 2020-12-09 (×3): 1 [IU] via SUBCUTANEOUS
  Administered 2020-12-09 – 2020-12-12 (×6): 2 [IU] via SUBCUTANEOUS
  Administered 2020-12-13 (×2): 1 [IU] via SUBCUTANEOUS
  Administered 2020-12-14: 2 [IU] via SUBCUTANEOUS
  Administered 2020-12-14 – 2020-12-15 (×2): 1 [IU] via SUBCUTANEOUS

## 2020-11-27 NOTE — Progress Notes (Addendum)
PROGRESS NOTE    Waller Marcussen  TFT:[id number] DOB: 1961-03-26 DOA: 11/24/2020 PCP: Ambrose Mantle, MD    Brief Narrative:  Mr. Seddon was admitted to the hospital with a working diagnosis of acute hypoxic respiratory failure due to SARS COVID-19 viral pneumonia.  60 year old male unvaccinated for COVID-19 with past medical history for coronary artery disease, ischemic cardiomyopathy who tested positive for COVID-19 11/18/2020, his symptoms were consistent with body aches, cough and fevers, he received steroids as an outpatient with no improvement of his symptoms. On his initial physical examination his oxygenation was in the mid 80s with exertion, he was febrile 101.55F, respiratory rate 33, oxygen saturation 93% on supplemental oxygen, heart rate 102, blood pressure 137/86, his lungs had no wheezing or rails, heart S1-S2, present rhythmic, soft abdomen, no lower extremity edema. SARS COVID-19 positive,  Chest radiograph with faint interstitial infiltrates bilaterally upper lobes, predominantly on the right side. EKG 103 bpm, normal axis, normal intervals, sinus rhythm, no ST segment or T wave changes.  Assessment & Plan:   Principal Problem:   Acute respiratory failure with hypoxia (H[facility]) Active Problems:   Cardiomyopathy, ischemic   CAD (coronary artery disease)   Pure hypercholesterolemia   Acute respiratory disease due to COVID-19 virus   Sepsis due to pneumonia ([facility])   Pneumonia due to COVID-19 virus   1. Acute hypoxemic respiratory failure due to SARS COVId 19 viral pneumonia.  RR: 23  Pulse oxymetry: 88 to 91%  Fi02: 15 L/ min per Non-rebreather mask.   COVID-19 Labs  Recent Labs    11/24/20 1907 11/25/20 0232 11/26/20 0259 11/27/20 0259  DDIMER 0.57* 0.57* 0.43 0.43  FERRITIN 416* 428* 593* 607*  LDH 223*  --   --   --   CRP 4.4* 3.5* 0.8 0.6    Lab Results  Component Value Date   SARSCOV2NAA POSITIVE (A) 11/18/2020   Inflammatory markers trending down, but he  continue to have significant dyspnea, more on exertion.   Continue medical therapy with methylprednisolone 60 mg IV q12 and remdesivir. On bronchodilator therapy, antitussive agents and airway clearing techniques. Consult PT and OT, out of bed to chair tid with meals.   Continue to follow on inflammatory markers and oxygenation, Ok to transfer to telemetry.  Ruled out bacterial pneumonia/ sepsis, continue to hold on antibiotic therapy.   2. HTN/ CAD. Continue with carvedilol with good toleration. On asa 81 mg daily.   3. Dyslipidemia. Continue with pravastatin.   4. Steroid induced hyperglycemia. Will add insulin therapy for glucose control.   Patient continue to be at high risk for worsening respiratory failure.   Status is: Inpatient  Remains inpatient appropriate because:IV treatments appropriate due to intensity of illness or inability to take PO   Dispo: The patient is from: Home              Anticipated d/c is to: Home              Anticipated d/c date is: 3 days              Patient currently is not medically stable to d/c.   Difficult to place patient No   DVT prophylaxis: Enoxaparin   Code Status:   full  Family Communication:          Subjective: Patient continue to have significant dyspnea, on exertion, no nausea or vomiting, no chest pain,.   Objective: Vitals:   11/27/20 0400 11/27/20 0600 11/27/20 0800 11/27/20 0900  BP: 117/69 123/66 113/70   Pulse: 73 65 62 65  Resp: (!) 26 (!) 24 (!) 23 (!) 29  Temp: 97.6 F (36.4 C)  (!) 97.5 F (36.4 C)   TempSrc: Oral  Oral   SpO2: 92% 92% 91% (!) 88%  Weight:      Height:       No intake or output data in the 24 hours ending 11/27/20 0909 Filed Weights   11/26/20 1120  Weight: 94.3 kg    Examination:   General: deconditioned  Neurology: Awake and alert, non focal  E ENT: no pallor, no icterus, oral mucosa moist Cardiovascular: No JVD. S1-S2 present, rhythmic, no gallops, rubs, or murmurs. No lower  extremity edema. Pulmonary: positive breath sounds bilaterally, with no wheezing, rhonchi or rales. Gastrointestinal. Abdomen soft and non tender Skin. No rashes Musculoskeletal: no joint deformities     Data Reviewed: I have personally reviewed following labs and imaging studies  CBC: Recent Labs  Lab 11/24/20 1907 11/25/20 0232 11/26/20 0259 11/27/20 0259  WBC 6.5 4.6 8.5 11.5*  NEUTROABS 4.7 3.6 7.2 10.3*  HGB 15.1 14.2 14.3 14.3  HCT 45.4 43.4 44.7 43.8  MCV 81.1 82.4 83.1 83.3  PLT 228 192 228 252   Basic Metabolic Panel: Recent Labs  Lab 11/24/20 1907 11/25/20 0232 11/26/20 0259 11/27/20 0259  NA 133* 134* 137 136  K 4.1 4.1 4.5 4.6  CL 101 102 105 103  CO2 20* 21* 22 21*  GLUCOSE 107* 182* 229* 203*  BUN 22* 22* 26* 27*  CREATININE 1.06 0.95 0.83 0.87  CALCIUM 8.6* 8.6* 8.8* 8.6*  MG  --  2.1 2.3 2.3  PHOS  --  3.9 3.4 3.7   GFR: Estimated Creatinine Clearance: 101.9 mL/min (by C-G formula based on SCr of 0.87 mg/dL). Liver Function Tests: Recent Labs  Lab 11/24/20 1907 11/25/20 0232 11/26/20 0259 11/27/20 0259  AST 39 37 37 38  ALT 25 27 29  37  ALKPHOS 53 49 50 51  BILITOT 0.5 0.7 0.6 0.7  PROT 7.1 6.6 6.5 6.6  ALBUMIN 3.5 3.2* 3.1* 3.2*   No results for input(s): LIPASE, AMYLASE in the last 168 hours. No results for input(s): AMMONIA in the last 168 hours. Coagulation Profile: No results for input(s): INR, PROTIME in the last 168 hours. Cardiac Enzymes: No results for input(s): CKTOTAL, CKMB, CKMBINDEX, TROPONINI in the last 168 hours. BNP (last 3 results) No results for input(s): PROBNP in the last 8760 hours. HbA1C: No results for input(s): HGBA1C in the last 72 hours. CBG: No results for input(s): GLUCAP in the last 168 hours. Lipid Profile: Recent Labs    11/24/20 1917  TRIG 196*   Thyroid Function Tests: No results for input(s): TSH, T4TOTAL, FREET4, T3FREE, THYROIDAB in the last 72 hours. Anemia Panel: Recent Labs     11/26/20 0259 11/27/20 0259  FERRITIN 593* 607*      Radiology Studies: I have reviewed all of the imaging during this hospital visit personally     Scheduled Meds: . vitamin C  500 mg Oral Daily  . aspirin EC  81 mg Oral Daily  . carvedilol  3.125 mg Oral BID  . Chlorhexidine Gluconate Cloth  6 each Topical Daily  . enoxaparin (LOVENOX) injection  40 mg Subcutaneous Q24H  . Ipratropium-Albuterol  1 puff Inhalation Q6H  . mouth rinse  15 mL Mouth Rinse BID  . methylPREDNISolone (SOLU-MEDROL) injection  60 mg Intravenous Q12H  . pantoprazole  40 mg  Oral Daily  . pravastatin  40 mg Oral Daily  . sodium chloride flush  3 mL Intravenous Q12H  . zinc sulfate  220 mg Oral Daily   Continuous Infusions: . remdesivir 100 mg in NS 100 mL Stopped (11/26/20 0858)     LOS: 3 days        Preston Weill Annett Gula, MD

## 2020-11-27 NOTE — TOC Progression Note (Signed)
Transition of Care [facility]) - Progression Note    Patient Details  Name: Curtis Rosales MRN: [id number] Date of Birth: April 13, 1961  Transition of Care (TOC) CM/SW Contact  Leeroy Cha, RN Phone Number: 11/27/2020, 8:37 AM  Clinical Narrative:    Covid vaccination; unvaccinated   Active Problems:   Cardiomyopathy, ischemic   CAD (coronary artery disease)   Pure hypercholesterolemia   Acute respiratory disease due to COVID-19 virus   Acute respiratory failure with hypoxia ([place])   Sepsis due to pneumonia ([place])   Pneumonia due to COVID-19 virus  Sepsis due to pneumonia -Patient met criteria for sepsis HR> 90, RR> 20, Temp> 38 C, site of infection lungs  Acute respiratory failure with hypoxia/Covid pneumonia COVID-19 Labs PLAN: to return to home when stable Progression: now on nonrebreather mask at 15l/min, iv solumedrol, remdesivir [phone]  Expected Discharge Plan: Home/Self Care Barriers to Discharge: Continued Medical Work up  Expected Discharge Plan and Services Expected Discharge Plan: Home/Self Care   Discharge Planning Services: CM Consult   Living arrangements for the past 2 months: Single Family Home                                       Social Determinants of Health (SDOH) Interventions    Readmission Risk Interventions No flowsheet data found.

## 2020-11-28 ENCOUNTER — Inpatient Hospital Stay (HOSPITAL_COMMUNITY): Payer: 59

## 2020-11-28 DIAGNOSIS — I255 Ischemic cardiomyopathy: Secondary | ICD-10-CM

## 2020-11-28 LAB — COMPREHENSIVE METABOLIC PANEL
ALT: 72 U/L — ABNORMAL HIGH (ref 0–44)
AST: 60 U/L — ABNORMAL HIGH (ref 15–41)
Albumin: 3.1 g/dL — ABNORMAL LOW (ref 3.5–5.0)
Alkaline Phosphatase: 53 U/L (ref 38–126)
Anion gap: 10 (ref 5–15)
BUN: 26 mg/dL — ABNORMAL HIGH (ref 6–20)
CO2: 22 mmol/L (ref 22–32)
Calcium: 8.6 mg/dL — ABNORMAL LOW (ref 8.9–10.3)
Chloride: 104 mmol/L (ref 98–111)
Creatinine, Ser: 0.8 mg/dL (ref 0.61–1.24)
GFR, Estimated: >60 mL/min (ref >60)
Glucose, Bld: 218 mg/dL — ABNORMAL HIGH (ref 70–99)
Potassium: 4.3 mmol/L (ref 3.5–5.1)
Sodium: 136 mmol/L (ref 135–145)
Total Bilirubin: 0.9 mg/dL (ref 0.3–1.2)
Total Protein: 6.3 g/dL — ABNORMAL LOW (ref 6.5–8.1)

## 2020-11-28 LAB — GLUCOSE, CAPILLARY
Glucose-Capillary: 169 mg/dL — ABNORMAL HIGH (ref 70–99)
Glucose-Capillary: 283 mg/dL — ABNORMAL HIGH (ref 70–99)
Glucose-Capillary: 160 mg/dL — ABNORMAL HIGH (ref 70–99)
Glucose-Capillary: 152 mg/dL — ABNORMAL HIGH (ref 70–99)

## 2020-11-28 LAB — FERRITIN: Ferritin: 535 ng/mL — ABNORMAL HIGH (ref 24–336)

## 2020-11-28 LAB — D-DIMER, QUANTITATIVE: D-Dimer, Quant: 0.42 ug/mL-FEU (ref 0.00–0.50)

## 2020-11-28 LAB — C-REACTIVE PROTEIN: CRP: 0.6 mg/dL (ref <1.0)

## 2020-11-28 MED ORDER — ALBUTEROL SULFATE HFA 108 (90 BASE) MCG/ACT IN AERS: 1.0000 | INHALATION_SPRAY | RESPIRATORY_TRACT | Status: DC | PRN

## 2020-11-28 MED ORDER — METHYLPREDNISOLONE SODIUM SUCC 40 MG IJ SOLR
40.0000 mg | Freq: Every day | INTRAMUSCULAR | Status: DC
  Administered 2020-11-29 – 2020-11-30 (×2): 40 mg via INTRAVENOUS
  Filled 2020-11-28 (×2): qty 1

## 2020-11-28 MED ORDER — FUROSEMIDE 10 MG/ML IJ SOLN
40.0000 mg | Freq: Once | INTRAMUSCULAR | Status: AC
  Administered 2020-11-28: 40 mg via INTRAVENOUS
  Filled 2020-11-28: qty 4

## 2020-11-28 NOTE — Progress Notes (Signed)
PROGRESS NOTE    Curtis Rosales  OTL:[id number] DOB: 1961-09-19 DOA: 11/24/2020 PCP: Ambrose Mantle, MD    Brief Narrative:  Curtis Rosales was admitted to the hospital with a working diagnosis of acute hypoxic respiratory failure due to SARS COVID-19 viral pneumonia.  60 year old male unvaccinated for COVID-19 with past medical history for coronary artery disease, ischemic cardiomyopathy who tested positive for COVID-19 11/18/2020, his symptoms were consistent with body aches, cough and fevers, he received steroids as an outpatient with no improvement of his symptoms. On his initial physical examination his oxygenation was in the mid 80s with exertion, he was febrile 101.50F, respiratory rate 33, oxygen saturation 93% on supplemental oxygen, heart rate 102, blood pressure 137/86, his lungs had no wheezing or rails, heart S1-S2, present rhythmic, soft abdomen, no lower extremity edema. SARS COVID-19 positive,  Chest radiograph with faint interstitial infiltrates bilaterally upper lobes, predominantly on the right side. EKG 103 bpm, normal axis, normal intervals, sinus rhythm, no ST segment or T wave changes.   Assessment & Plan:   Principal Problem:   Pneumonia due to COVID-19 virus Active Problems:   Cardiomyopathy, ischemic   CAD (coronary artery disease)   Pure hypercholesterolemia   Acute respiratory disease due to COVID-19 virus   Acute respiratory failure with hypoxia (HCC)  Ruled out bacterial pneumonia/ sepsis.   1. Acute hypoxemic respiratory failure due to SARS COVId 19 viral pneumonia.  RR: 24  Pulse oxymetry: 87 to 93%  Fi02: 10 L/min per HFNC + 15 L/min  HFNC   COVID-19 Labs  Recent Labs    11/26/20 0259 11/27/20 0259 11/28/20 0341  DDIMER 0.43 0.43 0.42  FERRITIN 593* 607* 535*  CRP 0.8 0.6 0.6    Lab Results  Component Value Date   SARSCOV2NAA POSITIVE (A) 11/18/2020     Worsening oxygen requirements, now with high flow nasal cannula and non rebreather  mask.   Follow up chest radiograph with bilateral interstitial infiltrates.   Aggressive medical therapy with methylprednisolone, will decrease dose to 40 mg daily, considering low inflammatory markers. Continue with remdesivir per protocol. Holding on baricitinib due to low inflammatory markers.  Trial of furosemide 40 mg IV x1 for non cardiogenic pulmonary edema.   Supplemental 02 to keep oxygen saturation 88% or greater.   2. HTN/ CAD. Old records personally reviewed, echocardiogram from 2014 witrh preserved LV systolic function. Mild hypokinesis of the basal mid anterior septal myocardium.   Continue blood pressure control, if persistent acute pulmonary edema, will order echocardiogram.  Continue aspirin and statin therapy.   3. Dyslipidemia. On pravastatin.   4. Steroid induced hyperglycemia. Fasting glucose 218, capillary 172, 169, 283. Continue with insulin sliding scale, if persistent hyperglycemia, will add basal and prandial insulin.     Patient continue to be at high risk for worsening respiratory failure.   Status is: Inpatient  Remains inpatient appropriate because:IV treatments appropriate due to intensity of illness or inability to take PO   Dispo: The patient is from: Home              Anticipated d/c is to: Home              Anticipated d/c date is: 3 days              Patient currently is not medically stable to d/c.   Difficult to place patient No   DVT prophylaxis: Enoxaparin   Code Status:   full  Family Communication:  Subjective: Patient continue to have dyspnea at rest and on exertion, no nausea or vomiting, no chest pain.   Objective: Vitals:   11/27/20 2049 11/28/20 0443 11/28/20 1024 11/28/20 1055  BP: 127/71 120/77 117/69   Pulse: 68 67 70   Resp: (!) 23 (!) 24    Temp: 98.5 F (36.9 C) 97.8 F (36.6 C)    TempSrc: Oral Oral    SpO2: 92% (!) 87%  93%  Weight:      Height:        Intake/Output Summary (Last 24 hours)  at 11/28/2020 1400 Last data filed at 11/28/2020 0600 Gross per 24 hour  Intake 240 ml  Output 450 ml  Net -210 ml   Filed Weights   11/26/20 1120  Weight: 94.3 kg    Examination:   General: positive dyspnea at rest.  Neurology: Awake and alert, non focal  E ENT: mild pallor, no icterus, oral mucosa moist Cardiovascular: No JVD. S1-S2 present, rhythmic, no gallops, rubs, or murmurs. No lower extremity edema. Pulmonary: positive breath sounds bilaterally, with no wheezing.  Gastrointestinal. Abdomen soft and non tender Skin. No rashes Musculoskeletal: no joint deformities     Data Reviewed: I have personally reviewed following labs and imaging studies  CBC: Recent Labs  Lab 11/24/20 1907 11/25/20 0232 11/26/20 0259 11/27/20 0259  WBC 6.5 4.6 8.5 11.5*  NEUTROABS 4.7 3.6 7.2 10.3*  HGB 15.1 14.2 14.3 14.3  HCT 45.4 43.4 44.7 43.8  MCV 81.1 82.4 83.1 83.3  PLT 228 192 228 252   Basic Metabolic Panel: Recent Labs  Lab 11/24/20 1907 11/25/20 0232 11/26/20 0259 11/27/20 0259 11/28/20 0341  NA 133* 134* 137 136 136  K 4.1 4.1 4.5 4.6 4.3  CL 101 102 105 103 104  CO2 20* 21* 22 21* 22  GLUCOSE 107* 182* 229* 203* 218*  BUN 22* 22* 26* 27* 26*  CREATININE 1.06 0.95 0.83 0.87 0.80  CALCIUM 8.6* 8.6* 8.8* 8.6* 8.6*  MG  --  2.1 2.3 2.3  --   PHOS  --  3.9 3.4 3.7  --    GFR: Estimated Creatinine Clearance: 110.8 mL/min (by C-G formula based on SCr of 0.8 mg/dL). Liver Function Tests: Recent Labs  Lab 11/24/20 1907 11/25/20 0232 11/26/20 0259 11/27/20 0259 11/28/20 0341  AST 39 37 37 38 60*  ALT 25 27 29  37 72*  ALKPHOS 53 49 50 51 53  BILITOT 0.5 0.7 0.6 0.7 0.9  PROT 7.1 6.6 6.5 6.6 6.3*  ALBUMIN 3.5 3.2* 3.1* 3.2* 3.1*   No results for input(s): LIPASE, AMYLASE in the last 168 hours. No results for input(s): AMMONIA in the last 168 hours. Coagulation Profile: No results for input(s): INR, PROTIME in the last 168 hours. Cardiac Enzymes: No  results for input(s): CKTOTAL, CKMB, CKMBINDEX, TROPONINI in the last 168 hours. BNP (last 3 results) No results for input(s): PROBNP in the last 8760 hours. HbA1C: Recent Labs    11/27/20 0259  HGBA1C 6.8*   CBG: Recent Labs  Lab 11/27/20 1204 11/27/20 1804 11/27/20 2125 11/28/20 0810 11/28/20 1141  GLUCAP 224* 219* 172* 169* 283*   Lipid Profile: No results for input(s): CHOL, HDL, LDLCALC, TRIG, CHOLHDL, LDLDIRECT in the last 72 hours. Thyroid Function Tests: No results for input(s): TSH, T4TOTAL, FREET4, T3FREE, THYROIDAB in the last 72 hours. Anemia Panel: Recent Labs    11/27/20 0259 11/28/20 0341  FERRITIN 607* 535*      Radiology Studies: I have reviewed  all of the imaging during this hospital visit personally     Scheduled Meds: . vitamin C  500 mg Oral Daily  . aspirin EC  81 mg Oral Daily  . carvedilol  3.125 mg Oral BID  . Chlorhexidine Gluconate Cloth  6 each Topical Daily  . enoxaparin (LOVENOX) injection  40 mg Subcutaneous Q24H  . insulin aspart  0-9 Units Subcutaneous TID WC  . Ipratropium-Albuterol  1 puff Inhalation Q6H  . mouth rinse  15 mL Mouth Rinse BID  . methylPREDNISolone (SOLU-MEDROL) injection  60 mg Intravenous Q12H  . pantoprazole  40 mg Oral Daily  . pravastatin  40 mg Oral Daily  . sodium chloride flush  3 mL Intravenous Q12H  . zinc sulfate  220 mg Oral Daily   Continuous Infusions:   LOS: 4 days        Belva Koziel Annett Gula, MD

## 2020-11-28 NOTE — Evaluation (Signed)
Occupational Therapy Evaluation Patient Details Name: Curtis Rosales MRN: [id number] DOB: [birth year] Today's Date: 11/28/2020    History of Present Illness 60 year old male unvaccinated for COVID-19 with past medical history for coronary artery disease, ischemic cardiomyopathy who tested positive for COVID-19 11/18/2020. Now admitted with diagnosis of COVID pneumonia.   Clinical Impression   Mr. Amen Staszak is a 60 year old man admitted to hospital with COVID pneumonia. 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 On evaluation patient demonstrates functional ROM and strength of upper and lower extremities and demonstrates ability to perform ADLs/activity in seated position with set up. Patient min guard for standing and transfer to recliner. Patient limited by impaired cardiopulmonary status and poor activity tolerance and currently on 10 L HFNC and 15 L NRB. Patient educated on use of breathing techniques, breathing devices, prone positioning and mobility for recovery. Patient verbalized understanding. Patient will benefit from skilled OT services to improve activity tolerance and cardiopulmonary status  to reduce oxygenation needs in order for patient to return home at discharge.      Follow Up Recommendations  No OT follow up    Equipment Recommendations  None recommended by OT    Recommendations for Other Services       Precautions / Restrictions Precautions Precautions: Other (comment) Precaution Comments: Monitor Sats. Restrictions Weight Bearing Restrictions: No      Mobility Bed Mobility Overal bed mobility: Needs Assistance Bed Mobility: Supine to Sit     Supine to sit: Modified independent (Device/Increase time)          Transfers Overall transfer level: Needs assistance Equipment used: None Transfers: Sit to/from [employer] Sit to Stand: Min guard Stand pivot transfers: Min guard       General transfer comment: assistance for  lines/leads    Balance Overall balance assessment: No apparent balance deficits (not formally assessed)                                         ADL either performed or assessed with clinical judgement   ADL Overall ADL's : Needs assistance/impaired Eating/Feeding: Independent   Grooming: Set up;Sitting   Upper Body Bathing: Set up;Sitting   Lower Body Bathing: Min guard;Sit to/from stand;Set up   Upper Body Dressing : Set up;Sitting   Lower Body Dressing: Set up;Sit to/from stand;Min guard   Toilet Transfer: Min Therapist, music and Hygiene: Min guard;Sit to/from stand               Vision Patient Visual Report: No change from baseline       Perception     Praxis      Pertinent Vitals/Pain Pain Assessment: No/denies pain     Hand Dominance Right   Extremity/Trunk Assessment Upper Extremity Assessment Upper Extremity Assessment: Overall WFL for tasks assessed   Lower Extremity Assessment Lower Extremity Assessment: Overall WFL for tasks assessed   Cervical / Trunk Assessment Cervical / Trunk Assessment: Normal   Communication Communication Communication: No difficulties   Cognition Arousal/Alertness: Awake/alert Behavior During Therapy: WFL for tasks assessed/performed Overall Cognitive Status: Within Functional Limits for tasks assessed                                     General Comments  Exercises     Shoulder Instructions      Home Living Family/patient expects to be discharged to:: Private residence Living Arrangements: Spouse/significant other Available Help at Discharge: Family Type of Home: House Home Access: Stairs to enter Secretary/administrator of Steps: 4 Entrance Stairs-Rails: Left Home Layout: One level     Bathroom Shower/Tub: Producer, television/film/video: Standard     Home Equipment: Shower seat - built in          Prior  Functioning/Environment Level of Independence: Independent        Comments: Journalist, newspaper        OT Problem List: Decreased activity tolerance;Cardiopulmonary status limiting activity      OT Treatment/Interventions: Self-care/ADL training;Therapeutic exercise;Energy conservation;DME and/or AE instruction;Patient/family education;Therapeutic activities    OT Goals(Current goals can be found in the care plan section) Acute Rehab OT Goals Patient Stated Goal: To get off oxygen OT Goal Formulation: With patient Time For Goal Achievement: 12/12/20 Potential to Achieve Goals: Good  OT Frequency: Min 2X/week   Barriers to D/C:            Co-evaluation              AM-PAC OT "6 Clicks" Daily Activity     Outcome Measure Help from another person eating meals?: None Help from another person taking care of personal grooming?: A Little Help from another person toileting, which includes using toliet, bedpan, or urinal?: A Little Help from another person bathing (including washing, rinsing, drying)?: A Little Help from another person to put on and taking off regular upper body clothing?: A Little Help from another person to put on and taking off regular lower body clothing?: A Little 6 Click Score: 19   End of Session Equipment Utilized During Treatment: Oxygen Nurse Communication: Mobility status  Activity Tolerance: Patient limited by fatigue Patient left: in chair;with call bell/phone within reach  OT Visit Diagnosis: Other (comment) (COVID pna)                Time: 8469-6295 OT Time Calculation (min): 18 min Charges:  OT General Charges $OT Visit: 1 Visit OT Evaluation $OT Eval Low Complexity: 1 Low  Shajuana Mclucas, OTR/L Acute Care Rehab Services  Office [phone] Pager: [phone]   Kelli Churn 11/28/2020, 10:47 AM

## 2020-11-28 NOTE — Evaluation (Signed)
Physical Therapy Evaluation Patient Details Name: Curtis Rosales MRN: [id number] DOB: May 10, 1961 Today's Date: 11/28/2020   History of Present Illness  Pt is 60 year old male unvaccinated for COVID-19 with past medical history for coronary artery disease, ischemic cardiomyopathy who tested positive for COVID-19 11/18/2020. Now admitted with diagnosis of COVID pneumonia.  Clinical Impression  Pt admitted with above diagnosis. Pt presents with deficits in cardiopulmonary endurance and activity tolerance requiring NRB and HFNC oxygen.  He was able to ambulate 30'x2.  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 Expected to progress well with mobility if respiratory status improves.  Pt currently with functional limitations due to the deficits listed below (see PT Problem List). Pt will benefit from skilled PT to increase their independence and safety with mobility to allow discharge to the venue listed below.       Follow Up Recommendations Home health PT;Supervision for mobility/OOB    Equipment Recommendations   (possible home O2)    Recommendations for Other Services       Precautions / Restrictions Precautions Precautions: Other (comment) Precaution Comments: Monitor Sats. Restrictions Weight Bearing Restrictions: No      Mobility  Bed Mobility Overal bed mobility: Needs Assistance Bed Mobility: Supine to Sit     Supine to sit: Modified independent (Device/Increase time)     General bed mobility comments: In chair at arrival; was Mod I with OT    Transfers Overall transfer level: Needs assistance Equipment used: None Transfers: Sit to/from Stand Sit to Stand: Supervision Stand pivot transfers: Min guard       General transfer comment: Performed x 3; supervision for lines  Ambulation/Gait Ambulation/Gait assistance: Min guard Gait Distance (Feet): 30 Feet (30'x2) Assistive device: None Gait Pattern/deviations: Step-through pattern Gait velocity: decreased    General Gait Details: Marched in place 1 min on 10 L NRB and 15L HFNC with sats stable.  Then ambulated 30'x2 with seated rest break on 15L NRB. Min guard for lines.  See general comments for VS  Stairs            Wheelchair Mobility    Modified Rankin (Stroke Patients Only)       Balance Overall balance assessment: Independent   Sitting balance-Leahy Scale: Normal       Standing balance-Leahy Scale: Good                               Pertinent Vitals/Pain Pain Assessment: No/denies pain    Home Living Family/patient expects to be discharged to:: Private residence Living Arrangements: Spouse/significant other Available Help at Discharge: Family Type of Home: House Home Access: Stairs to enter Entrance Stairs-Rails: Left Entrance Stairs-Number of Steps: 4 Home Layout: One level Home Equipment: Shower seat - built in      Prior Function Level of Independence: Independent         Comments: Administrator Dominance   Dominant Hand: Right    Extremity/Trunk Assessment   Upper Extremity Assessment Upper Extremity Assessment: Overall WFL for tasks assessed    Lower Extremity Assessment Lower Extremity Assessment: Overall WFL for tasks assessed    Cervical / Trunk Assessment Cervical / Trunk Assessment: Normal  Communication   Communication: No difficulties  Cognition Arousal/Alertness: Awake/alert Behavior During Therapy: WFL for tasks assessed/performed Overall Cognitive Status: Within Functional Limits for tasks assessed  General Comments General comments (skin integrity, edema, etc.): Pt on 15 L HFNC and 10 L NRB at rest with sats 94%.  Marched in place on same amount O2 and sats 94%.  Ambulated on 15L NRB and sats down to 91% with DOE of 3/4.  Once recovered able to return to resting level of oxygen with stable sats.    Exercises Other Exercises Other Exercises:  Flutter x 3; IS x 3 up to 1400 mL with cues for correct use   Assessment/Plan    PT Assessment Patient needs continued PT services  PT Problem List Decreased mobility;Decreased activity tolerance;Cardiopulmonary status limiting activity;Decreased knowledge of use of DME       PT Treatment Interventions DME instruction;Therapeutic activities;Gait training;Therapeutic exercise;Patient/family education;Stair training;Functional mobility training    PT Goals (Current goals can be found in the Care Plan section)  Acute Rehab PT Goals Patient Stated Goal: To get off oxygen PT Goal Formulation: With patient Time For Goal Achievement: 12/12/20 Potential to Achieve Goals: Good    Frequency Min 3X/week   Barriers to discharge        Co-evaluation               AM-PAC PT "6 Clicks" Mobility  Outcome Measure Help needed turning from your back to your side while in a flat bed without using bedrails?: None Help needed moving from lying on your back to sitting on the side of a flat bed without using bedrails?: None Help needed moving to and from a bed to a chair (including a wheelchair)?: A Little Help needed standing up from a chair using your arms (e.g., wheelchair or bedside chair)?: A Little Help needed to walk in hospital room?: A Little Help needed climbing 3-5 steps with a railing? : A Little 6 Click Score: 20    End of Session Equipment Utilized During Treatment: Oxygen Activity Tolerance: Patient tolerated treatment well Patient left: in chair;with call bell/phone within reach Nurse Communication: Mobility status PT Visit Diagnosis: Difficulty in walking, not elsewhere classified (R26.2)    Time: 8921-1941 PT Time Calculation (min) (ACUTE ONLY): 17 min   Charges:   PT Evaluation $PT Eval Moderate Complexity: 1 Mod          Alonza Knisley, PT Acute Rehab Services Pager [phone] [facility] [phone]    Rayetta Humphrey 11/28/2020, 1:58 PM

## 2020-11-28 NOTE — Plan of Care (Signed)
  Problem: Pain Managment: Goal: General experience of comfort will improve Outcome: Progressing   Problem: Safety: Goal: Ability to remain free from injury will improve Outcome: Progressing   Problem: Skin Integrity: Goal: Risk for impaired skin integrity will decrease Outcome: Progressing   

## 2020-11-29 LAB — CULTURE, BLOOD (ROUTINE X 2)
Culture: NO GROWTH
Culture: NO GROWTH
Special Requests: ADEQUATE
Special Requests: ADEQUATE

## 2020-11-29 LAB — GLUCOSE, CAPILLARY
Glucose-Capillary: 129 mg/dL — ABNORMAL HIGH (ref 70–99)
Glucose-Capillary: 197 mg/dL — ABNORMAL HIGH (ref 70–99)
Glucose-Capillary: 192 mg/dL — ABNORMAL HIGH (ref 70–99)
Glucose-Capillary: 195 mg/dL — ABNORMAL HIGH (ref 70–99)

## 2020-11-29 LAB — COMPREHENSIVE METABOLIC PANEL
ALT: 61 U/L — ABNORMAL HIGH (ref 0–44)
AST: 30 U/L (ref 15–41)
Albumin: 3.2 g/dL — ABNORMAL LOW (ref 3.5–5.0)
Alkaline Phosphatase: 54 U/L (ref 38–126)
Anion gap: 10 (ref 5–15)
BUN: 26 mg/dL — ABNORMAL HIGH (ref 6–20)
CO2: 25 mmol/L (ref 22–32)
Calcium: 8.7 mg/dL — ABNORMAL LOW (ref 8.9–10.3)
Chloride: 103 mmol/L (ref 98–111)
Creatinine, Ser: 0.88 mg/dL (ref 0.61–1.24)
GFR, Estimated: >60 mL/min (ref >60)
Glucose, Bld: 147 mg/dL — ABNORMAL HIGH (ref 70–99)
Potassium: 4.2 mmol/L (ref 3.5–5.1)
Sodium: 138 mmol/L (ref 135–145)
Total Bilirubin: 1 mg/dL (ref 0.3–1.2)
Total Protein: 6.5 g/dL (ref 6.5–8.1)

## 2020-11-29 LAB — C-REACTIVE PROTEIN: CRP: 0.7 mg/dL (ref <1.0)

## 2020-11-29 LAB — D-DIMER, QUANTITATIVE: D-Dimer, Quant: 0.46 ug/mL-FEU (ref 0.00–0.50)

## 2020-11-29 LAB — FERRITIN: Ferritin: 486 ng/mL — ABNORMAL HIGH (ref 24–336)

## 2020-11-29 MED ORDER — IPRATROPIUM-ALBUTEROL 20-100 MCG/ACT IN AERS
1.0000 | INHALATION_SPRAY | Freq: Four times a day (QID) | RESPIRATORY_TRACT | Status: DC
  Administered 2020-11-29 – 2020-12-07 (×36): 1 via RESPIRATORY_TRACT

## 2020-11-29 MED ORDER — FUROSEMIDE 10 MG/ML IJ SOLN
40.0000 mg | Freq: Once | INTRAMUSCULAR | Status: AC
  Administered 2020-11-29: 40 mg via INTRAVENOUS
  Filled 2020-11-29: qty 4

## 2020-11-29 NOTE — Progress Notes (Signed)
PROGRESS NOTE    Curtis Rosales  PZW:[id number] DOB: August 07, 1961 DOA: 11/24/2020 PCP: Ambrose Mantle, MD    Brief Narrative:  Curtis Rosales was admitted to the hospital with a working diagnosis of acute hypoxic respiratory failure due to SARS COVID-19 viral pneumonia.  60 year old male unvaccinated for COVID-19 with past medical history for coronary artery disease, ischemic cardiomyopathy who tested positive for COVID-19 11/18/2020, his symptoms were consistent with body aches, cough and fevers, he received steroids as an outpatient with no improvement of his symptoms. On his initial physical examination his oxygenation was in the mid 80s with exertion, he was febrile 101.33F, respiratoryrate 33, oxygen saturation93% on supplemental oxygen, heart rate 102,blood pressure 137/86,his lungs had no wheezing or rails, heart S1-S2, present rhythmic, soft abdomen, no lower extremity edema. SARS COVID-19 positive,  Chest radiograph with faint interstitial infiltrates bilaterally upper lobes, predominantly on the right side. EKG 103 bpm, normal axis, normal intervals, sinus rhythm, no ST segment or T wave changes.  Patient with improving inflammatory markers but continue to have high oxygen requirements.   Trial of diuresis for non cardiogenic pulmonary edema.    Assessment & Plan:   Principal Problem:   Pneumonia due to COVID-19 virus Active Problems:   Cardiomyopathy, ischemic   CAD (coronary artery disease)   Pure hypercholesterolemia   Acute respiratory disease due to COVID-19 virus   Acute respiratory failure with hypoxia (HCC)  Ruled out bacterial pneumonia/ sepsis.    1. Acute hypoxemic respiratory failure due to SARS COVId 19 viral pneumonia.  RR: Pulse oxymetry: 88 to 90%  Fi02: 15 L HFNC plus non rebreather mask.  COVID-19 Labs  Recent Labs    11/27/20 0259 11/28/20 0341 11/29/20 0355  DDIMER 0.43 0.42 0.46  FERRITIN 607* 535* 486*  CRP 0.6 0.6 0.7    Lab Results   Component Value Date   SARSCOV2NAA POSITIVE (A) 11/18/2020   01/27 chest radiograph with bilateral interstitial infiltrates.   Patient continue to have very high oxygen requirements, he had furosemide yesterday with urine output documented 650 ml.  At times can be off non rebreather but not able to wean off completely.  At the time of my examination off non rebreather mask he desaturated from 97% to 88% rapidly.   Continue medical therapy with methylprednisolone 40 mg daily.  Repeat dose of furosemide, monitor urine output. Suspected non cardiogenic pulmonary edema.   Continue to encourage out of bed to chair tid with meals, PT and OT for mobility.   Target oxygen saturation 88% or greater.   2. HTN/ CAD. Echocardiogram from 2014 witrh preserved LV systolic function. Mild hypokinesis of the basal mid anterior septal myocardium.   On statin and aspirin.   3. Dyslipidemia. Continue with pravastatin.  4. Steroid induced hyperglycemia. Fasting glucose 147, capillary 129 and 197, steroid dose has been decreased.  Patient continue to be at high risk for worsening respiratory failure   Status is: Inpatient  Remains inpatient appropriate because:IV treatments appropriate due to intensity of illness or inability to take PO   Dispo: The patient is from: Home              Anticipated d/c is to: Home              Anticipated d/c date is: 3 days              Patient currently is not medically stable to d/c.   Difficult to place patient No   DVT prophylaxis:  Enoxaparin   Code Status:   full  Family Communication:  I spoke over the phone with the patient's wife about patient's  condition, plan of care, prognosis and all questions were addressed.   Subjective: Patient continue to have dyspnea with minimal efforts, high oxygen requirements, no nausea or vomiting.   Objective: Vitals:   11/29/20 0513 11/29/20 0700 11/29/20 0821 11/29/20 0822  BP: 125/70  124/70   Pulse: 79   82   Resp: 20     Temp: 98.9 F (37.2 C)     TempSrc:      SpO2: 91% 97% 90% (!) 88%  Weight:      Height:        Intake/Output Summary (Last 24 hours) at 11/29/2020 1146 Last data filed at 11/29/2020 0115 Gross per 24 hour  Intake --  Output 650 ml  Net -650 ml   Filed Weights   11/26/20 1120  Weight: 94.3 kg    Examination:   General: Deconditioned and dyspnea at rest.  Neurology: Awake and alert, non focal  E ENT: no pallor, no icterus, oral mucosa moist Cardiovascular: No JVD. S1-S2 present, rhythmic, no gallops, rubs, or murmurs. No lower extremity edema. Pulmonary: positive breath sounds bilaterally, no wheezing Gastrointestinal. Abdomen soft and non tender Skin. No rashes Musculoskeletal: no joint deformities     Data Reviewed: I have personally reviewed following labs and imaging studies  CBC: Recent Labs  Lab 11/24/20 1907 11/25/20 0232 11/26/20 0259 11/27/20 0259  WBC 6.5 4.6 8.5 11.5*  NEUTROABS 4.7 3.6 7.2 10.3*  HGB 15.1 14.2 14.3 14.3  HCT 45.4 43.4 44.7 43.8  MCV 81.1 82.4 83.1 83.3  PLT 228 192 228 252   Basic Metabolic Panel: Recent Labs  Lab 11/25/20 0232 11/26/20 0259 11/27/20 0259 11/28/20 0341 11/29/20 0355  NA 134* 137 136 136 138  K 4.1 4.5 4.6 4.3 4.2  CL 102 105 103 104 103  CO2 21* 22 21* 22 25  GLUCOSE 182* 229* 203* 218* 147*  BUN 22* 26* 27* 26* 26*  CREATININE 0.95 0.83 0.87 0.80 0.88  CALCIUM 8.6* 8.8* 8.6* 8.6* 8.7*  MG 2.1 2.3 2.3  --   --   PHOS 3.9 3.4 3.7  --   --    GFR: Estimated Creatinine Clearance: 100.7 mL/min (by C-G formula based on SCr of 0.88 mg/dL). Liver Function Tests: Recent Labs  Lab 11/25/20 0232 11/26/20 0259 11/27/20 0259 11/28/20 0341 11/29/20 0355  AST 37 37 38 60* 30  ALT 27 29 37 72* 61*  ALKPHOS 49 50 51 53 54  BILITOT 0.7 0.6 0.7 0.9 1.0  PROT 6.6 6.5 6.6 6.3* 6.5  ALBUMIN 3.2* 3.1* 3.2* 3.1* 3.2*   No results for input(s): LIPASE, AMYLASE in the last 168 hours. No  results for input(s): AMMONIA in the last 168 hours. Coagulation Profile: No results for input(s): INR, PROTIME in the last 168 hours. Cardiac Enzymes: No results for input(s): CKTOTAL, CKMB, CKMBINDEX, TROPONINI in the last 168 hours. BNP (last 3 results) No results for input(s): PROBNP in the last 8760 hours. HbA1C: Recent Labs    11/27/20 0259  HGBA1C 6.8*   CBG: Recent Labs  Lab 11/28/20 0810 11/28/20 1141 11/28/20 1723 11/28/20 2120 11/29/20 0737  GLUCAP 169* 283* 160* 152* 129*   Lipid Profile: No results for input(s): CHOL, HDL, LDLCALC, TRIG, CHOLHDL, LDLDIRECT in the last 72 hours. Thyroid Function Tests: No results for input(s): TSH, T4TOTAL, FREET4, T3FREE, THYROIDAB in the last  72 hours. Anemia Panel: Recent Labs    11/28/20 0341 11/29/20 0355  FERRITIN 535* 486*      Radiology Studies: I have reviewed all of the imaging during this hospital visit personally     Scheduled Meds: . vitamin C  500 mg Oral Daily  . aspirin EC  81 mg Oral Daily  . carvedilol  3.125 mg Oral BID  . Chlorhexidine Gluconate Cloth  6 each Topical Daily  . enoxaparin (LOVENOX) injection  40 mg Subcutaneous Q24H  . insulin aspart  0-9 Units Subcutaneous TID WC  . Ipratropium-Albuterol  1 puff Inhalation QID  . mouth rinse  15 mL Mouth Rinse BID  . methylPREDNISolone (SOLU-MEDROL) injection  40 mg Intravenous Daily  . pantoprazole  40 mg Oral Daily  . pravastatin  40 mg Oral Daily  . sodium chloride flush  3 mL Intravenous Q12H  . zinc sulfate  220 mg Oral Daily   Continuous Infusions:   LOS: 5 days        Curtis Rosales Annett Gula, MD

## 2020-11-30 LAB — C-REACTIVE PROTEIN: CRP: 7.9 mg/dL — ABNORMAL HIGH (ref <1.0)

## 2020-11-30 LAB — COMPREHENSIVE METABOLIC PANEL
ALT: 47 U/L — ABNORMAL HIGH (ref 0–44)
AST: 21 U/L (ref 15–41)
Albumin: 3.2 g/dL — ABNORMAL LOW (ref 3.5–5.0)
Alkaline Phosphatase: 53 U/L (ref 38–126)
Anion gap: 11 (ref 5–15)
BUN: 29 mg/dL — ABNORMAL HIGH (ref 6–20)
CO2: 26 mmol/L (ref 22–32)
Calcium: 8.7 mg/dL — ABNORMAL LOW (ref 8.9–10.3)
Chloride: 101 mmol/L (ref 98–111)
Creatinine, Ser: 0.75 mg/dL (ref 0.61–1.24)
GFR, Estimated: >60 mL/min (ref >60)
Glucose, Bld: 147 mg/dL — ABNORMAL HIGH (ref 70–99)
Potassium: 4 mmol/L (ref 3.5–5.1)
Sodium: 138 mmol/L (ref 135–145)
Total Bilirubin: 1.2 mg/dL (ref 0.3–1.2)
Total Protein: 6.5 g/dL (ref 6.5–8.1)

## 2020-11-30 LAB — GLUCOSE, CAPILLARY
Glucose-Capillary: 133 mg/dL — ABNORMAL HIGH (ref 70–99)
Glucose-Capillary: 159 mg/dL — ABNORMAL HIGH (ref 70–99)
Glucose-Capillary: 220 mg/dL — ABNORMAL HIGH (ref 70–99)
Glucose-Capillary: 203 mg/dL — ABNORMAL HIGH (ref 70–99)

## 2020-11-30 LAB — FERRITIN: Ferritin: 513 ng/mL — ABNORMAL HIGH (ref 24–336)

## 2020-11-30 LAB — D-DIMER, QUANTITATIVE: D-Dimer, Quant: 0.63 ug/mL-FEU — ABNORMAL HIGH (ref 0.00–0.50)

## 2020-11-30 MED ORDER — HYDROCOD POLST-CPM POLST ER 10-8 MG/5ML PO SUER
5.0000 mL | Freq: Two times a day (BID) | ORAL | Status: DC
  Administered 2020-11-30 – 2020-12-14 (×29): 5 mL via ORAL
  Filled 2020-11-30 (×30): qty 5

## 2020-11-30 MED ORDER — METHYLPREDNISOLONE SODIUM SUCC 40 MG IJ SOLR
40.0000 mg | Freq: Two times a day (BID) | INTRAMUSCULAR | Status: DC
  Administered 2020-11-30 – 2020-12-01 (×2): 40 mg via INTRAVENOUS
  Filled 2020-11-30 (×2): qty 1

## 2020-11-30 NOTE — Progress Notes (Signed)
Pt is on 15L HFNC with O2 sats maintained about 85-88%. Any activity drops down to 77-79%. Once NRB is added he gradually returns to 88-89%. Educated on the importance of resting prone or at least on his side. Also educated on how the use of the IS & flutter can help his lungs. Return demonstration of proper use

## 2020-11-30 NOTE — Progress Notes (Signed)
PROGRESS NOTE    Irineo Gaulin  ZOX:[id number] DOB: 1961/07/31 DOA: 11/24/2020 PCP: Ambrose Mantle, MD    Brief Narrative:  Mr. Ramdass was admitted to the hospital with a working diagnosis of acute hypoxic respiratory failure due to SARS COVID-19 viral pneumonia.  60 year old male unvaccinated for COVID-19 with past medical history for coronary artery disease, ischemic cardiomyopathy who tested positive for COVID-19 11/18/2020, his symptoms were consistent with body aches, cough and fevers, he received steroids as an outpatient with no improvement of his symptoms. On his initial physical examination his oxygenation was in the mid 80s with exertion, he was febrile 101.20F, respiratoryrate 33, oxygen saturation93% on supplemental oxygen, heart rate 102,blood pressure 137/86,his lungs had no wheezing or rails, heart S1-S2, present rhythmic, soft abdomen, no lower extremity edema. SARS COVID-19 positive,  Chest radiograph with faint interstitial infiltrates bilaterally upper lobes, predominantly on the right side. EKG 103 bpm, normal axis, normal intervals, sinus rhythm, no ST segment or T wave changes.  Placed on aggressive medical therapy with systemic steroids and remdesivr.   Trial of diuresis for non cardiogenic pulmonary edema.   Continue to have high oxygen requirements, using non rebreather mask and high flow nasal cannula.   Assessment & Plan:   Principal Problem:   Pneumonia due to COVID-19 virus Active Problems:   Cardiomyopathy, ischemic   CAD (coronary artery disease)   Pure hypercholesterolemia   Acute respiratory disease due to COVID-19 virus   Acute respiratory failure with hypoxia (HCC)   1. Acute hypoxemic respiratory failure due to SARS COVId 19 viral pneumonia.  RR: 24  Pulse oxymetry: 92%  Fi02: 15 L/min per [place]/ NRBM    COVID-19 Labs  Recent Labs    11/28/20 0341 11/29/20 0355 11/30/20 0348  DDIMER 0.42 0.46 0.63*  FERRITIN 535* 486* 513*  CRP  0.6 0.7 7.9*    Lab Results  Component Value Date   SARSCOV2NAA POSITIVE (A) 11/18/2020   01/27 chest radiograph with bilateral interstitial infiltrates.  Persistent hypoxemic respiratory failure, using non rebreather mask and high flow nasal cannula.  Worsening inflammatory markers.   Increase methylprednisolone 40 mg to bid.  Continue with bronchodilator therapy, antitussive agents and airway clearing techniques.  Out of bed to chair tid with meals.   If persistent respiratory failure will plan to do CT chest with PE protocol in 48 hrs.   2. HTN/ CAD.Echocardiogram from 2014 witrh preserved LV systolic function.Mild hypokinesis of the basal mid anterior septal myocardium.   No active chest pain, continue with statin and aspirin.   3. Dyslipidemia.Onpravastatin.  4. Steroid induced hyperglycemia. Stable fasting glucose at 147, continue with insulin sliding scale for glucose cover and monitoring.   Patient continue to be at high risk for worsening respiratory failure   Status is: Inpatient  Remains inpatient appropriate because:IV treatments appropriate due to intensity of illness or inability to take PO   Dispo: The patient is from: Home              Anticipated d/c is to: Home              Anticipated d/c date is: > 3 days              Patient currently is not medically stable to d/c.   Difficult to place patient No   DVT prophylaxis: Enoxaparin   Code Status:   full  Family Communication:  I spoke over the phone with the patient's wife about patient's  condition, plan  of care, prognosis and all questions were addressed.    Subjective: Patient reports feeling better, but on examination continue to e very weak and deconditioned. Per nursing report poor oral intake. He has been using non re-breather mask and high flow nasal cannula.   Objective: Vitals:   11/29/20 1439 11/29/20 2031 11/30/20 0100 11/30/20 0434  BP: 107/79 119/73  127/71  Pulse: 76 70   79  Resp: 20 (!) 24  (!) 24  Temp: 98.1 F (36.7 C) 98.2 F (36.8 C)  98.6 F (37 C)  TempSrc: Oral Oral  Oral  SpO2: (!) 85% (!) 89% 94% 92%  Weight:      Height:        Intake/Output Summary (Last 24 hours) at 11/30/2020 0836 Last data filed at 11/29/2020 2020 Gross per 24 hour  Intake 120 ml  Output --  Net 120 ml   Filed Weights   11/26/20 1120  Weight: 94.3 kg    Examination:   General: deconditioned and ill looking appearing  Neurology: Awake and alert, non focal  E ENT: mild pallor, no icterus, oral mucosa moist Cardiovascular: No JVD. S1-S2 present, rhythmic, no gallops, rubs, or murmurs. No lower extremity edema. Pulmonary: positive breath sounds bilaterally, poor air movement.  Gastrointestinal. Abdomen soft and non tender Skin. No rashes Musculoskeletal: no joint deformities     Data Reviewed: I have personally reviewed following labs and imaging studies  CBC: Recent Labs  Lab 11/24/20 1907 11/25/20 0232 11/26/20 0259 11/27/20 0259  WBC 6.5 4.6 8.5 11.5*  NEUTROABS 4.7 3.6 7.2 10.3*  HGB 15.1 14.2 14.3 14.3  HCT 45.4 43.4 44.7 43.8  MCV 81.1 82.4 83.1 83.3  PLT 228 192 228 252   Basic Metabolic Panel: Recent Labs  Lab 11/25/20 0232 11/26/20 0259 11/27/20 0259 11/28/20 0341 11/29/20 0355 11/30/20 0348  NA 134* 137 136 136 138 138  K 4.1 4.5 4.6 4.3 4.2 4.0  CL 102 105 103 104 103 101  CO2 21* 22 21* 22 25 26   GLUCOSE 182* 229* 203* 218* 147* 147*  BUN 22* 26* 27* 26* 26* 29*  CREATININE 0.95 0.83 0.87 0.80 0.88 0.75  CALCIUM 8.6* 8.8* 8.6* 8.6* 8.7* 8.7*  MG 2.1 2.3 2.3  --   --   --   PHOS 3.9 3.4 3.7  --   --   --    GFR: Estimated Creatinine Clearance: 110.8 mL/min (by C-G formula based on SCr of 0.75 mg/dL). Liver Function Tests: Recent Labs  Lab 11/26/20 0259 11/27/20 0259 11/28/20 0341 11/29/20 0355 11/30/20 0348  AST 37 38 60* 30 21  ALT 29 37 72* 61* 47*  ALKPHOS 50 51 53 54 53  BILITOT 0.6 0.7 0.9 1.0 1.2  PROT  6.5 6.6 6.3* 6.5 6.5  ALBUMIN 3.1* 3.2* 3.1* 3.2* 3.2*   No results for input(s): LIPASE, AMYLASE in the last 168 hours. No results for input(s): AMMONIA in the last 168 hours. Coagulation Profile: No results for input(s): INR, PROTIME in the last 168 hours. Cardiac Enzymes: No results for input(s): CKTOTAL, CKMB, CKMBINDEX, TROPONINI in the last 168 hours. BNP (last 3 results) No results for input(s): PROBNP in the last 8760 hours. HbA1C: No results for input(s): HGBA1C in the last 72 hours. CBG: Recent Labs  Lab 11/29/20 0737 11/29/20 1212 11/29/20 1633 11/29/20 2034 11/30/20 0728  GLUCAP 129* 197* 192* 195* 133*   Lipid Profile: No results for input(s): CHOL, HDL, LDLCALC, TRIG, CHOLHDL, LDLDIRECT in the last  72 hours. Thyroid Function Tests: No results for input(s): TSH, T4TOTAL, FREET4, T3FREE, THYROIDAB in the last 72 hours. Anemia Panel: Recent Labs    11/29/20 0355 11/30/20 0348  FERRITIN 486* 513*      Radiology Studies: I have reviewed all of the imaging during this hospital visit personally     Scheduled Meds: . vitamin C  500 mg Oral Daily  . aspirin EC  81 mg Oral Daily  . carvedilol  3.125 mg Oral BID  . Chlorhexidine Gluconate Cloth  6 each Topical Daily  . enoxaparin (LOVENOX) injection  40 mg Subcutaneous Q24H  . insulin aspart  0-9 Units Subcutaneous TID WC  . Ipratropium-Albuterol  1 puff Inhalation QID  . mouth rinse  15 mL Mouth Rinse BID  . methylPREDNISolone (SOLU-MEDROL) injection  40 mg Intravenous Daily  . pantoprazole  40 mg Oral Daily  . pravastatin  40 mg Oral Daily  . sodium chloride flush  3 mL Intravenous Q12H  . zinc sulfate  220 mg Oral Daily   Continuous Infusions:   LOS: 6 days        Kaysea Raya Annett Gula, MD

## 2020-12-01 ENCOUNTER — Inpatient Hospital Stay (HOSPITAL_COMMUNITY): Payer: 59

## 2020-12-01 DIAGNOSIS — R7989 Other specified abnormal findings of blood chemistry: Secondary | ICD-10-CM

## 2020-12-01 DIAGNOSIS — U071 COVID-19: Secondary | ICD-10-CM

## 2020-12-01 LAB — COMPREHENSIVE METABOLIC PANEL
ALT: 35 U/L (ref 0–44)
AST: 17 U/L (ref 15–41)
Albumin: 2.9 g/dL — ABNORMAL LOW (ref 3.5–5.0)
Alkaline Phosphatase: 45 U/L (ref 38–126)
Anion gap: 10 (ref 5–15)
BUN: 25 mg/dL — ABNORMAL HIGH (ref 6–20)
CO2: 24 mmol/L (ref 22–32)
Calcium: 8.5 mg/dL — ABNORMAL LOW (ref 8.9–10.3)
Chloride: 101 mmol/L (ref 98–111)
Creatinine, Ser: 0.81 mg/dL (ref 0.61–1.24)
GFR, Estimated: >60 mL/min (ref >60)
Glucose, Bld: 192 mg/dL — ABNORMAL HIGH (ref 70–99)
Potassium: 4.9 mmol/L (ref 3.5–5.1)
Sodium: 135 mmol/L (ref 135–145)
Total Bilirubin: 1 mg/dL (ref 0.3–1.2)
Total Protein: 6.3 g/dL — ABNORMAL LOW (ref 6.5–8.1)

## 2020-12-01 LAB — C-REACTIVE PROTEIN: CRP: 11.7 mg/dL — ABNORMAL HIGH (ref <1.0)

## 2020-12-01 LAB — FERRITIN: Ferritin: 599 ng/mL — ABNORMAL HIGH (ref 24–336)

## 2020-12-01 LAB — GLUCOSE, CAPILLARY
Glucose-Capillary: 177 mg/dL — ABNORMAL HIGH (ref 70–99)
Glucose-Capillary: 225 mg/dL — ABNORMAL HIGH (ref 70–99)
Glucose-Capillary: 209 mg/dL — ABNORMAL HIGH (ref 70–99)
Glucose-Capillary: 230 mg/dL — ABNORMAL HIGH (ref 70–99)

## 2020-12-01 LAB — D-DIMER, QUANTITATIVE: D-Dimer, Quant: 3.56 ug/mL-FEU — ABNORMAL HIGH (ref 0.00–0.50)

## 2020-12-01 MED ORDER — ENOXAPARIN SODIUM 100 MG/ML ~~LOC~~ SOLN
1.0000 mg/kg | Freq: Two times a day (BID) | SUBCUTANEOUS | Status: DC
  Administered 2020-12-01 – 2020-12-03 (×6): 95 mg via SUBCUTANEOUS
  Filled 2020-12-01 (×6): qty 1

## 2020-12-01 MED ORDER — METHYLPREDNISOLONE SODIUM SUCC 40 MG IJ SOLR
40.0000 mg | Freq: Three times a day (TID) | INTRAMUSCULAR | Status: DC
  Administered 2020-12-01 – 2020-12-05 (×11): 40 mg via INTRAVENOUS
  Filled 2020-12-01 (×11): qty 1

## 2020-12-01 MED ORDER — FUROSEMIDE 10 MG/ML IJ SOLN
40.0000 mg | Freq: Once | INTRAMUSCULAR | Status: AC
  Administered 2020-12-01: 40 mg via INTRAVENOUS
  Filled 2020-12-01: qty 4

## 2020-12-01 NOTE — Progress Notes (Signed)
PROGRESS NOTE    Rhyker Silversmith  AOZ:[id number] DOB: Feb 15, 1961 DOA: 11/24/2020 PCP: Ambrose Mantle, MD    Brief Narrative:  Mr. Sanderson was admitted to the hospital with a working diagnosis of acute hypoxic respiratory failure due to SARS COVID-19 viral pneumonia.  60 year old male unvaccinated for COVID-19 with past medical history for coronary artery disease, ischemic cardiomyopathy who tested positive for COVID-19 11/18/2020, his symptoms were consistent with body aches, cough and fevers, he received steroids as an outpatient with no improvement of his symptoms. On his initial physical examination his oxygenation was in the mid 80s with exertion, he was febrile 101.23F, respiratoryrate 33, oxygen saturation93% on supplemental oxygen, heart rate 102,blood pressure 137/86,his lungs had no wheezing or rails, heart S1-S2, present rhythmic, soft abdomen, no lower extremity edema. SARS COVID-19 positive,  Chest radiograph with faint interstitial infiltrates bilaterally upper lobes, predominantly on the right side. EKG 103 bpm, normal axis, normal intervals, sinus rhythm, no ST segment or T wave changes.  Placed on aggressive medical therapy with systemic steroids and remdesivr.   Trial of diuresis for non cardiogenic pulmonary edema.  Continue to have high oxygen requirements, using non rebreather mask and high flow nasal cannula.    Assessment & Plan:   Principal Problem:   Pneumonia due to COVID-19 virus Active Problems:   Cardiomyopathy, ischemic   CAD (coronary artery disease)   Pure hypercholesterolemia   Acute respiratory disease due to COVID-19 virus   Acute respiratory failure with hypoxia (HCC)   1. Acute hypoxemic respiratory failure due to SARS COVId 19 viral pneumonia.  RR: 16  Pulse oxymetry: 92%  Fi02: 15 L/min per HFNC/ non rebreather mask  COVID-19 Labs  Recent Labs    11/29/20 0355 11/30/20 0348 12/01/20 0331  DDIMER 0.46 0.63* 3.56*  FERRITIN 486*  513* 599*  CRP 0.7 7.9* 11.7*    Lab Results  Component Value Date   SARSCOV2NAA POSITIVE (A) 11/18/2020    01/27chest radiograph with bilateral interstitial infiltrates.  Continue to have high oxygen requirements, and now with worsening inflammatory markers. D dimer up to 3,56.   Increase enoxaparin to therapeutic dose 1mg /kg bid, and check lower extremities, plan to get CT chest angiography in am.  Follow on chest film today.   Continue high dose systemic steroids with methylprednisolone 40 mg to tid, bronchodilator therapy, antitussive agents and airway clearing techniques.  Encourage to be out of bed to chair tid with meals.   Keep oxygen saturation 85% or greater.  Monitor for work of breathing and mentation. Upgrade level of care to progressive.   2. HTN/ CAD.Echocardiogram from 2014 witrh preserved LV systolic function.Mild hypokinesis of the basal mid anterior septal myocardium.   Blood pressure control with carvedilol.  On statin and aspirin.  3. Dyslipidemia.Continue withpravastatin.  4. Steroid induced hyperglycemia. Continue insulin sliding scale for glucose cover and monitoring.  Fasting glucose 192 today.   Patient continue to be at high risk for worsening respiratory failure   Status is: Inpatient  Remains inpatient appropriate because:IV treatments appropriate due to intensity of illness or inability to take PO   Dispo: The patient is from: Home              Anticipated d/c is to: Home              Anticipated d/c date is: > 3 days              Patient currently is not medically stable to d/c.  Difficult to place patient No   DVT prophylaxis: Enoxaparin   Code Status:   full  Family Communication:  I spoke over the phone with the patient's wife about patient's  condition, plan of care, prognosis and all questions were addressed.   Subjective: Patient with persistent hypoxemia, he reports feeling "better", but continue to have  dyspnea with minimal efforts. No nausea or vomiting.   Objective: Vitals:   11/30/20 1321 11/30/20 2054 12/01/20 0409 12/01/20 0728  BP: 126/76 119/74 123/76   Pulse: 93 70 65   Resp: (!) 24 20 16    Temp: 98.9 F (37.2 C) 98.1 F (36.7 C) 98.5 F (36.9 C)   TempSrc: Oral Oral Oral   SpO2: (!) 87% 93% 92% (!) 88%  Weight:      Height:        Intake/Output Summary (Last 24 hours) at 12/01/2020 0943 Last data filed at 12/01/2020 0900 Gross per 24 hour  Intake 960 ml  Output 1430 ml  Net -470 ml   Filed Weights   11/26/20 1120  Weight: 94.3 kg    Examination:   General: patient very weak and deconditioned  Neurology: Awake and alert, non focal  E ENT: no pallor, no icterus, oral mucosa moist Cardiovascular: No JVD. S1-S2 present, rhythmic, no gallops, rubs, or murmurs. No lower extremity edema. Pulmonary: positive breath sounds bilaterally,  Decreased air movement,  Gastrointestinal. Abdomen soft and non tender Skin. No rashes Musculoskeletal: no joint deformities     Data Reviewed: I have personally reviewed following labs and imaging studies  CBC: Recent Labs  Lab 11/24/20 1907 11/25/20 0232 11/26/20 0259 11/27/20 0259  WBC 6.5 4.6 8.5 11.5*  NEUTROABS 4.7 3.6 7.2 10.3*  HGB 15.1 14.2 14.3 14.3  HCT 45.4 43.4 44.7 43.8  MCV 81.1 82.4 83.1 83.3  PLT 228 192 228 252   Basic Metabolic Panel: Recent Labs  Lab 11/25/20 0232 11/26/20 0259 11/27/20 0259 11/28/20 0341 11/29/20 0355 11/30/20 0348 12/01/20 0331  NA 134* 137 136 136 138 138 135  K 4.1 4.5 4.6 4.3 4.2 4.0 4.9  CL 102 105 103 104 103 101 101  CO2 21* 22 21* 22 25 26 24   GLUCOSE 182* 229* 203* 218* 147* 147* 192*  BUN 22* 26* 27* 26* 26* 29* 25*  CREATININE 0.95 0.83 0.87 0.80 0.88 0.75 0.81  CALCIUM 8.6* 8.8* 8.6* 8.6* 8.7* 8.7* 8.5*  MG 2.1 2.3 2.3  --   --   --   --   PHOS 3.9 3.4 3.7  --   --   --   --    GFR: Estimated Creatinine Clearance: 109.4 mL/min (by C-G formula based on SCr  of 0.81 mg/dL). Liver Function Tests: Recent Labs  Lab 11/27/20 0259 11/28/20 0341 11/29/20 0355 11/30/20 0348 12/01/20 0331  AST 38 60* 30 21 17   ALT 37 72* 61* 47* 35  ALKPHOS 51 53 54 53 45  BILITOT 0.7 0.9 1.0 1.2 1.0  PROT 6.6 6.3* 6.5 6.5 6.3*  ALBUMIN 3.2* 3.1* 3.2* 3.2* 2.9*   No results for input(s): LIPASE, AMYLASE in the last 168 hours. No results for input(s): AMMONIA in the last 168 hours. Coagulation Profile: No results for input(s): INR, PROTIME in the last 168 hours. Cardiac Enzymes: No results for input(s): CKTOTAL, CKMB, CKMBINDEX, TROPONINI in the last 168 hours. BNP (last 3 results) No results for input(s): PROBNP in the last 8760 hours. HbA1C: No results for input(s): HGBA1C in the last 72 hours.  CBG: Recent Labs  Lab 11/30/20 0728 11/30/20 1130 11/30/20 1619 11/30/20 2058 12/01/20 0730  GLUCAP 133* 159* 220* 203* 177*   Lipid Profile: No results for input(s): CHOL, HDL, LDLCALC, TRIG, CHOLHDL, LDLDIRECT in the last 72 hours. Thyroid Function Tests: No results for input(s): TSH, T4TOTAL, FREET4, T3FREE, THYROIDAB in the last 72 hours. Anemia Panel: Recent Labs    11/30/20 0348 12/01/20 0331  FERRITIN 513* 599*      Radiology Studies: I have reviewed all of the imaging during this hospital visit personally     Scheduled Meds: . vitamin C  500 mg Oral Daily  . aspirin EC  81 mg Oral Daily  . carvedilol  3.125 mg Oral BID  . chlorpheniramine-HYDROcodone  5 mL Oral Q12H  . enoxaparin (LOVENOX) injection  40 mg Subcutaneous Q24H  . insulin aspart  0-9 Units Subcutaneous TID WC  . Ipratropium-Albuterol  1 puff Inhalation QID  . mouth rinse  15 mL Mouth Rinse BID  . methylPREDNISolone (SOLU-MEDROL) injection  40 mg Intravenous Q12H  . pantoprazole  40 mg Oral Daily  . pravastatin  40 mg Oral Daily  . sodium chloride flush  3 mL Intravenous Q12H  . zinc sulfate  220 mg Oral Daily   Continuous Infusions:   LOS: 7 days         Mauricio Annett Gula, MD

## 2020-12-01 NOTE — Progress Notes (Signed)
Writer has given report to receiving nurse on Progressive Unit, Abby, at this time. Will move pt when nurse tech returns from lunch.

## 2020-12-01 NOTE — Progress Notes (Signed)
Pt has been moved to Room 1435. Receiving nurse to alert family.

## 2020-12-01 NOTE — CV Procedure (Signed)
BLE venous completed.  Results can be found under chart review under CV PROC. 12/01/2020 2:10 PM Shantal Roan RVT, RDMS

## 2020-12-01 NOTE — Progress Notes (Signed)
Writer "secure chat" MD this morning r/t increased O2 needs with sats in low 80s. Pt states he "feels better"; yet, numbers have increased. Monitoring pt closely. He is pleasant and without c/o. MD has ordered pt to move to Progressive Unit. Have put in for bed.  Will move pt when bed available.

## 2020-12-02 LAB — COMPREHENSIVE METABOLIC PANEL
ALT: 40 U/L (ref 0–44)
AST: 21 U/L (ref 15–41)
Albumin: 2.8 g/dL — ABNORMAL LOW (ref 3.5–5.0)
Alkaline Phosphatase: 50 U/L (ref 38–126)
Anion gap: 12 (ref 5–15)
BUN: 34 mg/dL — ABNORMAL HIGH (ref 6–20)
CO2: 23 mmol/L (ref 22–32)
Calcium: 8.7 mg/dL — ABNORMAL LOW (ref 8.9–10.3)
Chloride: 101 mmol/L (ref 98–111)
Creatinine, Ser: 0.83 mg/dL (ref 0.61–1.24)
GFR, Estimated: >60 mL/min (ref >60)
Glucose, Bld: 182 mg/dL — ABNORMAL HIGH (ref 70–99)
Potassium: 4.5 mmol/L (ref 3.5–5.1)
Sodium: 136 mmol/L (ref 135–145)
Total Bilirubin: 1 mg/dL (ref 0.3–1.2)
Total Protein: 6.4 g/dL — ABNORMAL LOW (ref 6.5–8.1)

## 2020-12-02 LAB — GLUCOSE, CAPILLARY
Glucose-Capillary: 175 mg/dL — ABNORMAL HIGH (ref 70–99)
Glucose-Capillary: 290 mg/dL — ABNORMAL HIGH (ref 70–99)
Glucose-Capillary: 191 mg/dL — ABNORMAL HIGH (ref 70–99)
Glucose-Capillary: 183 mg/dL — ABNORMAL HIGH (ref 70–99)

## 2020-12-02 LAB — D-DIMER, QUANTITATIVE: D-Dimer, Quant: 0.75 ug/mL-FEU — ABNORMAL HIGH (ref 0.00–0.50)

## 2020-12-02 LAB — C-REACTIVE PROTEIN: CRP: 4.5 mg/dL — ABNORMAL HIGH (ref <1.0)

## 2020-12-02 LAB — FERRITIN: Ferritin: 581 ng/mL — ABNORMAL HIGH (ref 24–336)

## 2020-12-02 MED ORDER — FUROSEMIDE 10 MG/ML IJ SOLN
40.0000 mg | Freq: Once | INTRAMUSCULAR | Status: AC
  Administered 2020-12-02: 40 mg via INTRAVENOUS
  Filled 2020-12-02: qty 4

## 2020-12-02 MED ORDER — METHYLPREDNISOLONE SODIUM SUCC 125 MG IJ SOLR
INTRAMUSCULAR | Status: AC
  Filled 2020-12-02: qty 2

## 2020-12-02 NOTE — Plan of Care (Signed)
  Problem: Clinical Measurements: Goal: Will remain free from infection Outcome: Progressing Goal: Respiratory complications will improve Outcome: Progressing   Problem: Activity: Goal: Risk for activity intolerance will decrease Outcome: Progressing   Problem: Pain Managment: Goal: General experience of comfort will improve Outcome: Progressing   Problem: Safety: Goal: Ability to remain free from injury will improve Outcome: Progressing   Problem: Skin Integrity: Goal: Risk for impaired skin integrity will decrease Outcome: Progressing   

## 2020-12-02 NOTE — Progress Notes (Signed)
PROGRESS NOTE    Sabastion Hrdlicka  KDX:[id number] DOB: April 28, 1961 DOA: 11/24/2020 PCP: Ambrose Mantle, MD    Brief Narrative:  Mr. Barstow was admitted to the hospital with a working diagnosis of acute hypoxic respiratory failure due to SARS COVID-19 viral pneumonia.  60 year old male unvaccinated for COVID-19 with past medical history for coronary artery disease, ischemic cardiomyopathy who tested positive for COVID-19 11/18/2020, his symptoms were consistent with body aches, cough and fevers, he received steroids as an outpatient with no improvement of his symptoms. On his initial physical examination his oxygenation was in the mid 80s with exertion, he was febrile 101.32F, respiratoryrate 33, oxygen saturation93% on supplemental oxygen, heart rate 102,blood pressure 137/86,his lungs had no wheezing or rails, heart S1-S2, present rhythmic, soft abdomen, no lower extremity edema. SARS COVID-19 positive,  Chest radiograph with faint interstitial infiltrates bilaterally upper lobes, predominantly on the right side. EKG 103 bpm, normal axis, normal intervals, sinus rhythm, no ST segment or T wave changes.  Placed on aggressive medical therapy with systemic steroids and remdesivr.  Trial of diuresis for non cardiogenic pulmonary edema.  Continue to have high oxygen requirements, using non rebreather mask and high flow nasal cannula.  01/30 transferred to progressive care due to worsening hypoxemia and pulmonary infiltrates.    Assessment & Plan:   Principal Problem:   Pneumonia due to COVID-19 virus Active Problems:   Cardiomyopathy, ischemic   CAD (coronary artery disease)   Pure hypercholesterolemia   Acute respiratory disease due to COVID-19 virus   Acute respiratory failure with hypoxia (HCC)    1. Acute hypoxemic respiratory failure due to SARS COVId 19 viral pneumonia.  RR: 23  Pulse oxymetry: 95%  Fi02: 15 L/min per non re-breather mask.    COVID-19 Labs  Recent  Labs    11/30/20 0348 12/01/20 0331 12/02/20 0434  DDIMER 0.63* 3.56* 0.75*  FERRITIN 513* 599* 581*  CRP 7.9* 11.7* 4.5*    Lab Results  Component Value Date   SARSCOV2NAA POSITIVE (A) 11/18/2020    01/27chest radiograph with bilateral interstitial infiltrates. 01/30 chest film with worsening bilateral interstitial infiltrates in the periphery, upper and lower lobes.  [place] negative for DVT Had furosemide for acute non cardiogenic pulmonary edema.   Documented urine output over last 24 hrs 2,350 ml.   This am he reports improvement in dyspnea, inflammatory markers now trending down.   Tolerating well high dose systemic steroids withmethylprednisolone 40 mgto tid, along with bronchodilator therapy, antitussive agents and airway clearing techniques.  Continue to encourage to be out of bed to chair tid with meals.   Plan to keep therapeutic dose of enoxaparin today and will check CT chest in am.   Repeat dose of furosemide today 40 mg IV   Keep oxygenation 85% or greater, monitor mentation and work of breathing.   2. HTN/ CAD.Echocardiogram from 2014 witrh preserved LV systolic function.Mild hypokinesis of the basal mid anterior septal myocardium.   Continue with carvedilol, statin and aspirin.  3. Dyslipidemia.Onpravastatin.  4. Steroid induced hyperglycemia.On sliding scale for glucose cover and monitoring.  Patient continue to be at high risk for worsening respiratory failure   Status is: Inpatient  Remains inpatient appropriate because:IV treatments appropriate due to intensity of illness or inability to take PO   Dispo: The patient is from: Home              Anticipated d/c is to: Home  Anticipated d/c date is: 3 days              Patient currently is not medically stable to d/c.   Difficult to place patient No   DVT prophylaxis: Enoxaparin   Code Status:   full  Family Communication:  I spoke over the phone with the patient's wife  about patient's  condition, plan of care, prognosis and all questions were addressed.   Subjective: Patient reports improvement in his dyspnea, no nausea or vomiting, continue using non rebreather mask intermittent with high flow nasal cannula,   Objective: Vitals:   12/01/20 2017 12/01/20 2358 12/02/20 0409 12/02/20 0600  BP: 111/70 114/70 115/77   Pulse: 66 64 62   Resp: 20 18 18    Temp: 99 F (37.2 C) 98.1 F (36.7 C) 97.8 F (36.6 C)   TempSrc:      SpO2: 91% 90% 93% 95%  Weight:      Height:        Intake/Output Summary (Last 24 hours) at 12/02/2020 12/04/2020 Last data filed at 12/02/2020 0400 Gross per 24 hour  Intake 480 ml  Output 2350 ml  Net -1870 ml   Filed Weights   11/26/20 1120  Weight: 94.3 kg    Examination:   General: Not in pain or dyspnea, deconditioned  Neurology: Awake and alert, non focal  E ENT: no pallor, no icterus, oral mucosa moist Cardiovascular: No JVD. S1-S2 present, rhythmic, no gallops, rubs, or murmurs. No lower extremity edema. Pulmonary: positive breath sounds bilaterally, with no wheezing or rhonchi but bilateral rales. Gastrointestinal. Abdomen soft and non tender Skin. No rashes Musculoskeletal: no joint deformities     Data Reviewed: I have personally reviewed following labs and imaging studies  CBC: Recent Labs  Lab 11/26/20 0259 11/27/20 0259  WBC 8.5 11.5*  NEUTROABS 7.2 10.3*  HGB 14.3 14.3  HCT 44.7 43.8  MCV 83.1 83.3  PLT 228 252   Basic Metabolic Panel: Recent Labs  Lab 11/26/20 0259 11/27/20 0259 11/28/20 0341 11/29/20 0355 11/30/20 0348 12/01/20 0331 12/02/20 0434  NA 137 136 136 138 138 135 136  K 4.5 4.6 4.3 4.2 4.0 4.9 4.5  CL 105 103 104 103 101 101 101  CO2 22 21* 22 25 26 24 23   GLUCOSE 229* 203* 218* 147* 147* 192* 182*  BUN 26* 27* 26* 26* 29* 25* 34*  CREATININE 0.83 0.87 0.80 0.88 0.75 0.81 0.83  CALCIUM 8.8* 8.6* 8.6* 8.7* 8.7* 8.5* 8.7*  MG 2.3 2.3  --   --   --   --   --   PHOS 3.4  3.7  --   --   --   --   --    GFR: Estimated Creatinine Clearance: 106.8 mL/min (by C-G formula based on SCr of 0.83 mg/dL). Liver Function Tests: Recent Labs  Lab 11/28/20 0341 11/29/20 0355 11/30/20 0348 12/01/20 0331 12/02/20 0434  AST 60* 30 21 17 21   ALT 72* 61* 47* 35 40  ALKPHOS 53 54 53 45 50  BILITOT 0.9 1.0 1.2 1.0 1.0  PROT 6.3* 6.5 6.5 6.3* 6.4*  ALBUMIN 3.1* 3.2* 3.2* 2.9* 2.8*   No results for input(s): LIPASE, AMYLASE in the last 168 hours. No results for input(s): AMMONIA in the last 168 hours. Coagulation Profile: No results for input(s): INR, PROTIME in the last 168 hours. Cardiac Enzymes: No results for input(s): CKTOTAL, CKMB, CKMBINDEX, TROPONINI in the last 168 hours. BNP (last 3 results) No results  for input(s): PROBNP in the last 8760 hours. HbA1C: No results for input(s): HGBA1C in the last 72 hours. CBG: Recent Labs  Lab 12/01/20 0730 12/01/20 1114 12/01/20 1622 12/01/20 2015 12/02/20 0748  GLUCAP 177* 225* 209* 230* 175*   Lipid Profile: No results for input(s): CHOL, HDL, LDLCALC, TRIG, CHOLHDL, LDLDIRECT in the last 72 hours. Thyroid Function Tests: No results for input(s): TSH, T4TOTAL, FREET4, T3FREE, THYROIDAB in the last 72 hours. Anemia Panel: Recent Labs    12/01/20 0331 12/02/20 0434  FERRITIN 599* 581*      Radiology Studies: I have reviewed all of the imaging during this hospital visit personally     Scheduled Meds: . vitamin C  500 mg Oral Daily  . aspirin EC  81 mg Oral Daily  . carvedilol  3.125 mg Oral BID  . chlorpheniramine-HYDROcodone  5 mL Oral Q12H  . enoxaparin (LOVENOX) injection  1 mg/kg Subcutaneous Q12H  . insulin aspart  0-9 Units Subcutaneous TID WC  . Ipratropium-Albuterol  1 puff Inhalation QID  . mouth rinse  15 mL Mouth Rinse BID  . methylPREDNISolone (SOLU-MEDROL) injection  40 mg Intravenous Q8H  . pantoprazole  40 mg Oral Daily  . pravastatin  40 mg Oral Daily  . sodium chloride flush   3 mL Intravenous Q12H  . zinc sulfate  220 mg Oral Daily   Continuous Infusions:   LOS: 8 days        Mauricio Annett Gula, MD

## 2020-12-02 NOTE — Progress Notes (Signed)
Physical Therapy Treatment Patient Details Name: Curtis Rosales MRN: [id number] DOB: 27-May-1961 Today's Date: 12/02/2020    History of Present Illness Pt is 60 year old male unvaccinated for COVID-19 with past medical history for coronary artery disease, ischemic cardiomyopathy who tested positive for COVID-19 11/18/2020. Now admitted with diagnosis of COVID pneumonia.    PT Comments    Pt reports MD wanted him on PRB mask at rest.  Pt attempted standing and marching in place on only PRB however required both 15L PRB and 15L HFNC for phsyical activity (for details see mobility section).  Pt fatigues quickly.  Pt presents with limited endurance and increased oxygen supplementation at this time.   Follow Up Recommendations  Home health PT;Supervision for mobility/OOB     Equipment Recommendations  None recommended by PT    Recommendations for Other Services       Precautions / Restrictions Precautions Precautions: Other (comment) Precaution Comments: Monitor Sats.    Mobility  Bed Mobility Overal bed mobility: Modified Independent                Transfers Overall transfer level: Needs assistance Equipment used: None Transfers: Sit to/from Stand Sit to Stand: Supervision            Ambulation/Gait Ambulation/Gait assistance: Min assist;Min guard           General Gait Details: Pt performed Stand and march in place for 1 minute x3. First, on 15L PRB and dropped to 81% and then 79% with recovery so added 15L HFNC and pt took seated rest break for a couple minutes until improved to 90%. Pt performed again on both 15L PRB and 15L HFNC dropped to 84%.  On third time, pt again dropped to 83% and required  2-3 minute recovery to 93%.  Removed HFNC prior to leaving room and pt with SPO2 93% on PRB.   Stairs             Wheelchair Mobility    Modified Rankin (Stroke Patients Only)       Balance                                             Cognition Arousal/Alertness: Awake/alert Behavior During Therapy: WFL for tasks assessed/performed Overall Cognitive Status: Within Functional Limits for tasks assessed                                        Exercises      General Comments        Pertinent Vitals/Pain Pain Assessment: No/denies pain    Home Living                      Prior Function            PT Goals (current goals can now be found in the care plan section) Progress towards PT goals: Progressing toward goals    Frequency    Min 3X/week      PT Plan Current plan remains appropriate    Co-evaluation              AM-PAC PT "6 Clicks" Mobility   Outcome Measure  Help needed turning from your back to your side while in a flat bed without using bedrails?: None Help  needed moving from lying on your back to sitting on the side of a flat bed without using bedrails?: None Help needed moving to and from a bed to a chair (including a wheelchair)?: A Little Help needed standing up from a chair using your arms (e.g., wheelchair or bedside chair)?: A Little Help needed to walk in hospital room?: A Little Help needed climbing 3-5 steps with a railing? : A Little 6 Click Score: 20    End of Session Equipment Utilized During Treatment: Oxygen Activity Tolerance: Patient tolerated treatment well Patient left: in bed;with call bell/phone within reach;with bed alarm set (no recliner in room)   PT Visit Diagnosis: Difficulty in walking, not elsewhere classified (R26.2)     Time: 0017-4944 PT Time Calculation (min) (ACUTE ONLY): 27 min  Charges:  $Gait Training: 23-37 mins                    Thomasene Mohair PT, DPT Acute Rehabilitation Services Pager: [phone] Office: [phone]  Sarajane Jews 12/02/2020, 3:49 PM

## 2020-12-03 ENCOUNTER — Encounter (HOSPITAL_COMMUNITY): Payer: Self-pay | Admitting: Internal Medicine

## 2020-12-03 ENCOUNTER — Inpatient Hospital Stay (HOSPITAL_COMMUNITY): Payer: 59

## 2020-12-03 LAB — COMPREHENSIVE METABOLIC PANEL
ALT: 37 U/L (ref 0–44)
AST: 17 U/L (ref 15–41)
Albumin: 2.9 g/dL — ABNORMAL LOW (ref 3.5–5.0)
Alkaline Phosphatase: 51 U/L (ref 38–126)
Anion gap: 10 (ref 5–15)
BUN: 42 mg/dL — ABNORMAL HIGH (ref 6–20)
CO2: 27 mmol/L (ref 22–32)
Calcium: 8.8 mg/dL — ABNORMAL LOW (ref 8.9–10.3)
Chloride: 101 mmol/L (ref 98–111)
Creatinine, Ser: 1.01 mg/dL (ref 0.61–1.24)
GFR, Estimated: >60 mL/min (ref >60)
Glucose, Bld: 193 mg/dL — ABNORMAL HIGH (ref 70–99)
Potassium: 5 mmol/L (ref 3.5–5.1)
Sodium: 138 mmol/L (ref 135–145)
Total Bilirubin: 0.7 mg/dL (ref 0.3–1.2)
Total Protein: 6.6 g/dL (ref 6.5–8.1)

## 2020-12-03 LAB — D-DIMER, QUANTITATIVE: D-Dimer, Quant: 0.76 ug/mL-FEU — ABNORMAL HIGH (ref 0.00–0.50)

## 2020-12-03 LAB — GLUCOSE, CAPILLARY
Glucose-Capillary: 183 mg/dL — ABNORMAL HIGH (ref 70–99)
Glucose-Capillary: 257 mg/dL — ABNORMAL HIGH (ref 70–99)
Glucose-Capillary: 185 mg/dL — ABNORMAL HIGH (ref 70–99)
Glucose-Capillary: 179 mg/dL — ABNORMAL HIGH (ref 70–99)

## 2020-12-03 LAB — C-REACTIVE PROTEIN: CRP: 1.3 mg/dL — ABNORMAL HIGH (ref <1.0)

## 2020-12-03 LAB — FERRITIN: Ferritin: 430 ng/mL — ABNORMAL HIGH (ref 24–336)

## 2020-12-03 MED ORDER — ENOXAPARIN SODIUM 40 MG/0.4ML ~~LOC~~ SOLN
40.0000 mg | SUBCUTANEOUS | Status: DC
  Administered 2020-12-04 – 2020-12-14 (×11): 40 mg via SUBCUTANEOUS
  Filled 2020-12-03 (×12): qty 0.4

## 2020-12-03 MED ORDER — IOHEXOL 350 MG/ML SOLN
100.0000 mL | Freq: Once | INTRAVENOUS | Status: AC | PRN
  Administered 2020-12-03: 100 mL via INTRAVENOUS

## 2020-12-03 NOTE — Progress Notes (Signed)
PROGRESS NOTE    Curtis Rosales  GEX:[id number] DOB: 12/04/1960 DOA: 11/24/2020 PCP: Ambrose Mantle, MD    Brief Narrative:  Curtis Rosales was admitted to the hospital with a working diagnosis of acute hypoxic respiratory failure due to SARS COVID-19 viral pneumonia.  60 year old male unvaccinated for COVID-19 with past medical history for coronary artery disease, ischemic cardiomyopathy who tested positive for COVID-19 11/18/2020, his symptoms were consistent with body aches, cough and fevers, he received steroids as an outpatient with no improvement of his symptoms. On his initial physical examination his oxygenation was in the mid 80s with exertion, he was febrile 101.26F, respiratoryrate 33, oxygen saturation93% on supplemental oxygen, heart rate 102,blood pressure 137/86,his lungs had no wheezing or rails, heart S1-S2, present rhythmic, soft abdomen, no lower extremity edema. SARS COVID-19 positive,  Chest radiograph with faint interstitial infiltrates bilaterally upper lobes, predominantly on the right side. EKG 103 bpm, normal axis, normal intervals, sinus rhythm, no ST segment or T wave changes.  Placed on aggressive medical therapy with systemic steroids and remdesivr.  Continue to have high oxygen requirements, using non rebreather mask and high flow nasal cannula.  01/30 transferred to progressive care due to worsening hypoxemia and pulmonary infiltrates.   Placed on diuresis for non cardiogenic pulmonary edema with good toleration.   Assessment & Plan:   Principal Problem:   Pneumonia due to COVID-19 virus Active Problems:   Cardiomyopathy, ischemic   CAD (coronary artery disease)   Pure hypercholesterolemia   Acute respiratory disease due to COVID-19 virus   Acute respiratory failure with hypoxia (HCC)   1. Acute hypoxemic respiratory failure due to SARS COVId 19 viral pneumonia.  RR: 19  Pulse oxymetry: 90%  Fi02: from 15 L HFNC down to 9 L/ per min per HFNC    COVID-19 Labs  Recent Labs    12/01/20 0331 12/02/20 0434 12/03/20 0438  DDIMER 3.56* 0.75* 0.76*  FERRITIN 599* 581* 430*  CRP 11.7* 4.5* 1.3*    Lab Results  Component Value Date   SARSCOV2NAA POSITIVE (A) 11/18/2020    01/27chest radiograph with bilateral interstitial infiltrates. 01/30 chest film with worsening bilateral interstitial infiltrates in the periphery, upper and lower lobes.  [place] negative for DVT  Urine output over last 24 hrs 2,200 ml   Plan to get CT chest PE, to evaluate lung parenchyma and rule out pulmonary embolism.  For now will continue therapeutic enoxaparin.    Continue medical therapy with high dose systemic steroids methylprednisolone 40 mgtotid, bronchodilator therapy, antitussive agents and airway clearing techniques.  Out of bed to chair, continue to encourage mobility. Keep oxygenation 88% or greater.    2. HTN/ CAD.Echocardiogram from 2014 witrh preserved LV systolic function.Mild hypokinesis of the basal mid anterior septal myocardium.   Continue with carvedilol, statin and aspirin.  3. Dyslipidemia.Continue withpravastatin.  4. Steroid induced hyperglycemia.fasting glucose this am 193, continue glucose cover and monitoring with insulin sliding scale. Patient is tolerating po well.   Patient continue to be at high risk for worsening respiratory failure   Status is: Inpatient  Remains inpatient appropriate because:IV treatments appropriate due to intensity of illness or inability to take PO   Dispo: The patient is from: Home              Anticipated d/c is to: Home              Anticipated d/c date is: > 3 days  Patient currently is not medically stable to d/c.   Difficult to place patient No  DVT prophylaxis: Enoxaparin   Code Status:   full  Family Communication:  I spoke over the phone with the patient's wife about patient's  condition, plan of care, prognosis and all questions were  addressed.   Subjective: Patient reports improvement of dyspnea, but not yet back to baseline, continue to have significant dyspnea with movement, no nausea or vomiting, no chest pain, out of bed to chair.   Objective: Vitals:   12/03/20 0752 12/03/20 1208 12/03/20 1306 12/03/20 1510  BP:  124/63 131/79   Pulse: 65 72 71 72  Resp:   20 18  Temp:   97.9 F (36.6 C)   TempSrc:   Oral   SpO2: 92%  95% 90%  Weight:      Height:        Intake/Output Summary (Last 24 hours) at 12/03/2020 1656 Last data filed at 12/03/2020 1307 Gross per 24 hour  Intake 480 ml  Output 850 ml  Net -370 ml   Filed Weights   11/26/20 1120  Weight: 94.3 kg    Examination:   General: Not in pain, positive dyspnea with minimal movement,  Neurology: Awake and alert, non focal  E ENT: mild pallor, no icterus, oral mucosa moist Cardiovascular: No JVD. S1-S2 present, rhythmic, no gallops, rubs, or murmurs. No lower extremity edema. Pulmonary: positive  breath sounds bilaterally, decreased rales compared to yesterday, no wheezing Gastrointestinal. Abdomen soft and non tender Skin. No rashes Musculoskeletal: no joint deformities     Data Reviewed: I have personally reviewed following labs and imaging studies  CBC: Recent Labs  Lab 11/27/20 0259  WBC 11.5*  NEUTROABS 10.3*  HGB 14.3  HCT 43.8  MCV 83.3  PLT 252   Basic Metabolic Panel: Recent Labs  Lab 11/27/20 0259 11/28/20 0341 11/29/20 0355 11/30/20 0348 12/01/20 0331 12/02/20 0434 12/03/20 0438  NA 136   < > 138 138 135 136 138  K 4.6   < > 4.2 4.0 4.9 4.5 5.0  CL 103   < > 103 101 101 101 101  CO2 21*   < > 25 26 24 23 27   GLUCOSE 203*   < > 147* 147* 192* 182* 193*  BUN 27*   < > 26* 29* 25* 34* 42*  CREATININE 0.87   < > 0.88 0.75 0.81 0.83 1.01  CALCIUM 8.6*   < > 8.7* 8.7* 8.5* 8.7* 8.8*  MG 2.3  --   --   --   --   --   --   PHOS 3.7  --   --   --   --   --   --    < > = values in this interval not displayed.    GFR: Estimated Creatinine Clearance: 87.8 mL/min (by C-G formula based on SCr of 1.01 mg/dL). Liver Function Tests: Recent Labs  Lab 11/29/20 0355 11/30/20 0348 12/01/20 0331 12/02/20 0434 12/03/20 0438  AST 30 21 17 21 17   ALT 61* 47* 35 40 37  ALKPHOS 54 53 45 50 51  BILITOT 1.0 1.2 1.0 1.0 0.7  PROT 6.5 6.5 6.3* 6.4* 6.6  ALBUMIN 3.2* 3.2* 2.9* 2.8* 2.9*   No results for input(s): LIPASE, AMYLASE in the last 168 hours. No results for input(s): AMMONIA in the last 168 hours. Coagulation Profile: No results for input(s): INR, PROTIME in the last 168 hours. Cardiac Enzymes: No results for  input(s): CKTOTAL, CKMB, CKMBINDEX, TROPONINI in the last 168 hours. BNP (last 3 results) No results for input(s): PROBNP in the last 8760 hours. HbA1C: No results for input(s): HGBA1C in the last 72 hours. CBG: Recent Labs  Lab 12/02/20 1647 12/02/20 2028 12/03/20 0809 12/03/20 1142 12/03/20 1622  GLUCAP 191* 183* 183* 257* 185*   Lipid Profile: No results for input(s): CHOL, HDL, LDLCALC, TRIG, CHOLHDL, LDLDIRECT in the last 72 hours. Thyroid Function Tests: No results for input(s): TSH, T4TOTAL, FREET4, T3FREE, THYROIDAB in the last 72 hours. Anemia Panel: Recent Labs    12/02/20 0434 12/03/20 0438  FERRITIN 581* 430*      Radiology Studies: I have reviewed all of the imaging during this hospital visit personally     Scheduled Meds: . vitamin C  500 mg Oral Daily  . aspirin EC  81 mg Oral Daily  . carvedilol  3.125 mg Oral BID  . chlorpheniramine-HYDROcodone  5 mL Oral Q12H  . enoxaparin (LOVENOX) injection  1 mg/kg Subcutaneous Q12H  . insulin aspart  0-9 Units Subcutaneous TID WC  . Ipratropium-Albuterol  1 puff Inhalation QID  . mouth rinse  15 mL Mouth Rinse BID  . methylPREDNISolone (SOLU-MEDROL) injection  40 mg Intravenous Q8H  . pantoprazole  40 mg Oral Daily  . pravastatin  40 mg Oral Daily  . sodium chloride flush  3 mL Intravenous Q12H  . zinc  sulfate  220 mg Oral Daily   Continuous Infusions:   LOS: 9 days        Curtis Rosales Annett Gula, MD

## 2020-12-03 NOTE — Progress Notes (Signed)
Occupational Therapy Treatment Patient Details Name: Curtis Rosales MRN: [id number] DOB: 08/10/1961 Today's Date: 12/03/2020    History of present illness Pt is 60 year old male unvaccinated for COVID-19 with past medical history for coronary artery disease, ischemic cardiomyopathy who tested positive for COVID-19 11/18/2020. Now admitted with diagnosis of COVID pneumonia.   OT comments  Patient mod I for bed mobility and min G for transfer to recliner due to mild unsteadiness. Pt doffed PNRB mask for transfer, on 9L HFNC however pt hyperventilating once to chair with mod cues for PLB. Patient requesting use of PNRB to recover, however was unable to monitor O2 sats due to O2 reading not available on heart monitor screen at all. Notified RN. Limited session today due to fatigue with transfer and arrival of breakfast tray, will continue to follow to maximize patient activity tolerance and decrease O2 needs in order to D/C home.   Follow Up Recommendations  No OT follow up    Equipment Recommendations  None recommended by OT       Precautions / Restrictions Precautions Precautions: Other (comment) Precaution Comments: Monitor Sats.       Mobility Bed Mobility Overal bed mobility: Modified Independent                Transfers Overall transfer level: Needs assistance Equipment used: None Transfers: Sit to/from Stand Sit to Stand: Min guard         General transfer comment: mild unsteadiness with standing, min G for safety    Balance Overall balance assessment: Mild deficits observed, not formally tested                                         ADL either performed or assessed with clinical judgement   ADL Overall ADL's : Needs assistance/impaired Eating/Feeding: Independent                       Toilet Transfer: Min Manufacturing systems engineer Details (indicate cue type and reason): to recliner, min G for safety due to mild usnteadiness  with standing         Functional mobility during ADLs: Min guard General ADL Comments: limited session 2* to patient's lunch arriving. decreased activity tolerance needing PNRB to recover in addition to 9L HFNC. unable to obtain O2 reading from monitor notified RN               Cognition Arousal/Alertness: Awake/alert Behavior During Therapy: [facility] for tasks assessed/performed;Anxious Overall Cognitive Status: Within Functional Limits for tasks assessed                                 General Comments: anxious after mobility, otherwise [facility]                   Pertinent Vitals/ Pain       Pain Assessment: No/denies pain         Frequency  Min 2X/week        Progress Toward Goals  OT Goals(current goals can now be found in the care plan section)  Progress towards OT goals: Progressing toward goals  Acute Rehab OT Goals Patient Stated Goal: To get off oxygen OT Goal Formulation: With patient Time For Goal Achievement: 12/12/20 Potential to Achieve Goals: Good  Plan Discharge plan remains appropriate  AM-PAC OT "6 Clicks" Daily Activity     Outcome Measure   Help from another person eating meals?: None Help from another person taking care of personal grooming?: A Little Help from another person toileting, which includes using toliet, bedpan, or urinal?: A Little Help from another person bathing (including washing, rinsing, drying)?: A Little Help from another person to put on and taking off regular upper body clothing?: A Little Help from another person to put on and taking off regular lower body clothing?: A Little 6 Click Score: 19    End of Session Equipment Utilized During Treatment: Oxygen  OT Visit Diagnosis: Other (comment) (COVID PNA)   Activity Tolerance Patient limited by fatigue   Patient Left in chair;with call bell/phone within reach   Nurse Communication Mobility status;Other (comment) (O2 not showing on monitor)         Time: 1141-1155 OT Time Calculation (min): 14 min  Charges: OT General Charges $OT Visit: 1 Visit OT Treatments $Self Care/Home Management : 8-22 mins  Marlyce Huge OT OT pager: [phone]   Carmelia Roller 12/03/2020, 1:18 PM

## 2020-12-04 LAB — D-DIMER, QUANTITATIVE: D-Dimer, Quant: 0.64 ug/mL-FEU — ABNORMAL HIGH (ref 0.00–0.50)

## 2020-12-04 LAB — COMPREHENSIVE METABOLIC PANEL
ALT: 37 U/L (ref 0–44)
AST: 18 U/L (ref 15–41)
Albumin: 2.7 g/dL — ABNORMAL LOW (ref 3.5–5.0)
Alkaline Phosphatase: 48 U/L (ref 38–126)
Anion gap: 11 (ref 5–15)
BUN: 37 mg/dL — ABNORMAL HIGH (ref 6–20)
CO2: 24 mmol/L (ref 22–32)
Calcium: 8.6 mg/dL — ABNORMAL LOW (ref 8.9–10.3)
Chloride: 101 mmol/L (ref 98–111)
Creatinine, Ser: 0.79 mg/dL (ref 0.61–1.24)
GFR, Estimated: >60 mL/min (ref >60)
Glucose, Bld: 183 mg/dL — ABNORMAL HIGH (ref 70–99)
Potassium: 4.6 mmol/L (ref 3.5–5.1)
Sodium: 136 mmol/L (ref 135–145)
Total Bilirubin: 0.8 mg/dL (ref 0.3–1.2)
Total Protein: 6.4 g/dL — ABNORMAL LOW (ref 6.5–8.1)

## 2020-12-04 LAB — GLUCOSE, CAPILLARY
Glucose-Capillary: 197 mg/dL — ABNORMAL HIGH (ref 70–99)
Glucose-Capillary: 250 mg/dL — ABNORMAL HIGH (ref 70–99)
Glucose-Capillary: 177 mg/dL — ABNORMAL HIGH (ref 70–99)
Glucose-Capillary: 196 mg/dL — ABNORMAL HIGH (ref 70–99)

## 2020-12-04 LAB — C-REACTIVE PROTEIN: CRP: 0.7 mg/dL (ref <1.0)

## 2020-12-04 LAB — FERRITIN: Ferritin: 413 ng/mL — ABNORMAL HIGH (ref 24–336)

## 2020-12-04 MED ORDER — POLYETHYLENE GLYCOL 3350 17 G PO PACK
17.0000 g | PACK | Freq: Every day | ORAL | Status: DC
  Administered 2020-12-04 – 2020-12-14 (×10): 17 g via ORAL
  Filled 2020-12-04 (×11): qty 1

## 2020-12-04 NOTE — Progress Notes (Signed)
PROGRESS NOTE    Curtis Rosales  YHC:[id number] DOB: Oct 29, 1961 DOA: 11/24/2020 PCP: Ambrose Mantle, MD    Brief Narrative:  Curtis Rosales is a 60 year old male with past medical history significant for CAD, ischemic cardiomyopathy, essential hypertension, hyperlipidemia who presents to the ED with body aches, cough, fevers and progressive shortness of breath.  Patient is unvaccinated against Covid-19 with positive Covid-19 test on 11/18/2020.  Patient received steroids outpatient with no improvement of symptoms.  In the ED, temperature 21.3 F, RR 33, HR 102, BP 137/86, SPO2 93% on oxygen.  Sodium 133, potassium 4.1, chloride 101, CO2 20, glucose 107, BUN 22, creatinine 1.06.  CRP 4.4, lactic acid 1.7, procalcitonin less than 0.10.  WBC 6.5, hemoglobin 15.1, platelets 228.  D-dimer 0.57.  Hospital service was consulted for further evaluation and management of acute hypoxic respiratory failure secondary to Covid-19 viral pneumonia.   Assessment & Plan:   Principal Problem:   Pneumonia due to COVID-19 virus Active Problems:   Cardiomyopathy, ischemic   CAD (coronary artery disease)   Pure hypercholesterolemia   Acute respiratory disease due to COVID-19 virus   Acute respiratory failure with hypoxia (HCC)   Acute hypoxic respiratory failure secondary to acute Covid-19 viral pneumonia during the ongoing Covid 19 Pandemic - POA Patient presenting with progressive shortness of breath associated with body aches, fever, chills.  Initially tested positive for Covid-19 on 11/18/2020, attempted treatment outpatient with steroids unsuccessful with progression of disease process.  Patient was noted to be hypoxic on presentation requiring submental oxygen. --COVID test: 11/18/2020 --CRP 4.4>3.5>0.7>7.9>11.7>4.5>1.3>0.7 --ddimer 0.57>>0.63>3.56>0.75>0.76>0.64 --Completed 5-day course of remdesivir on 11/28/2020 --Continue Solumedrol 40mg  IV q8h --prone for 2-3hrs every 12hrs if able --Continue supplemental  oxygen, titrate to maintain SPO2 greater than 92%, on 9L HFNC and 15L NRB --Continue supportive care with Combivent MDI prn, vitamin C, zinc, Tylenol, antitussives  --Follow CBC, CMP, D-dimer, ferritin, and CRP daily --Continue airborne/contact isolation precautions   The treatment plan and use of medications and known side effects were discussed with patient/family. Some of the medications used are based on case reports/anecdotal data.  All other medications being used in the management of COVID-19 based on limited study data.  Complete risks and long-term side effects are unknown, however in the best clinical judgment they seem to be of some benefit.  Patient wanted to proceed with treatment options provided.  Essential hypertension Ischemic cardiomyopathy --Carvedilol 3.125 mg p.o. twice daily --Aspirin 81 mg p.o. daily and statin  HLD: Pravastatin 40 mg p.o. daily  GERD: Protonix 40 mg p.o. daily   DVT prophylaxis: Lovenox   Code Status: Full Code Family Communication: Updated patient spouse, Misty Stanley via telephone this afternoon.  Disposition Plan:  Level of care: Progressive Status is: Inpatient  Remains inpatient appropriate because:Ongoing diagnostic testing needed not appropriate for outpatient work up, Unsafe d/c plan, IV treatments appropriate due to intensity of illness or inability to take PO and Inpatient level of care appropriate due to severity of illness   Dispo: The patient is from: Home              Anticipated d/c is to: Home              Anticipated d/c date is: > 3 days              Patient currently is not medically stable to d/c.   Difficult to place patient Yes    Consultants:   none  Procedures:   none  Antimicrobials:  none   Subjective: Patient seen and examined at bedside, resting comfortably.  Continues with shortness of breath, especially with any type of minimal exertion.  Continues on high flow nasal cannula and NRB.  No other questions  or concerns at this time.  Updated patient spouse via telephone this afternoon.  Denies headache, no visual changes, no chest pain, no current fever/chills/night sweats, no nausea/vomiting/diarrhea, no abdominal pain.  No acute events overnight per nursing staff.  Objective: Vitals:   12/04/20 0350 12/04/20 0500 12/04/20 0819 12/04/20 1426  BP:    118/81  Pulse: (!) 57  60 71  Resp: 18 18 14  (!) 22  Temp:    97.6 F (36.4 C)  TempSrc:    Oral  SpO2: 96%  94% 95%  Weight:      Height:        Intake/Output Summary (Last 24 hours) at 12/04/2020 1527 Last data filed at 12/04/2020 1100 Gross per 24 hour  Intake 240 ml  Output 800 ml  Net -560 ml   Filed Weights   11/26/20 1120  Weight: 94.3 kg    Examination:  General exam: Appears calm and comfortable, chronically ill appearance Respiratory system: Clear to auscultation. Respiratory effort normal.  No accessory muscle use.  On 9 L high flow nasal cannula with 15 L NRB. Cardiovascular system: S1 & S2 heard, RRR. No JVD, murmurs, rubs, gallops or clicks. No pedal edema. Gastrointestinal system: Abdomen is nondistended, soft and nontender. No organomegaly or masses felt. Normal bowel sounds heard. Central nervous system: Alert and oriented. No focal neurological deficits. Extremities: Symmetric 5 x 5 power. Skin: No rashes, lesions or ulcers Psychiatry: Judgement and insight appear normal. Mood & affect appropriate.     Data Reviewed: I have personally reviewed following labs and imaging studies  CBC: No results for input(s): WBC, NEUTROABS, HGB, HCT, MCV, PLT in the last 168 hours. Basic Metabolic Panel: Recent Labs  Lab 11/30/20 0348 12/01/20 0331 12/02/20 0434 12/03/20 0438 12/04/20 0458  NA 138 135 136 138 136  K 4.0 4.9 4.5 5.0 4.6  CL 101 101 101 101 101  CO2 26 24 23 27 24   GLUCOSE 147* 192* 182* 193* 183*  BUN 29* 25* 34* 42* 37*  CREATININE 0.75 0.81 0.83 1.01 0.79  CALCIUM 8.7* 8.5* 8.7* 8.8* 8.6*    GFR: Estimated Creatinine Clearance: 110.8 mL/min (by C-G formula based on SCr of 0.79 mg/dL). Liver Function Tests: Recent Labs  Lab 11/30/20 0348 12/01/20 0331 12/02/20 0434 12/03/20 0438 12/04/20 0458  AST 21 17 21 17 18   ALT 47* 35 40 37 37  ALKPHOS 53 45 50 51 48  BILITOT 1.2 1.0 1.0 0.7 0.8  PROT 6.5 6.3* 6.4* 6.6 6.4*  ALBUMIN 3.2* 2.9* 2.8* 2.9* 2.7*   No results for input(s): LIPASE, AMYLASE in the last 168 hours. No results for input(s): AMMONIA in the last 168 hours. Coagulation Profile: No results for input(s): INR, PROTIME in the last 168 hours. Cardiac Enzymes: No results for input(s): CKTOTAL, CKMB, CKMBINDEX, TROPONINI in the last 168 hours. BNP (last 3 results) No results for input(s): PROBNP in the last 8760 hours. HbA1C: No results for input(s): HGBA1C in the last 72 hours. CBG: Recent Labs  Lab 12/03/20 1142 12/03/20 1622 12/03/20 2123 12/04/20 0811 12/04/20 1106  GLUCAP 257* 185* 179* 197* 250*   Lipid Profile: No results for input(s): CHOL, HDL, LDLCALC, TRIG, CHOLHDL, LDLDIRECT in the last 72 hours. Thyroid Function Tests: No results for input(s):  TSH, T4TOTAL, FREET4, T3FREE, THYROIDAB in the last 72 hours. Anemia Panel: Recent Labs    12/03/20 0438 12/04/20 0458  FERRITIN 430* 413*   Sepsis Labs: No results for input(s): PROCALCITON, LATICACIDVEN in the last 168 hours.  Recent Results (from the past 240 hour(s))  Blood Culture (routine x 2)     Status: None   Collection Time: 11/24/20  7:07 PM   Specimen: BLOOD  Result Value Ref Range Status   Specimen Description   Final    BLOOD RIGHT ARM Performed at [facility], [address], [place], [place] [address]    Special Requests   Final    BOTTLES DRAWN AEROBIC AND ANAEROBIC Blood Culture adequate volume Performed at [facility], [address], [place], [place] [address]    Culture   Final    NO GROWTH 5 DAYS Performed at [facility], [address] [place], [place] [address]    Report Status 11/29/2020 FINAL  Final  Blood Culture (routine x 2)     Status: None   Collection Time: 11/24/20  7:17 PM   Specimen: BLOOD  Result Value Ref Range Status   Specimen Description   Final    BLOOD LEFT ANTECUBITAL Performed at [facility], [address], [place], [place] [address]    Special Requests   Final    BOTTLES DRAWN AEROBIC AND ANAEROBIC Blood Culture adequate volume Performed at [facility], [address], [place], [place] [address]    Culture   Final    NO GROWTH 5 DAYS Performed at [facility], [address], [place], [place] [address]    Report Status 11/29/2020 FINAL  Final  Expectorated sputum assessment w rflx to resp cult     Status: None   Collection Time: 11/24/20  9:28 PM   Specimen: Sputum  Result Value Ref Range Status   Specimen Description SPUTUM  Final   Special Requests Normal  Final   Sputum evaluation   Final    THIS SPECIMEN IS ACCEPTABLE FOR SPUTUM CULTURE Performed at [facility], [address], [place], [place] [address]    Report Status 11/24/2020 FINAL  Final  Culture, respiratory     Status: None   Collection Time: 11/24/20  9:28 PM   Specimen: SPU  Result Value Ref Range Status   Specimen Description   Final    SPUTUM Performed at [facility], [address], [place], [place] [address]    Special Requests   Final    Normal Reflexed from [id number] Performed at [facility], [address] [place], [place] [address]    Gram Stain   Final    RARE WBC PRESENT, PREDOMINANTLY PMN ABUNDANT GRAM NEGATIVE RODS FEW GRAM POSITIVE COCCI IN PAIRS IN CLUSTERS    Culture   Final    ABUNDANT NORMAL SKIN FLORA No Pseudomonas species isolated Performed at [facility], [address], [place], [place] [address]    Report Status 11/27/2020 FINAL  Final  MRSA PCR  Screening     Status: None   Collection Time: 11/25/20 12:01 AM   Specimen: Nasal Mucosa; Nasopharyngeal  Result Value Ref Range Status   MRSA by PCR NEGATIVE NEGATIVE Final    Comment:        The GeneXpert MRSA Assay (FDA approved for NASAL specimens only), is one component of a comprehensive MRSA colonization surveillance  program. It is not intended to diagnose MRSA infection nor to guide or monitor treatment for MRSA infections. Performed at [facility], [address], [place], [place] [address]          Radiology Studies: CT ANGIO CHEST PE W OR WO CONTRAST  Result Date: 12/03/2020 CLINICAL DATA:  60 year old male with concern for pulmonary embolism. EXAM: CT ANGIOGRAPHY CHEST WITH CONTRAST TECHNIQUE: Multidetector CT imaging of the chest was performed using the standard protocol during bolus administration of intravenous contrast. Multiplanar CT image reconstructions and MIPs were obtained to evaluate the vascular anatomy. CONTRAST:  OMNIPAQUE IOHEXOL 350 MG/ML SOLN COMPARISON:  Chest radiograph dated 12/01/2020. FINDINGS: Cardiovascular: There is no cardiomegaly or pericardial effusion. Coronary vascular calcification of the LAD. The thoracic aorta is unremarkable. Evaluation of the pulmonary arteries is limited due to respiratory motion artifact. No pulmonary artery embolus identified. Mediastinum/Nodes: There is no hilar or mediastinal adenopathy. There is a small hiatal hernia. The esophagus is grossly unremarkable. No mediastinal fluid collection. Lungs/Pleura: Bilateral patchy ground-glass opacities consistent with multilobar pneumonia, likely viral or atypical in etiology including COVID-19. Clinical correlation is recommended. No pleural effusion pneumothorax. The central airways are patent. Upper Abdomen: No acute abnormality. Musculoskeletal: No chest wall abnormality. No acute or significant osseous findings. Review of the MIP images confirms the  above findings. IMPRESSION: 1. No CT evidence of pulmonary embolism. 2. Multilobar pneumonia, likely viral or atypical in etiology including COVID-19. Clinical correlation is recommended. Electronically Signed   By: Elgie Collard M.D.   On: 12/03/2020 18:56        Scheduled Meds: . vitamin C  500 mg Oral Daily  . aspirin EC  81 mg Oral Daily  . carvedilol  3.125 mg Oral BID  . chlorpheniramine-HYDROcodone  5 mL Oral Q12H  . enoxaparin (LOVENOX) injection  40 mg Subcutaneous Q24H  . insulin aspart  0-9 Units Subcutaneous TID WC  . Ipratropium-Albuterol  1 puff Inhalation QID  . mouth rinse  15 mL Mouth Rinse BID  . methylPREDNISolone (SOLU-MEDROL) injection  40 mg Intravenous Q8H  . pantoprazole  40 mg Oral Daily  . polyethylene glycol  17 g Oral Daily  . pravastatin  40 mg Oral Daily  . sodium chloride flush  3 mL Intravenous Q12H  . zinc sulfate  220 mg Oral Daily   Continuous Infusions:   LOS: 10 days    Time spent: 41 minutes spent on chart review, discussion with nursing staff, consultants, updating family and interview/physical exam; more than 50% of that time was spent in counseling and/or coordination of care.    Alvira Philips [place], DO [facility] Available via Epic secure chat 7am-7pm After these hours, please refer to coverage provider listed on [email or web address] 12/04/2020, 3:27 PM

## 2020-12-04 NOTE — Plan of Care (Signed)

## 2020-12-04 NOTE — Progress Notes (Signed)
Physical Therapy Treatment Patient Details Name: Curtis Rosales MRN: [id number] DOB: November 20, 1960 Today's Date: 12/04/2020    History of Present Illness Pt is 60 year old male unvaccinated for COVID-19 with past medical history for coronary artery disease, ischemic cardiomyopathy who tested positive for COVID-19 11/18/2020. Now admitted with diagnosis of COVID pneumonia.    PT Comments    Pt just transferred to chair with nursing, but agreeable to exercises. Pt desats with seated BLE strengthening exercises to 84% with 9L HFNC and 15L NRB, recovers to 90% after ~3 minutes seated rest; desats to 77% after 5 STS reps, recovers to 91% after ~3 minutes seated rest with pursed lip breathing cues. Pt tends to mouth breath, improves sats with cues for pursed lip breathing. Pt fatigued after STS reps, ambulation not attempted. RN notified of O2 with mobility. Pt will benefit from continued physical therapy in hospital and recommendations below to increase strength, balance, endurance for safe ADLs and gait.    Follow Up Recommendations  Home health PT;Supervision for mobility/OOB     Equipment Recommendations  None recommended by PT    Recommendations for Other Services       Precautions / Restrictions Precautions Precautions: Other (comment) Precaution Comments: Monitor Sats Restrictions Weight Bearing Restrictions: No    Mobility  Bed Mobility  General bed mobility comments: in chair upon arrival  Transfers Overall transfer level: Needs assistance Equipment used: None Transfers: Sit to/from Stand Sit to Stand: Supervision  General transfer comment: Pt performs 5 reps from chair with light UE assist, desats to 77% after 5 reps with 9L HFNC and 15L NRB, returns to 91% after 3 minutes of seated rest with pursed lip breathing  Ambulation/Gait  General Gait Details: too fatigued after STS reps   Stairs             Wheelchair Mobility    Modified Rankin (Stroke Patients Only)        Balance Overall balance assessment: No apparent balance deficits (not formally assessed)     Cognition Arousal/Alertness: Awake/alert Behavior During Therapy: WFL for tasks assessed/performed Overall Cognitive Status: Within Functional Limits for tasks assessed     Exercises General Exercises - Lower Extremity Long Arc Quad: Seated;AROM;Strengthening;Both;15 reps Hip Flexion/Marching: Seated;AROM;Strengthening;Both;15 reps    General Comments        Pertinent Vitals/Pain Pain Assessment: No/denies pain    Home Living                      Prior Function            PT Goals (current goals can now be found in the care plan section) Acute Rehab PT Goals Patient Stated Goal: To get off oxygen PT Goal Formulation: With patient Time For Goal Achievement: 12/12/20 Potential to Achieve Goals: Good Progress towards PT goals: Progressing toward goals    Frequency    Min 3X/week      PT Plan Current plan remains appropriate    Co-evaluation              AM-PAC PT "6 Clicks" Mobility   Outcome Measure  Help needed turning from your back to your side while in a flat bed without using bedrails?: None Help needed moving from lying on your back to sitting on the side of a flat bed without using bedrails?: None Help needed moving to and from a bed to a chair (including a wheelchair)?: A Little Help needed standing up from a chair using your arms (e.g.,  wheelchair or bedside chair)?: None Help needed to walk in hospital room?: A Little Help needed climbing 3-5 steps with a railing? : A Little 6 Click Score: 21    End of Session Equipment Utilized During Treatment: Oxygen Activity Tolerance: Patient tolerated treatment well Patient left: in chair;with call bell/phone within reach Nurse Communication: Mobility status;Other (comment) (O2) PT Visit Diagnosis: Difficulty in walking, not elsewhere classified (R26.2)     Time: 1751-0258 PT Time  Calculation (min) (ACUTE ONLY): 20 min  Charges:  $Therapeutic Exercise: 8-22 mins                      [name] PT, DPT 12/04/20, 12:55 PM

## 2020-12-05 LAB — COMPREHENSIVE METABOLIC PANEL
ALT: 40 U/L (ref 0–44)
AST: 19 U/L (ref 15–41)
Albumin: 2.7 g/dL — ABNORMAL LOW (ref 3.5–5.0)
Alkaline Phosphatase: 45 U/L (ref 38–126)
Anion gap: 9 (ref 5–15)
BUN: 33 mg/dL — ABNORMAL HIGH (ref 6–20)
CO2: 25 mmol/L (ref 22–32)
Calcium: 8.6 mg/dL — ABNORMAL LOW (ref 8.9–10.3)
Chloride: 102 mmol/L (ref 98–111)
Creatinine, Ser: 0.88 mg/dL (ref 0.61–1.24)
GFR, Estimated: >60 mL/min (ref >60)
Glucose, Bld: 192 mg/dL — ABNORMAL HIGH (ref 70–99)
Potassium: 5.3 mmol/L — ABNORMAL HIGH (ref 3.5–5.1)
Sodium: 136 mmol/L (ref 135–145)
Total Bilirubin: 0.8 mg/dL (ref 0.3–1.2)
Total Protein: 6.1 g/dL — ABNORMAL LOW (ref 6.5–8.1)

## 2020-12-05 LAB — GLUCOSE, CAPILLARY
Glucose-Capillary: 185 mg/dL — ABNORMAL HIGH (ref 70–99)
Glucose-Capillary: 234 mg/dL — ABNORMAL HIGH (ref 70–99)
Glucose-Capillary: 197 mg/dL — ABNORMAL HIGH (ref 70–99)
Glucose-Capillary: 195 mg/dL — ABNORMAL HIGH (ref 70–99)

## 2020-12-05 LAB — C-REACTIVE PROTEIN: CRP: 0.6 mg/dL (ref <1.0)

## 2020-12-05 LAB — D-DIMER, QUANTITATIVE: D-Dimer, Quant: 0.42 ug/mL-FEU (ref 0.00–0.50)

## 2020-12-05 MED ORDER — METHYLPREDNISOLONE SODIUM SUCC 40 MG IJ SOLR
40.0000 mg | Freq: Two times a day (BID) | INTRAMUSCULAR | Status: DC
  Administered 2020-12-05 – 2020-12-07 (×3): 40 mg via INTRAVENOUS
  Filled 2020-12-05 (×3): qty 1

## 2020-12-05 NOTE — Progress Notes (Signed)
PROGRESS NOTE    Curtis Rosales  QMV:[id number] DOB: 1961-02-07 DOA: 11/24/2020 PCP: Ambrose Mantle, MD    Brief Narrative:  Curtis Rosales is a 60 year old male with past medical history significant for CAD, ischemic cardiomyopathy, essential hypertension, hyperlipidemia who presents to the ED with body aches, cough, fevers and progressive shortness of breath.  Patient is unvaccinated against Covid-19 with positive Covid-19 test on 11/18/2020.  Patient received steroids outpatient with no improvement of symptoms.  In the ED, temperature 21.3 F, RR 33, HR 102, BP 137/86, SPO2 93% on oxygen.  Sodium 133, potassium 4.1, chloride 101, CO2 20, glucose 107, BUN 22, creatinine 1.06.  CRP 4.4, lactic acid 1.7, procalcitonin less than 0.10.  WBC 6.5, hemoglobin 15.1, platelets 228.  D-dimer 0.57.  Hospital service was consulted for further evaluation and management of acute hypoxic respiratory failure secondary to Covid-19 viral pneumonia.   Assessment & Plan:   Principal Problem:   Pneumonia due to COVID-19 virus Active Problems:   Cardiomyopathy, ischemic   CAD (coronary artery disease)   Pure hypercholesterolemia   Acute respiratory disease due to COVID-19 virus   Acute respiratory failure with hypoxia (HCC)   Acute hypoxic respiratory failure secondary to acute Covid-19 viral pneumonia during the ongoing Covid 19 Pandemic - POA Patient presenting with progressive shortness of breath associated with body aches, fever, chills.  Initially tested positive for Covid-19 on 11/18/2020, attempted treatment outpatient with steroids unsuccessful with progression of disease process.  Patient was noted to be hypoxic on presentation requiring submental oxygen. --COVID test: 11/18/2020 --CRP 4.4>3.5>0.7>7.9>11.7>4.5>1.3>0.7>0.6 --ddimer 0.57>>0.63>3.56>0.75>0.76>0.64>0.42 --Completed 5-day course of remdesivir on 11/28/2020 --decrease Solumedrol 40mg  IV from q8h to q12h --prone for 2-3hrs every 12hrs if  able --Continue supplemental oxygen, titrate to maintain SPO2 greater than 92%, on 9L HFNC and 15L NRB --Continue supportive care with Combivent MDI prn, vitamin C, zinc, Tylenol, antitussives  --Follow CBC, CMP, D-dimer, and CRP daily --Continue airborne/contact isolation precautions   The treatment plan and use of medications and known side effects were discussed with patient/family. Some of the medications used are based on case reports/anecdotal data.  All other medications being used in the management of COVID-19 based on limited study data.  Complete risks and long-term side effects are unknown, however in the best clinical judgment they seem to be of some benefit.  Patient wanted to proceed with treatment options provided.  Essential hypertension Ischemic cardiomyopathy --Carvedilol 3.125 mg p.o. twice daily --Aspirin 81 mg p.o. daily and statin  HLD: Pravastatin 40 mg p.o. daily  GERD: Protonix 40 mg p.o. daily   DVT prophylaxis: Lovenox   Code Status: Full Code Family Communication: Updated patient spouse, via telephone this afternoon.  Disposition Plan:  Level of care: Progressive Status is: Inpatient  Remains inpatient appropriate because:Ongoing diagnostic testing needed not appropriate for outpatient work up, Unsafe d/c plan, IV treatments appropriate due to intensity of illness or inability to take PO and Inpatient level of care appropriate due to severity of illness   Dispo: The patient is from: Home              Anticipated d/c is to: Home              Anticipated d/c date is: > 3 days              Patient currently is not medically stable to d/c.   Difficult to place patient Yes    Consultants:   none  Procedures:   none  Antimicrobials:   none   Subjective: Patient seen and examined at bedside, resting comfortably.  Continues on 9 L high flow nasal cannula and 15 L NRB.  Continues with mild shortness of breath.  No other specific complaints or  concerns at this time. Denies headache, no visual changes, no chest pain, no current fever/chills/night sweats, no nausea/vomiting/diarrhea, no abdominal pain.  No acute events overnight per nursing staff.  Objective: Vitals:   12/05/20 0514 12/05/20 0800 12/05/20 0934 12/05/20 1315  BP: 117/81   117/79  Pulse: 65   68  Resp: 18   16  Temp: 97.9 F (36.6 C)   97.8 F (36.6 C)  TempSrc: Oral   Oral  SpO2: 94% 93% 92% 95%  Weight:      Height:        Intake/Output Summary (Last 24 hours) at 12/05/2020 1338 Last data filed at 12/04/2020 1500 Gross per 24 hour  Intake 240 ml  Output 750 ml  Net -510 ml   Filed Weights   11/26/20 1120  Weight: 94.3 kg    Examination:  General exam: Appears calm and comfortable, chronically ill appearance Respiratory system: Clear to auscultation. Respiratory effort normal.  No accessory muscle use.  On 9 L high flow nasal cannula with 15 L NRB. Cardiovascular system: S1 & S2 heard, RRR. No JVD, murmurs, rubs, gallops or clicks. No pedal edema. Gastrointestinal system: Abdomen is nondistended, soft and nontender. No organomegaly or masses felt. Normal bowel sounds heard. Central nervous system: Alert and oriented. No focal neurological deficits. Extremities: Symmetric 5 x 5 power. Skin: No rashes, lesions or ulcers Psychiatry: Judgement and insight appear normal. Mood & affect appropriate.     Data Reviewed: I have personally reviewed following labs and imaging studies  CBC: No results for input(s): WBC, NEUTROABS, HGB, HCT, MCV, PLT in the last 168 hours. Basic Metabolic Panel: Recent Labs  Lab 12/01/20 0331 12/02/20 0434 12/03/20 0438 12/04/20 0458 12/05/20 0421  NA 135 136 138 136 136  K 4.9 4.5 5.0 4.6 5.3*  CL 101 101 101 101 102  CO2 24 23 27 24 25   GLUCOSE 192* 182* 193* 183* 192*  BUN 25* 34* 42* 37* 33*  CREATININE 0.81 0.83 1.01 0.79 0.88  CALCIUM 8.5* 8.7* 8.8* 8.6* 8.6*   GFR: Estimated Creatinine Clearance: 100.7  mL/min (by C-G formula based on SCr of 0.88 mg/dL). Liver Function Tests: Recent Labs  Lab 12/01/20 0331 12/02/20 0434 12/03/20 0438 12/04/20 0458 12/05/20 0421  AST 17 21 17 18 19   ALT 35 40 37 37 40  ALKPHOS 45 50 51 48 45  BILITOT 1.0 1.0 0.7 0.8 0.8  PROT 6.3* 6.4* 6.6 6.4* 6.1*  ALBUMIN 2.9* 2.8* 2.9* 2.7* 2.7*   No results for input(s): LIPASE, AMYLASE in the last 168 hours. No results for input(s): AMMONIA in the last 168 hours. Coagulation Profile: No results for input(s): INR, PROTIME in the last 168 hours. Cardiac Enzymes: No results for input(s): CKTOTAL, CKMB, CKMBINDEX, TROPONINI in the last 168 hours. BNP (last 3 results) No results for input(s): PROBNP in the last 8760 hours. HbA1C: No results for input(s): HGBA1C in the last 72 hours. CBG: Recent Labs  Lab 12/04/20 0811 12/04/20 1106 12/04/20 1650 12/04/20 2105 12/05/20 0755  GLUCAP 197* 250* 177* 196* 185*   Lipid Profile: No results for input(s): CHOL, HDL, LDLCALC, TRIG, CHOLHDL, LDLDIRECT in the last 72 hours. Thyroid Function Tests: No results for input(s): TSH, T4TOTAL, FREET4, T3FREE, THYROIDAB in  the last 72 hours. Anemia Panel: Recent Labs    12/03/20 0438 12/04/20 0458  FERRITIN 430* 413*   Sepsis Labs: No results for input(s): PROCALCITON, LATICACIDVEN in the last 168 hours.  No results found for this or any previous visit (from the past 240 hour(s)).       Radiology Studies: CT ANGIO CHEST PE W OR WO CONTRAST  Result Date: 12/03/2020 CLINICAL DATA:  60 year old male with concern for pulmonary embolism. EXAM: CT ANGIOGRAPHY CHEST WITH CONTRAST TECHNIQUE: Multidetector CT imaging of the chest was performed using the standard protocol during bolus administration of intravenous contrast. Multiplanar CT image reconstructions and MIPs were obtained to evaluate the vascular anatomy. CONTRAST:  OMNIPAQUE IOHEXOL 350 MG/ML SOLN COMPARISON:  Chest radiograph dated 12/01/2020. FINDINGS:  Cardiovascular: There is no cardiomegaly or pericardial effusion. Coronary vascular calcification of the LAD. The thoracic aorta is unremarkable. Evaluation of the pulmonary arteries is limited due to respiratory motion artifact. No pulmonary artery embolus identified. Mediastinum/Nodes: There is no hilar or mediastinal adenopathy. There is a small hiatal hernia. The esophagus is grossly unremarkable. No mediastinal fluid collection. Lungs/Pleura: Bilateral patchy ground-glass opacities consistent with multilobar pneumonia, likely viral or atypical in etiology including COVID-19. Clinical correlation is recommended. No pleural effusion pneumothorax. The central airways are patent. Upper Abdomen: No acute abnormality. Musculoskeletal: No chest wall abnormality. No acute or significant osseous findings. Review of the MIP images confirms the above findings. IMPRESSION: 1. No CT evidence of pulmonary embolism. 2. Multilobar pneumonia, likely viral or atypical in etiology including COVID-19. Clinical correlation is recommended. Electronically Signed   By: Elgie Collard M.D.   On: 12/03/2020 18:56        Scheduled Meds: . vitamin C  500 mg Oral Daily  . aspirin EC  81 mg Oral Daily  . carvedilol  3.125 mg Oral BID  . chlorpheniramine-HYDROcodone  5 mL Oral Q12H  . enoxaparin (LOVENOX) injection  40 mg Subcutaneous Q24H  . insulin aspart  0-9 Units Subcutaneous TID WC  . Ipratropium-Albuterol  1 puff Inhalation QID  . mouth rinse  15 mL Mouth Rinse BID  . methylPREDNISolone (SOLU-MEDROL) injection  40 mg Intravenous Q8H  . pantoprazole  40 mg Oral Daily  . polyethylene glycol  17 g Oral Daily  . pravastatin  40 mg Oral Daily  . sodium chloride flush  3 mL Intravenous Q12H  . zinc sulfate  220 mg Oral Daily   Continuous Infusions:   LOS: 11 days    Time spent: 38 minutes spent on chart review, discussion with nursing staff, consultants, updating family and interview/physical exam; more than  50% of that time was spent in counseling and/or coordination of care.    Alvira Philips [place], DO [facility] Available via Epic secure chat 7am-7pm After these hours, please refer to coverage provider listed on [email or web address] 12/05/2020, 1:38 PM

## 2020-12-05 NOTE — Plan of Care (Signed)

## 2020-12-05 NOTE — TOC Progression Note (Signed)
Transition of Care [facility]) - Progression Note    Patient Details  Name: Curtis Rosales MRN: [id number] Date of Birth: 09/16/1961  [facility] CM/SW Contact  Geni Bers, RN Phone Number: 12/05/2020, 2:22 PM  Clinical Narrative:     Pt would benefit with Outpatient Physical Therapy related to him being self pay. TOC will continue to follow for other discharge needs.  Expected Discharge Plan: Home/Self Care Barriers to Discharge: Continued Medical Work up  Expected Discharge Plan and Services Expected Discharge Plan: Home/Self Care   Discharge Planning Services: CM Consult   Living arrangements for the past 2 months: Single Family Home                                       Social Determinants of Health (SDOH) Interventions    Readmission Risk Interventions No flowsheet data found.

## 2020-12-06 LAB — CBC
HCT: 43 % (ref 39.0–52.0)
Hemoglobin: 14.1 g/dL (ref 13.0–17.0)
MCH: 27.1 pg (ref 26.0–34.0)
MCHC: 32.8 g/dL (ref 30.0–36.0)
MCV: 82.7 fL (ref 80.0–100.0)
Platelets: 493 10*3/uL — ABNORMAL HIGH (ref 150–400)
RBC: 5.2 MIL/uL (ref 4.22–5.81)
RDW: 13.5 % (ref 11.5–15.5)
WBC: 11.8 10*3/uL — ABNORMAL HIGH (ref 4.0–10.5)
nRBC: 0 % (ref 0.0–0.2)

## 2020-12-06 LAB — COMPREHENSIVE METABOLIC PANEL
ALT: 46 U/L — ABNORMAL HIGH (ref 0–44)
AST: 21 U/L (ref 15–41)
Albumin: 2.7 g/dL — ABNORMAL LOW (ref 3.5–5.0)
Alkaline Phosphatase: 48 U/L (ref 38–126)
Anion gap: 10 (ref 5–15)
BUN: 31 mg/dL — ABNORMAL HIGH (ref 6–20)
CO2: 27 mmol/L (ref 22–32)
Calcium: 8.8 mg/dL — ABNORMAL LOW (ref 8.9–10.3)
Chloride: 100 mmol/L (ref 98–111)
Creatinine, Ser: 0.84 mg/dL (ref 0.61–1.24)
GFR, Estimated: >60 mL/min (ref >60)
Glucose, Bld: 162 mg/dL — ABNORMAL HIGH (ref 70–99)
Potassium: 5.3 mmol/L — ABNORMAL HIGH (ref 3.5–5.1)
Sodium: 137 mmol/L (ref 135–145)
Total Bilirubin: 0.8 mg/dL (ref 0.3–1.2)
Total Protein: 6 g/dL — ABNORMAL LOW (ref 6.5–8.1)

## 2020-12-06 LAB — GLUCOSE, CAPILLARY
Glucose-Capillary: 157 mg/dL — ABNORMAL HIGH (ref 70–99)
Glucose-Capillary: 208 mg/dL — ABNORMAL HIGH (ref 70–99)
Glucose-Capillary: 130 mg/dL — ABNORMAL HIGH (ref 70–99)
Glucose-Capillary: 141 mg/dL — ABNORMAL HIGH (ref 70–99)

## 2020-12-06 LAB — D-DIMER, QUANTITATIVE: D-Dimer, Quant: 0.64 ug/mL-FEU — ABNORMAL HIGH (ref 0.00–0.50)

## 2020-12-06 LAB — C-REACTIVE PROTEIN: CRP: 0.5 mg/dL (ref <1.0)

## 2020-12-06 MED ORDER — AYR SALINE NASAL NA GEL
1.0000 "application " | Freq: Two times a day (BID) | NASAL | Status: DC
  Administered 2020-12-06 – 2020-12-15 (×13): 1 via NASAL
  Filled 2020-12-06 (×2): qty 14.1

## 2020-12-06 NOTE — Progress Notes (Signed)
PROGRESS NOTE    Curtis Rosales  HYI:[id number] DOB: June 24, 1961 DOA: 11/24/2020 PCP: Ambrose Mantle, MD    Brief Narrative:  Curtis Rosales is a 60 year old male with past medical history significant for CAD, ischemic cardiomyopathy, essential hypertension, hyperlipidemia who presents to the ED with body aches, cough, fevers and progressive shortness of breath.  Patient is unvaccinated against Covid-19 with positive Covid-19 test on 11/18/2020.  Patient received steroids outpatient with no improvement of symptoms.  In the ED, temperature 21.3 F, RR 33, HR 102, BP 137/86, SPO2 93% on oxygen.  Sodium 133, potassium 4.1, chloride 101, CO2 20, glucose 107, BUN 22, creatinine 1.06.  CRP 4.4, lactic acid 1.7, procalcitonin less than 0.10.  WBC 6.5, hemoglobin 15.1, platelets 228.  D-dimer 0.57.  Hospital service was consulted for further evaluation and management of acute hypoxic respiratory failure secondary to Covid-19 viral pneumonia.   Assessment & Plan:   Principal Problem:   Pneumonia due to COVID-19 virus Active Problems:   Cardiomyopathy, ischemic   CAD (coronary artery disease)   Pure hypercholesterolemia   Acute respiratory disease due to COVID-19 virus   Acute respiratory failure with hypoxia (HCC)   Acute hypoxic respiratory failure secondary to acute Covid-19 viral pneumonia during the ongoing Covid 19 Pandemic - POA Patient presenting with progressive shortness of breath associated with body aches, fever, chills.  Initially tested positive for Covid-19 on 11/18/2020, attempted treatment outpatient with steroids unsuccessful with progression of disease process.  Patient was noted to be hypoxic on presentation requiring submental oxygen. --COVID test: 11/18/2020 --CRP 4.4>3.5>0.7>7.9>11.7>4.5>1.3>0.7>0.6>0.5 --ddimer 0.57>>0.63>3.56>0.75>0.76>0.64>0.42>0.88 --Completed 5-day course of remdesivir on 11/28/2020 --Solumedrol 40mg  IV q12h --prone for 2-3hrs every 12hrs if able --Continue  supplemental oxygen, titrate to maintain SPO2 greater than 92%, on 15L HFNC --Continue supportive care with Combivent MDI prn, vitamin C, zinc, Tylenol, antitussives  --Follow CBC, CMP, D-dimer, and CRP daily --Continue airborne/contact isolation precautions   The treatment plan and use of medications and known side effects were discussed with patient/family. Some of the medications used are based on case reports/anecdotal data.  All other medications being used in the management of COVID-19 based on limited study data.  Complete risks and long-term side effects are unknown, however in the best clinical judgment they seem to be of some benefit.  Patient wanted to proceed with treatment options provided.  Essential hypertension Ischemic cardiomyopathy --Carvedilol 3.125 mg p.o. twice daily --Aspirin 81 mg p.o. daily and statin  HLD: Pravastatin 40 mg p.o. daily  GERD: Protonix 40 mg p.o. daily   DVT prophylaxis: Lovenox   Code Status: Full Code Family Communication: Updated patient spouse, via telephone this afternoon.  Disposition Plan:  Level of care: Progressive Status is: Inpatient  Remains inpatient appropriate because:Ongoing diagnostic testing needed not appropriate for outpatient work up, Unsafe d/c plan, IV treatments appropriate due to intensity of illness or inability to take PO and Inpatient level of care appropriate due to severity of illness   Dispo: The patient is from: Home              Anticipated d/c is to: Home              Anticipated d/c date is: > 3 days              Patient currently is not medically stable to d/c.   Difficult to place patient Yes    Consultants:   none  Procedures:   none  Antimicrobials:   none  Subjective: Patient seen and examined at bedside, resting comfortably.  Has been titrated off of the nonrebreather mask, but continues on 15 L high flow nasal cannula.  No other specific complaints or concerns at this time. Denies  headache, no visual changes, no chest pain, no current fever/chills/night sweats, no nausea/vomiting/diarrhea, no abdominal pain.  No acute events overnight per nursing staff.  Objective: Vitals:   12/05/20 2246 12/06/20 0331 12/06/20 0631 12/06/20 1026  BP: 121/74  125/76 121/82  Pulse: 68 (!) 58 65 76  Resp: 18 12 18    Temp: 98.3 F (36.8 C)  97.6 F (36.4 C)   TempSrc: Oral     SpO2: 93% 94% 91%   Weight:      Height:        Intake/Output Summary (Last 24 hours) at 12/06/2020 1111 Last data filed at 12/06/2020 0905 Gross per 24 hour  Intake 3 ml  Output 1200 ml  Net -1197 ml   Filed Weights   11/26/20 1120  Weight: 94.3 kg    Examination:  General exam: Appears calm and comfortable, chronically ill appearance Respiratory system: Clear to auscultation. Respiratory effort normal.  No accessory muscle use.  On 15 L high flow nasal cannula  Cardiovascular system: S1 & S2 heard, RRR. No JVD, murmurs, rubs, gallops or clicks. No pedal edema. Gastrointestinal system: Abdomen is nondistended, soft and nontender. No organomegaly or masses felt. Normal bowel sounds heard. Central nervous system: Alert and oriented. No focal neurological deficits. Extremities: Symmetric 5 x 5 power. Skin: No rashes, lesions or ulcers Psychiatry: Judgement and insight appear normal. Mood & affect appropriate.     Data Reviewed: I have personally reviewed following labs and imaging studies  CBC: Recent Labs  Lab 12/06/20 0415  WBC 11.8*  HGB 14.1  HCT 43.0  MCV 82.7  PLT 493*   Basic Metabolic Panel: Recent Labs  Lab 12/02/20 0434 12/03/20 0438 12/04/20 0458 12/05/20 0421 12/06/20 0415  NA 136 138 136 136 137  K 4.5 5.0 4.6 5.3* 5.3*  CL 101 101 101 102 100  CO2 23 27 24 25 27   GLUCOSE 182* 193* 183* 192* 162*  BUN 34* 42* 37* 33* 31*  CREATININE 0.83 1.01 0.79 0.88 0.84  CALCIUM 8.7* 8.8* 8.6* 8.6* 8.8*   GFR: Estimated Creatinine Clearance: 105.5 mL/min (by C-G formula  based on SCr of 0.84 mg/dL). Liver Function Tests: Recent Labs  Lab 12/02/20 0434 12/03/20 0438 12/04/20 0458 12/05/20 0421 12/06/20 0415  AST 21 17 18 19 21   ALT 40 37 37 40 46*  ALKPHOS 50 51 48 45 48  BILITOT 1.0 0.7 0.8 0.8 0.8  PROT 6.4* 6.6 6.4* 6.1* 6.0*  ALBUMIN 2.8* 2.9* 2.7* 2.7* 2.7*   No results for input(s): LIPASE, AMYLASE in the last 168 hours. No results for input(s): AMMONIA in the last 168 hours. Coagulation Profile: No results for input(s): INR, PROTIME in the last 168 hours. Cardiac Enzymes: No results for input(s): CKTOTAL, CKMB, CKMBINDEX, TROPONINI in the last 168 hours. BNP (last 3 results) No results for input(s): PROBNP in the last 8760 hours. HbA1C: No results for input(s): HGBA1C in the last 72 hours. CBG: Recent Labs  Lab 12/05/20 0755 12/05/20 1126 12/05/20 1640 12/05/20 2249 12/06/20 0755  GLUCAP 185* 234* 197* 195* 157*   Lipid Profile: No results for input(s): CHOL, HDL, LDLCALC, TRIG, CHOLHDL, LDLDIRECT in the last 72 hours. Thyroid Function Tests: No results for input(s): TSH, T4TOTAL, FREET4, T3FREE, THYROIDAB in the last  72 hours. Anemia Panel: Recent Labs    12/04/20 0458  FERRITIN 413*   Sepsis Labs: No results for input(s): PROCALCITON, LATICACIDVEN in the last 168 hours.  No results found for this or any previous visit (from the past 240 hour(s)).       Radiology Studies: No results found.      Scheduled Meds: . vitamin C  500 mg Oral Daily  . aspirin EC  81 mg Oral Daily  . carvedilol  3.125 mg Oral BID  . chlorpheniramine-HYDROcodone  5 mL Oral Q12H  . enoxaparin (LOVENOX) injection  40 mg Subcutaneous Q24H  . insulin aspart  0-9 Units Subcutaneous TID WC  . Ipratropium-Albuterol  1 puff Inhalation QID  . mouth rinse  15 mL Mouth Rinse BID  . methylPREDNISolone (SOLU-MEDROL) injection  40 mg Intravenous Q12H  . pantoprazole  40 mg Oral Daily  . polyethylene glycol  17 g Oral Daily  . pravastatin  40  mg Oral Daily  . saline  1 application Each Nare Q12H  . sodium chloride flush  3 mL Intravenous Q12H  . zinc sulfate  220 mg Oral Daily   Continuous Infusions:   LOS: 12 days    Time spent: 38 minutes spent on chart review, discussion with nursing staff, consultants, updating family and interview/physical exam; more than 50% of that time was spent in counseling and/or coordination of care.    Alvira Philips [place], DO [facility] Available via Epic secure chat 7am-7pm After these hours, please refer to coverage provider listed on [email or web address] 12/06/2020, 11:11 AM

## 2020-12-06 NOTE — Progress Notes (Signed)
Occupational Therapy Treatment Patient Details Name: Curtis Rosales MRN: [id number] DOB: [birth year] Today's Date: 12/06/2020    History of present illness Pt is 60 year old male unvaccinated for COVID-19 with past medical history for coronary artery disease, ischemic cardiomyopathy who tested positive for COVID-19 11/18/2020. Now admitted with diagnosis of COVID pneumonia.   OT comments  Treatment focused on improving patient's activity tolerance, performing ADL task out of bed and education and encouragement for prone/sidelying positioning, use of breathing devices and techniques in order to reduce oxygenation needs. Patient tolerated 30 sec at sink on 15 L HFNC for oral care task before needing rest break. Dropped to 72%. Needed NRB to recover into 90s. Would drop to low 80s once NRB removed x 5. Cont POC.   Follow Up Recommendations  No OT follow up    Equipment Recommendations  None recommended by OT    Recommendations for Other Services      Precautions / Restrictions Precautions Precaution Comments: Monitor Sats Restrictions Weight Bearing Restrictions: No       Mobility Bed Mobility Overal bed mobility: Modified Independent Bed Mobility: Supine to Sit              Transfers Overall transfer level: Needs assistance Equipment used: None Transfers: Sit to/from Stand;Stand Pivot Transfers Sit to Stand: Min guard Stand pivot transfers: Min guard       General transfer comment: min guard to stand and take steps to sink for grooming and then to recliner. O2 sat dropped to 72% on 15 L HFNC. Needed NRB to recover into 90s. O2 sat dropped twice into low 80s once NRB removed.    Balance Overall balance assessment: No apparent balance deficits (not formally assessed)                                         ADL either performed or assessed with clinical judgement   ADL       Grooming: Standing;Oral care Grooming Details (indicate cue type and reason):  stood at sink approx 30 seconds to perform oral care on 15 L HFNC. Needed sitting rest break.                                     Vision Patient Visual Report: No change from baseline     Perception     Praxis      Cognition Arousal/Alertness: Awake/alert Behavior During Therapy: WFL for tasks assessed/performed Overall Cognitive Status: Within Functional Limits for tasks assessed                                          Exercises Other Exercises Other Exercises: Educated/encouraged patient to perform IS and FV each hr x 10. Other Exercises: Educated patient on diaphragmatic breathing.   Shoulder Instructions       General Comments      Pertinent Vitals/ Pain       Pain Assessment: No/denies pain  Home Living                                          Prior Functioning/Environment  Frequency  Min 2X/week        Progress Toward Goals  OT Goals(current goals can now be found in the care plan section)  Progress towards OT goals: Progressing toward goals  Acute Rehab OT Goals Patient Stated Goal: To get off oxygen OT Goal Formulation: With patient Time For Goal Achievement: 12/12/20 Potential to Achieve Goals: Good  Plan Discharge plan remains appropriate    Co-evaluation                 AM-PAC OT "6 Clicks" Daily Activity     Outcome Measure   Help from another person eating meals?: None Help from another person taking care of personal grooming?: A Little Help from another person toileting, which includes using toliet, bedpan, or urinal?: A Little Help from another person bathing (including washing, rinsing, drying)?: A Little Help from another person to put on and taking off regular upper body clothing?: A Little Help from another person to put on and taking off regular lower body clothing?: A Little 6 Click Score: 19    End of Session Equipment Utilized During Treatment:  Oxygen  OT Visit Diagnosis: Other (comment) (COVID pna)   Activity Tolerance Patient limited by fatigue   Patient Left in chair;with call bell/phone within reach   Nurse Communication  (okay to see per Rn)        Time: 9622-2979 OT Time Calculation (min): 24 min  Charges: OT General Charges $OT Visit: 1 Visit OT Treatments $Self Care/Home Management : 8-22 mins $Therapeutic Activity: 8-22 mins  Curtis Rosales, OTR/L Acute Care Rehab Services  Office [phone] Pager: [phone]    Kelli Churn 12/06/2020, 1:53 PM

## 2020-12-06 NOTE — Plan of Care (Signed)

## 2020-12-06 NOTE — Progress Notes (Signed)
Physical Therapy Treatment Patient Details Name: Curtis Rosales MRN: [id number] DOB: 06/22/1961 Today's Date: 12/06/2020    History of Present Illness Pt is 60 year old male unvaccinated for COVID-19 with past medical history for coronary artery disease, ischemic cardiomyopathy who tested positive for COVID-19 11/18/2020. Now admitted with diagnosis of COVID pneumonia.    PT Comments    Pt making good improvements.  His O2 sats and tolerance improved over OT treatment earlier this morning.  Pt was able to tolerate 30' and 50' ambulation on 15 L HFNC with sats down to 83% and 80% but recovered in <2 mins and stable RR so did not utilize NRB mask.  Continue to progress as able.    Follow Up Recommendations  Outpatient PT (no insurance for [facility])     Equipment Recommendations  None recommended by PT    Recommendations for Other Services       Precautions / Restrictions Precautions Precautions: Other (comment) Precaution Comments: Monitor Sats Restrictions Weight Bearing Restrictions: No    Mobility  Bed Mobility Overal bed mobility: Modified Independent Bed Mobility: Supine to Sit;Sit to Supine;Rolling Rolling: Modified independent (Device/Increase time)   Supine to sit: Modified independent (Device/Increase time) Sit to supine: Modified independent (Device/Increase time)      Transfers Overall transfer level: Needs assistance Equipment used: None Transfers: Sit to/from Stand Sit to Stand: Min guard Stand pivot transfers: Min guard       General transfer comment: Performed x 2  Ambulation/Gait Ambulation/Gait assistance: Min guard Gait Distance (Feet): 50 Feet (30' then 50') Assistive device: None Gait Pattern/deviations: Step-through pattern Gait velocity: decreased   General Gait Details: Steady gait but limited by O2 sats and DOE of 2/4.  Ambulated 30' took 5 min rest break then ambulated 50'.  See vitals below   Stairs             Wheelchair Mobility     Modified Rankin (Stroke Patients Only)       Balance Overall balance assessment: Independent   Sitting balance-Leahy Scale: Normal       Standing balance-Leahy Scale: Good                              Cognition Arousal/Alertness: Awake/alert Behavior During Therapy: WFL for tasks assessed/performed Overall Cognitive Status: Within Functional Limits for tasks assessed                                        Exercises Other Exercises Other Exercises: Educated/encouraged patient to perform IS and FV each hr x 10. He demonstrated IS x 10 up to 1750-2000 mL Other Exercises: Educated patient on diaphragmatic breathing    General Comments General comments (skin integrity, edema, etc.): Pt on 15 L HFNC with sats 94% rest.  Ambulated on 15 L HFNC with sats down to 83% but recovered in 1-2 mins, DOE of 2/4, with controlled resp rate.  On 2nd bout sats down to 80% again recovered quickly.  Had NRB mask available but pt did not need as pt recovered quickly and with controlled resp rate.      Pertinent Vitals/Pain Pain Assessment: No/denies pain    Home Living                      Prior Function  PT Goals (current goals can now be found in the care plan section) Acute Rehab PT Goals Patient Stated Goal: To get off oxygen PT Goal Formulation: With patient Time For Goal Achievement: 12/12/20 Potential to Achieve Goals: Good Progress towards PT goals: Progressing toward goals    Frequency    Min 3X/week      PT Plan Discharge plan needs to be updated    Co-evaluation              AM-PAC PT "6 Clicks" Mobility   Outcome Measure  Help needed turning from your back to your side while in a flat bed without using bedrails?: None Help needed moving from lying on your back to sitting on the side of a flat bed without using bedrails?: None Help needed moving to and from a bed to a chair (including a wheelchair)?: A  Little Help needed standing up from a chair using your arms (e.g., wheelchair or bedside chair)?: A Little Help needed to walk in hospital room?: A Little Help needed climbing 3-5 steps with a railing? : A Little 6 Click Score: 20    End of Session Equipment Utilized During Treatment: Oxygen Activity Tolerance: Patient tolerated treatment well Patient left: in bed;with call bell/phone within reach;with bed alarm set Nurse Communication: Mobility status PT Visit Diagnosis: Difficulty in walking, not elsewhere classified (R26.2)     Time: 5852-7782 PT Time Calculation (min) (ACUTE ONLY): 24 min  Charges:  $Gait Training: 8-22 mins $Therapeutic Activity: 8-22 mins                     Anise Salvo, PT Acute Rehab Services Pager [phone] [facility] [phone]     Rayetta Humphrey 12/06/2020, 5:25 PM

## 2020-12-07 LAB — GLUCOSE, CAPILLARY
Glucose-Capillary: 138 mg/dL — ABNORMAL HIGH (ref 70–99)
Glucose-Capillary: 230 mg/dL — ABNORMAL HIGH (ref 70–99)
Glucose-Capillary: 167 mg/dL — ABNORMAL HIGH (ref 70–99)
Glucose-Capillary: 136 mg/dL — ABNORMAL HIGH (ref 70–99)

## 2020-12-07 LAB — D-DIMER, QUANTITATIVE: D-Dimer, Quant: 0.66 ug{FEU}/mL — ABNORMAL HIGH (ref 0.00–0.50)

## 2020-12-07 LAB — CBC
HCT: 44.3 % (ref 39.0–52.0)
Hemoglobin: 14.8 g/dL (ref 13.0–17.0)
MCH: 28 pg (ref 26.0–34.0)
MCHC: 33.4 g/dL (ref 30.0–36.0)
MCV: 83.9 fL (ref 80.0–100.0)
Platelets: 520 K/uL — ABNORMAL HIGH (ref 150–400)
RBC: 5.28 MIL/uL (ref 4.22–5.81)
RDW: 13.6 % (ref 11.5–15.5)
WBC: 12.3 K/uL — ABNORMAL HIGH (ref 4.0–10.5)
nRBC: 0 % (ref 0.0–0.2)

## 2020-12-07 LAB — COMPREHENSIVE METABOLIC PANEL WITH GFR
ALT: 44 U/L (ref 0–44)
AST: 19 U/L (ref 15–41)
Albumin: 2.8 g/dL — ABNORMAL LOW (ref 3.5–5.0)
Alkaline Phosphatase: 53 U/L (ref 38–126)
Anion gap: 6 (ref 5–15)
BUN: 29 mg/dL — ABNORMAL HIGH (ref 6–20)
CO2: 28 mmol/L (ref 22–32)
Calcium: 8.5 mg/dL — ABNORMAL LOW (ref 8.9–10.3)
Chloride: 99 mmol/L (ref 98–111)
Creatinine, Ser: 0.78 mg/dL (ref 0.61–1.24)
GFR, Estimated: >60 mL/min
Glucose, Bld: 112 mg/dL — ABNORMAL HIGH (ref 70–99)
Potassium: 4.9 mmol/L (ref 3.5–5.1)
Sodium: 133 mmol/L — ABNORMAL LOW (ref 135–145)
Total Bilirubin: 1 mg/dL (ref 0.3–1.2)
Total Protein: 6.1 g/dL — ABNORMAL LOW (ref 6.5–8.1)

## 2020-12-07 LAB — C-REACTIVE PROTEIN: CRP: <0.5 mg/dL (ref <1.0)

## 2020-12-07 MED ORDER — METHYLPREDNISOLONE SODIUM SUCC 40 MG IJ SOLR
40.0000 mg | INTRAMUSCULAR | Status: DC
  Administered 2020-12-08: 40 mg via INTRAVENOUS
  Filled 2020-12-07: qty 1

## 2020-12-07 MED ORDER — SALINE SPRAY 0.65 % NA SOLN
1.0000 | NASAL | Status: DC | PRN
  Administered 2020-12-13 – 2020-12-14 (×2): 1 via NASAL
  Filled 2020-12-07: qty 44

## 2020-12-07 NOTE — Progress Notes (Signed)
PROGRESS NOTE    Curtis Rosales  WUJ:[id number] DOB: Oct 06, 1961 DOA: 11/24/2020 PCP: Ambrose Mantle, MD    Brief Narrative:  Da Authement is a 60 year old male with past medical history significant for CAD, ischemic cardiomyopathy, essential hypertension, hyperlipidemia who presents to the ED with body aches, cough, fevers and progressive shortness of breath.  Patient is unvaccinated against Covid-19 with positive Covid-19 test on 11/18/2020.  Patient received steroids outpatient with no improvement of symptoms.  In the ED, temperature 21.3 F, RR 33, HR 102, BP 137/86, SPO2 93% on oxygen.  Sodium 133, potassium 4.1, chloride 101, CO2 20, glucose 107, BUN 22, creatinine 1.06.  CRP 4.4, lactic acid 1.7, procalcitonin less than 0.10.  WBC 6.5, hemoglobin 15.1, platelets 228.  D-dimer 0.57.  Hospital service was consulted for further evaluation and management of acute hypoxic respiratory failure secondary to Covid-19 viral pneumonia.   Assessment & Plan:   Principal Problem:   Pneumonia due to COVID-19 virus Active Problems:   Cardiomyopathy, ischemic   CAD (coronary artery disease)   Pure hypercholesterolemia   Acute respiratory disease due to COVID-19 virus   Acute respiratory failure with hypoxia (HCC)   Acute hypoxic respiratory failure secondary to acute Covid-19 viral pneumonia during the ongoing Covid 19 Pandemic - POA Patient presenting with progressive shortness of breath associated with body aches, fever, chills.  Initially tested positive for Covid-19 on 11/18/2020, attempted treatment outpatient with steroids unsuccessful with progression of disease process.  Patient was noted to be hypoxic on presentation requiring submental oxygen. --COVID test: 11/18/2020 --CRP 4.4>3.5>0.7>7.9>11.7>4.5>1.3>0.7>0.6>0.5, <0.5 --ddimer 0.57>>0.63>3.56>0.75>0.76>0.64>0.42>0.88>0.66 --Completed 5-day course of remdesivir on 11/28/2020 --Solumedrol 40mg  IV q24h --prone for 2-3hrs every 12hrs if  able --Continue supplemental oxygen, titrate to maintain SPO2 greater than 92%, on 13L HFNC --Continue supportive care with Combivent MDI prn, vitamin C, zinc, Tylenol, antitussives  --Continue airborne/contact isolation precautions   The treatment plan and use of medications and known side effects were discussed with patient/family. Some of the medications used are based on case reports/anecdotal data.  All other medications being used in the management of COVID-19 based on limited study data.  Complete risks and long-term side effects are unknown, however in the best clinical judgment they seem to be of some benefit.  Patient wanted to proceed with treatment options provided.  Essential hypertension Ischemic cardiomyopathy --Carvedilol 3.125 mg p.o. twice daily --Aspirin 81 mg p.o. daily and statin  HLD: Pravastatin 40 mg p.o. daily  GERD: Protonix 40 mg p.o. daily   DVT prophylaxis: Lovenox   Code Status: Full Code Family Communication: Updated patient spouse, via telephone this morning.  Disposition Plan:  Level of care: Progressive Status is: Inpatient  Remains inpatient appropriate because:Ongoing diagnostic testing needed not appropriate for outpatient work up, Unsafe d/c plan, IV treatments appropriate due to intensity of illness or inability to take PO and Inpatient level of care appropriate due to severity of illness   Dispo: The patient is from: Home              Anticipated d/c is to: Home              Anticipated d/c date is: > 3 days              Patient currently is not medically stable to d/c.   Difficult to place patient Yes    Consultants:   none  Procedures:   none  Antimicrobials:   none   Subjective: Patient seen and examined at  bedside, resting comfortably.  Asking where the breakfast trays are.  No other questions or concerns at this time.  Now down to 13 L high flow nasal cannula, remains off nonrebreather.  Discussed further titration  down of steroids.  Denies headache, no visual changes, no chest pain, no current fever/chills/night sweats, no nausea/vomiting/diarrhea, no abdominal pain.  No acute events overnight per nursing staff.  Objective: Vitals:   12/06/20 1321 12/06/20 2042 12/07/20 0505 12/07/20 0700  BP: 123/81 134/75 115/81   Pulse: 70 81 71   Resp: 18 18 (!) 22   Temp: 97.8 F (36.6 C) 98.3 F (36.8 C) 98 F (36.7 C)   TempSrc: Oral Oral Oral   SpO2: 95% 90% 92% (!) 85%  Weight:      Height:        Intake/Output Summary (Last 24 hours) at 12/07/2020 0958 Last data filed at 12/06/2020 2045 Gross per 24 hour  Intake --  Output 1100 ml  Net -1100 ml   Filed Weights   11/26/20 1120  Weight: 94.3 kg    Examination:  General exam: Appears calm and comfortable, chronically ill appearance Respiratory system: Clear to auscultation. Respiratory effort normal.  No accessory muscle use.  On 13 L high flow nasal cannula  Cardiovascular system: S1 & S2 heard, RRR. No JVD, murmurs, rubs, gallops or clicks. No pedal edema. Gastrointestinal system: Abdomen is nondistended, soft and nontender. No organomegaly or masses felt. Normal bowel sounds heard. Central nervous system: Alert and oriented. No focal neurological deficits. Extremities: Symmetric 5 x 5 power. Skin: No rashes, lesions or ulcers Psychiatry: Judgement and insight appear normal. Mood & affect appropriate.     Data Reviewed: I have personally reviewed following labs and imaging studies  CBC: Recent Labs  Lab 12/06/20 0415 12/07/20 0531  WBC 11.8* 12.3*  HGB 14.1 14.8  HCT 43.0 44.3  MCV 82.7 83.9  PLT 493* 520*   Basic Metabolic Panel: Recent Labs  Lab 12/03/20 0438 12/04/20 0458 12/05/20 0421 12/06/20 0415 12/07/20 0531  NA 138 136 136 137 133*  K 5.0 4.6 5.3* 5.3* 4.9  CL 101 101 102 100 99  CO2 27 24 25 27 28   GLUCOSE 193* 183* 192* 162* 112*  BUN 42* 37* 33* 31* 29*  CREATININE 1.01 0.79 0.88 0.84 0.78  CALCIUM 8.8*  8.6* 8.6* 8.8* 8.5*   GFR: Estimated Creatinine Clearance: 110.8 mL/min (by C-G formula based on SCr of 0.78 mg/dL). Liver Function Tests: Recent Labs  Lab 12/03/20 0438 12/04/20 0458 12/05/20 0421 12/06/20 0415 12/07/20 0531  AST 17 18 19 21 19   ALT 37 37 40 46* 44  ALKPHOS 51 48 45 48 53  BILITOT 0.7 0.8 0.8 0.8 1.0  PROT 6.6 6.4* 6.1* 6.0* 6.1*  ALBUMIN 2.9* 2.7* 2.7* 2.7* 2.8*   No results for input(s): LIPASE, AMYLASE in the last 168 hours. No results for input(s): AMMONIA in the last 168 hours. Coagulation Profile: No results for input(s): INR, PROTIME in the last 168 hours. Cardiac Enzymes: No results for input(s): CKTOTAL, CKMB, CKMBINDEX, TROPONINI in the last 168 hours. BNP (last 3 results) No results for input(s): PROBNP in the last 8760 hours. HbA1C: No results for input(s): HGBA1C in the last 72 hours. CBG: Recent Labs  Lab 12/06/20 0755 12/06/20 1133 12/06/20 1654 12/06/20 2045 12/07/20 0743  GLUCAP 157* 208* 130* 141* 138*   Lipid Profile: No results for input(s): CHOL, HDL, LDLCALC, TRIG, CHOLHDL, LDLDIRECT in the last 72 hours. Thyroid Function  Tests: No results for input(s): TSH, T4TOTAL, FREET4, T3FREE, THYROIDAB in the last 72 hours. Anemia Panel: No results for input(s): VITAMINB12, FOLATE, FERRITIN, TIBC, IRON, RETICCTPCT in the last 72 hours. Sepsis Labs: No results for input(s): PROCALCITON, LATICACIDVEN in the last 168 hours.  No results found for this or any previous visit (from the past 240 hour(s)).       Radiology Studies: No results found.      Scheduled Meds: . vitamin C  500 mg Oral Daily  . aspirin EC  81 mg Oral Daily  . carvedilol  3.125 mg Oral BID  . chlorpheniramine-HYDROcodone  5 mL Oral Q12H  . enoxaparin (LOVENOX) injection  40 mg Subcutaneous Q24H  . insulin aspart  0-9 Units Subcutaneous TID WC  . Ipratropium-Albuterol  1 puff Inhalation QID  . mouth rinse  15 mL Mouth Rinse BID  . [START ON 12/08/2020]  methylPREDNISolone (SOLU-MEDROL) injection  40 mg Intravenous Q24H  . pantoprazole  40 mg Oral Daily  . polyethylene glycol  17 g Oral Daily  . pravastatin  40 mg Oral Daily  . saline  1 application Each Nare Q12H  . sodium chloride flush  3 mL Intravenous Q12H  . zinc sulfate  220 mg Oral Daily   Continuous Infusions:   LOS: 13 days    Time spent: 38 minutes spent on chart review, discussion with nursing staff, consultants, updating family and interview/physical exam; more than 50% of that time was spent in counseling and/or coordination of care.    Alvira Philips [place], DO [facility] Available via Epic secure chat 7am-7pm After these hours, please refer to coverage provider listed on [email or web address] 12/07/2020, 9:58 AM

## 2020-12-07 NOTE — Plan of Care (Signed)
Able to wean patient down to 10 liters HFNC on 7 a to 7 p shift, oxygen saturation remains in the low to mid 90s.

## 2020-12-08 LAB — GLUCOSE, CAPILLARY
Glucose-Capillary: 122 mg/dL — ABNORMAL HIGH (ref 70–99)
Glucose-Capillary: 165 mg/dL — ABNORMAL HIGH (ref 70–99)
Glucose-Capillary: 161 mg/dL — ABNORMAL HIGH (ref 70–99)

## 2020-12-08 MED ORDER — IPRATROPIUM-ALBUTEROL 20-100 MCG/ACT IN AERS
1.0000 | INHALATION_SPRAY | Freq: Three times a day (TID) | RESPIRATORY_TRACT | Status: DC
  Administered 2020-12-08 – 2020-12-15 (×22): 1 via RESPIRATORY_TRACT

## 2020-12-08 MED ORDER — PREDNISONE 20 MG PO TABS
40.0000 mg | ORAL_TABLET | Freq: Every day | ORAL | Status: DC
  Administered 2020-12-09 – 2020-12-10 (×2): 40 mg via ORAL
  Filled 2020-12-08 (×2): qty 2

## 2020-12-08 NOTE — Progress Notes (Signed)
PROGRESS NOTE    Curtis Rosales  KKX:[id number] DOB: June 06, 1961 DOA: 11/24/2020 PCP: Ambrose Mantle, MD    Brief Narrative:  Curtis Rosales is a 60 year old male with past medical history significant for CAD, ischemic cardiomyopathy, essential hypertension, hyperlipidemia who presents to the ED with body aches, cough, fevers and progressive shortness of breath.  Patient is unvaccinated against Covid-19 with positive Covid-19 test on 11/18/2020.  Patient received steroids outpatient with no improvement of symptoms.  In the ED, temperature 21.3 F, RR 33, HR 102, BP 137/86, SPO2 93% on oxygen.  Sodium 133, potassium 4.1, chloride 101, CO2 20, glucose 107, BUN 22, creatinine 1.06.  CRP 4.4, lactic acid 1.7, procalcitonin less than 0.10.  WBC 6.5, hemoglobin 15.1, platelets 228.  D-dimer 0.57.  Hospital service was consulted for further evaluation and management of acute hypoxic respiratory failure secondary to Covid-19 viral pneumonia.   Assessment & Plan:   Principal Problem:   Pneumonia due to COVID-19 virus Active Problems:   Cardiomyopathy, ischemic   CAD (coronary artery disease)   Pure hypercholesterolemia   Acute respiratory disease due to COVID-19 virus   Acute respiratory failure with hypoxia (HCC)   Acute hypoxic respiratory failure secondary to acute Covid-19 viral pneumonia during the ongoing Covid 19 Pandemic - POA Patient presenting with progressive shortness of breath associated with body aches, fever, chills.  Initially tested positive for Covid-19 on 11/18/2020, attempted treatment outpatient with steroids unsuccessful with progression of disease process.  Patient was noted to be hypoxic on presentation requiring submental oxygen. --COVID test: 11/18/2020 --CRP 4.4>3.5>0.7>7.9>11.7>4.5>1.3>0.7>0.6>0.5, <0.5 --ddimer 0.57>>0.63>3.56>0.75>0.76>0.64>0.42>0.88>0.66 --Completed 5-day course of remdesivir on 11/28/2020 --Solumedrol 40mg  IV q24h; transition to prednisone 40mg  PO daily on  2/7 --prone for 2-3hrs every 12hrs if able --Continue supplemental oxygen, titrate to maintain SPO2 greater than 92%, on 9L HFNC --Continue supportive care with Combivent MDI prn, vitamin C, zinc, Tylenol, antitussives  --Continue airborne/contact isolation precautions   The treatment plan and use of medications and known side effects were discussed with patient/family. Some of the medications used are based on case reports/anecdotal data.  All other medications being used in the management of COVID-19 based on limited study data.  Complete risks and long-term side effects are unknown, however in the best clinical judgment they seem to be of some benefit.  Patient wanted to proceed with treatment options provided.  Essential hypertension Ischemic cardiomyopathy --Carvedilol 3.125 mg p.o. twice daily --Aspirin 81 mg p.o. daily and statin  HLD: Pravastatin 40 mg p.o. daily  GERD: Protonix 40 mg p.o. daily   DVT prophylaxis: Lovenox   Code Status: Full Code Family Communication: Updated patient spouse, via telephone this morning.  Disposition Plan:  Level of care: Progressive Status is: Inpatient  Remains inpatient appropriate because:Ongoing diagnostic testing needed not appropriate for outpatient work up, Unsafe d/c plan, IV treatments appropriate due to intensity of illness or inability to take PO and Inpatient level of care appropriate due to severity of illness   Dispo: The patient is from: Home              Anticipated d/c is to: Home              Anticipated d/c date is: > 3 days              Patient currently is not medically stable to d/c.   Difficult to place patient Yes    Consultants:   none  Procedures:   none  Antimicrobials:   none  Subjective: Patient seen and examined at bedside, resting comfortably.  No specific concerns this morning.  Now down to 9 L high flow nasal cannula from 13 L yesterday.  Denies headache, no visual changes, no chest pain,  no current fever/chills/night sweats, no nausea/vomiting/diarrhea, no abdominal pain.  No acute events overnight per nursing staff.  Objective: Vitals:   12/08/20 0551 12/08/20 0833 12/08/20 0933 12/08/20 1029  BP: (!) 147/76   107/68  Pulse: 77   80  Resp:      Temp: 98.7 F (37.1 C)   98.2 F (36.8 C)  TempSrc: Oral   Oral  SpO2: 100% (!) 87% 92% 90%  Weight:      Height:        Intake/Output Summary (Last 24 hours) at 12/08/2020 1032 Last data filed at 12/08/2020 1029 Gross per 24 hour  Intake 840 ml  Output 1600 ml  Net -760 ml   Filed Weights   11/26/20 1120  Weight: 94.3 kg    Examination:  General exam: Appears calm and comfortable, chronically ill appearance Respiratory system: Clear to auscultation. Respiratory effort normal.  No accessory muscle use.  On 9 L high flow nasal cannula  Cardiovascular system: S1 & S2 heard, RRR. No JVD, murmurs, rubs, gallops or clicks. No pedal edema. Gastrointestinal system: Abdomen is nondistended, soft and nontender. No organomegaly or masses felt. Normal bowel sounds heard. Central nervous system: Alert and oriented. No focal neurological deficits. Extremities: Symmetric 5 x 5 power. Skin: No rashes, lesions or ulcers Psychiatry: Judgement and insight appear normal. Mood & affect appropriate.     Data Reviewed: I have personally reviewed following labs and imaging studies  CBC: Recent Labs  Lab 12/06/20 0415 12/07/20 0531  WBC 11.8* 12.3*  HGB 14.1 14.8  HCT 43.0 44.3  MCV 82.7 83.9  PLT 493* 520*   Basic Metabolic Panel: Recent Labs  Lab 12/03/20 0438 12/04/20 0458 12/05/20 0421 12/06/20 0415 12/07/20 0531  NA 138 136 136 137 133*  K 5.0 4.6 5.3* 5.3* 4.9  CL 101 101 102 100 99  CO2 27 24 25 27 28   GLUCOSE 193* 183* 192* 162* 112*  BUN 42* 37* 33* 31* 29*  CREATININE 1.01 0.79 0.88 0.84 0.78  CALCIUM 8.8* 8.6* 8.6* 8.8* 8.5*   GFR: Estimated Creatinine Clearance: 110.8 mL/min (by C-G formula based on  SCr of 0.78 mg/dL). Liver Function Tests: Recent Labs  Lab 12/03/20 0438 12/04/20 0458 12/05/20 0421 12/06/20 0415 12/07/20 0531  AST 17 18 19 21 19   ALT 37 37 40 46* 44  ALKPHOS 51 48 45 48 53  BILITOT 0.7 0.8 0.8 0.8 1.0  PROT 6.6 6.4* 6.1* 6.0* 6.1*  ALBUMIN 2.9* 2.7* 2.7* 2.7* 2.8*   No results for input(s): LIPASE, AMYLASE in the last 168 hours. No results for input(s): AMMONIA in the last 168 hours. Coagulation Profile: No results for input(s): INR, PROTIME in the last 168 hours. Cardiac Enzymes: No results for input(s): CKTOTAL, CKMB, CKMBINDEX, TROPONINI in the last 168 hours. BNP (last 3 results) No results for input(s): PROBNP in the last 8760 hours. HbA1C: No results for input(s): HGBA1C in the last 72 hours. CBG: Recent Labs  Lab 12/07/20 0743 12/07/20 1150 12/07/20 1628 12/07/20 1936 12/08/20 0822  GLUCAP 138* 230* 167* 136* 122*   Lipid Profile: No results for input(s): CHOL, HDL, LDLCALC, TRIG, CHOLHDL, LDLDIRECT in the last 72 hours. Thyroid Function Tests: No results for input(s): TSH, T4TOTAL, FREET4, T3FREE, THYROIDAB in the  last 72 hours. Anemia Panel: No results for input(s): VITAMINB12, FOLATE, FERRITIN, TIBC, IRON, RETICCTPCT in the last 72 hours. Sepsis Labs: No results for input(s): PROCALCITON, LATICACIDVEN in the last 168 hours.  No results found for this or any previous visit (from the past 240 hour(s)).       Radiology Studies: No results found.      Scheduled Meds: . vitamin C  500 mg Oral Daily  . aspirin EC  81 mg Oral Daily  . carvedilol  3.125 mg Oral BID  . chlorpheniramine-HYDROcodone  5 mL Oral Q12H  . enoxaparin (LOVENOX) injection  40 mg Subcutaneous Q24H  . insulin aspart  0-9 Units Subcutaneous TID WC  . Ipratropium-Albuterol  1 puff Inhalation TID  . mouth rinse  15 mL Mouth Rinse BID  . methylPREDNISolone (SOLU-MEDROL) injection  40 mg Intravenous Q24H  . pantoprazole  40 mg Oral Daily  . polyethylene  glycol  17 g Oral Daily  . pravastatin  40 mg Oral Daily  . saline  1 application Each Nare Q12H  . sodium chloride flush  3 mL Intravenous Q12H  . zinc sulfate  220 mg Oral Daily   Continuous Infusions:   LOS: 14 days    Time spent: 38 minutes spent on chart review, discussion with nursing staff, consultants, updating family and interview/physical exam; more than 50% of that time was spent in counseling and/or coordination of care.    Alvira Philips [place], DO [facility] Available via Epic secure chat 7am-7pm After these hours, please refer to coverage provider listed on [email or web address] 12/08/2020, 10:32 AM

## 2020-12-08 NOTE — Progress Notes (Signed)
Patient on 9 liters HFNC maintaining in the low to mid 90s at rest. Assisted patient to bathroom for BM, patient on 10 liters, became very dyspneic, oxygen saturation in the 50's. Patient placed on 15 liters HFNC and NRB, sats came up to low 90s on this combination.  Patient remained on this until he got back to bed and after approximately 5 minutes was able to remove the NRB and go back to just HFNC.

## 2020-12-09 LAB — GLUCOSE, CAPILLARY
Glucose-Capillary: 129 mg/dL — ABNORMAL HIGH (ref 70–99)
Glucose-Capillary: 143 mg/dL — ABNORMAL HIGH (ref 70–99)
Glucose-Capillary: 154 mg/dL — ABNORMAL HIGH (ref 70–99)
Glucose-Capillary: 150 mg/dL — ABNORMAL HIGH (ref 70–99)

## 2020-12-09 NOTE — Progress Notes (Signed)
PROGRESS NOTE    Curtis Rosales  ZOX:[id number] DOB: November 03, 1960 DOA: 11/24/2020 PCP: Ambrose Mantle, MD    Brief Narrative:  Curtis Rosales is a 60 year old male with past medical history significant for CAD, ischemic cardiomyopathy, essential hypertension, hyperlipidemia who presents to the ED with body aches, cough, fevers and progressive shortness of breath.  Patient is unvaccinated against Covid-19 with positive Covid-19 test on 11/18/2020.  Patient received steroids outpatient with no improvement of symptoms.  In the ED, temperature 21.3 F, RR 33, HR 102, BP 137/86, SPO2 93% on oxygen.  Sodium 133, potassium 4.1, chloride 101, CO2 20, glucose 107, BUN 22, creatinine 1.06.  CRP 4.4, lactic acid 1.7, procalcitonin less than 0.10.  WBC 6.5, hemoglobin 15.1, platelets 228.  D-dimer 0.57.  Hospital service was consulted for further evaluation and management of acute hypoxic respiratory failure secondary to Covid-19 viral pneumonia.   Assessment & Plan:   Principal Problem:   Pneumonia due to COVID-19 virus Active Problems:   Cardiomyopathy, ischemic   CAD (coronary artery disease)   Pure hypercholesterolemia   Acute respiratory disease due to COVID-19 virus   Acute respiratory failure with hypoxia (HCC)   Acute hypoxic respiratory failure secondary to acute Covid-19 viral pneumonia during the ongoing Covid 19 Pandemic - POA Patient presenting with progressive shortness of breath associated with body aches, fever, chills.  Initially tested positive for Covid-19 on 11/18/2020, attempted treatment outpatient with steroids unsuccessful with progression of disease process.  Patient was noted to be hypoxic on presentation requiring submental oxygen. --COVID test: 11/18/2020 --CRP 4.4>3.5>0.7>7.9>11.7>4.5>1.3>0.7>0.6>0.5, <0.5 --ddimer 0.57>>0.63>3.56>0.75>0.76>0.64>0.42>0.88>0.66 --Completed 5-day course of remdesivir on 11/28/2020 --Prednisone 40mg  PO daily, will continue to taper --prone for 2-3hrs  every 12hrs if able --Continue supplemental oxygen, titrate to maintain SPO2 greater than 92%, on 9L HFNC --Continue supportive care with Combivent MDI prn, vitamin C, zinc, Tylenol, antitussives  --Continue airborne/contact isolation precautions   The treatment plan and use of medications and known side effects were discussed with patient/family. Some of the medications used are based on case reports/anecdotal data.  All other medications being used in the management of COVID-19 based on limited study data.  Complete risks and long-term side effects are unknown, however in the best clinical judgment they seem to be of some benefit.  Patient wanted to proceed with treatment options provided.  Essential hypertension Ischemic cardiomyopathy --Carvedilol 3.125 mg p.o. twice daily --Aspirin 81 mg p.o. daily and statin  HLD: Pravastatin 40 mg p.o. daily  GERD: Protonix 40 mg p.o. daily   DVT prophylaxis: Lovenox   Code Status: Full Code Family Communication: Updated patient spouse, via telephone yesterday.  Disposition Plan:  Level of care: Progressive Status is: Inpatient  Remains inpatient appropriate because:Ongoing diagnostic testing needed not appropriate for outpatient work up, Unsafe d/c plan, IV treatments appropriate due to intensity of illness or inability to take PO and Inpatient level of care appropriate due to severity of illness   Dispo: The patient is from: Home              Anticipated d/c is to: Home              Anticipated d/c date is: > 3 days              Patient currently is not medically stable to d/c.   Difficult to place patient Yes    Consultants:   none  Procedures:   none  Antimicrobials:   none   Subjective: Patient seen  and examined at bedside, resting comfortably.  No specific concerns this morning.  Continues on 9 L high flow nasal cannula..  Denies headache, no visual changes, no chest pain, no current fever/chills/night sweats, no  nausea/vomiting/diarrhea, no abdominal pain.  No acute events overnight per nursing staff.  Objective: Vitals:   12/08/20 1029 12/08/20 1728 12/08/20 2106 12/09/20 0425  BP: 107/68  114/81 109/78  Pulse: 80  89 82  Resp: 15  18 18   Temp: 98.2 F (36.8 C)  98.4 F (36.9 C) 98.4 F (36.9 C)  TempSrc: Oral  Oral Oral  SpO2: 90% 93% 94% 94%  Weight:      Height:        Intake/Output Summary (Last 24 hours) at 12/09/2020 1122 Last data filed at 12/09/2020 0900 Gross per 24 hour  Intake 1080 ml  Output --  Net 1080 ml   Filed Weights   11/26/20 1120  Weight: 94.3 kg    Examination:  General exam: Appears calm and comfortable, chronically ill appearance Respiratory system: Clear to auscultation. Respiratory effort normal.  No accessory muscle use.  On 9 L high flow nasal cannula with SPO2 94% Cardiovascular system: S1 & S2 heard, RRR. No JVD, murmurs, rubs, gallops or clicks. No pedal edema. Gastrointestinal system: Abdomen is nondistended, soft and nontender. No organomegaly or masses felt. Normal bowel sounds heard. Central nervous system: Alert and oriented. No focal neurological deficits. Extremities: Symmetric 5 x 5 power. Skin: No rashes, lesions or ulcers Psychiatry: Judgement and insight appear normal. Mood & affect appropriate.     Data Reviewed: I have personally reviewed following labs and imaging studies  CBC: Recent Labs  Lab 12/06/20 0415 12/07/20 0531  WBC 11.8* 12.3*  HGB 14.1 14.8  HCT 43.0 44.3  MCV 82.7 83.9  PLT 493* 520*   Basic Metabolic Panel: Recent Labs  Lab 12/03/20 0438 12/04/20 0458 12/05/20 0421 12/06/20 0415 12/07/20 0531  NA 138 136 136 137 133*  K 5.0 4.6 5.3* 5.3* 4.9  CL 101 101 102 100 99  CO2 27 24 25 27 28   GLUCOSE 193* 183* 192* 162* 112*  BUN 42* 37* 33* 31* 29*  CREATININE 1.01 0.79 0.88 0.84 0.78  CALCIUM 8.8* 8.6* 8.6* 8.8* 8.5*   GFR: Estimated Creatinine Clearance: 110.8 mL/min (by C-G formula based on SCr of  0.78 mg/dL). Liver Function Tests: Recent Labs  Lab 12/03/20 0438 12/04/20 0458 12/05/20 0421 12/06/20 0415 12/07/20 0531  AST 17 18 19 21 19   ALT 37 37 40 46* 44  ALKPHOS 51 48 45 48 53  BILITOT 0.7 0.8 0.8 0.8 1.0  PROT 6.6 6.4* 6.1* 6.0* 6.1*  ALBUMIN 2.9* 2.7* 2.7* 2.7* 2.8*   No results for input(s): LIPASE, AMYLASE in the last 168 hours. No results for input(s): AMMONIA in the last 168 hours. Coagulation Profile: No results for input(s): INR, PROTIME in the last 168 hours. Cardiac Enzymes: No results for input(s): CKTOTAL, CKMB, CKMBINDEX, TROPONINI in the last 168 hours. BNP (last 3 results) No results for input(s): PROBNP in the last 8760 hours. HbA1C: No results for input(s): HGBA1C in the last 72 hours. CBG: Recent Labs  Lab 12/07/20 1936 12/08/20 0822 12/08/20 1131 12/08/20 2100 12/09/20 0802  GLUCAP 136* 122* 165* 161* 129*   Lipid Profile: No results for input(s): CHOL, HDL, LDLCALC, TRIG, CHOLHDL, LDLDIRECT in the last 72 hours. Thyroid Function Tests: No results for input(s): TSH, T4TOTAL, FREET4, T3FREE, THYROIDAB in the last 72 hours. Anemia Panel: No  results for input(s): VITAMINB12, FOLATE, FERRITIN, TIBC, IRON, RETICCTPCT in the last 72 hours. Sepsis Labs: No results for input(s): PROCALCITON, LATICACIDVEN in the last 168 hours.  No results found for this or any previous visit (from the past 240 hour(s)).       Radiology Studies: No results found.      Scheduled Meds: . vitamin C  500 mg Oral Daily  . aspirin EC  81 mg Oral Daily  . carvedilol  3.125 mg Oral BID  . chlorpheniramine-HYDROcodone  5 mL Oral Q12H  . enoxaparin (LOVENOX) injection  40 mg Subcutaneous Q24H  . insulin aspart  0-9 Units Subcutaneous TID WC  . Ipratropium-Albuterol  1 puff Inhalation TID  . mouth rinse  15 mL Mouth Rinse BID  . pantoprazole  40 mg Oral Daily  . polyethylene glycol  17 g Oral Daily  . pravastatin  40 mg Oral Daily  . predniSONE  40 mg  Oral Q breakfast  . saline  1 application Each Nare Q12H  . sodium chloride flush  3 mL Intravenous Q12H  . zinc sulfate  220 mg Oral Daily   Continuous Infusions:   LOS: 15 days    Time spent: 36 minutes spent on chart review, discussion with nursing staff, consultants, updating family and interview/physical exam; more than 50% of that time was spent in counseling and/or coordination of care.    Alvira Philips [place], DO [facility] Available via Epic secure chat 7am-7pm After these hours, please refer to coverage provider listed on [email or web address] 12/09/2020, 11:22 AM

## 2020-12-09 NOTE — Progress Notes (Signed)
Physical Therapy Treatment Patient Details Name: Curtis Rosales MRN: [id number] DOB: [birth year] Today's Date: 12/09/2020    History of Present Illness Pt is 60 year old male unvaccinated for COVID-19 with past medical history for coronary artery disease, ischemic cardiomyopathy who tested positive for COVID-19 11/18/2020. Now admitted with diagnosis of COVID pneumonia.    PT Comments    Pt reports fatigue and generalized weakness today.  Pt only able to tolerate transfer to recliner today and SPO2 dropped to 64% on 10L HFNC.  RN notified and aware.   Follow Up Recommendations  Outpatient PT (no ins for [facility] per notes)     Equipment Recommendations  None recommended by PT    Recommendations for Other Services       Precautions / Restrictions Precautions Precautions: Other (comment) Precaution Comments: Monitor Sats    Mobility  Bed Mobility Overal bed mobility: Modified Independent             General bed mobility comments: Spo2 92% on 9L  Transfers Overall transfer level: Needs assistance Equipment used: None Transfers: Sit to/from [employer] Transfers Sit to Stand: Min guard Stand pivot transfers: Min guard       General transfer comment: pt reports increased weakness and dyspnea today with sitting EOB, SPO2 dropped to 64% with transfer to recliner on 10L HFNC, required at least 4 minutes recovering on 13L and only 80% so RN okay with 15L NRB, SPO2 improved to 90% on both HFNC and NRB; pt left on 15L HFNC upon leaving room and RN aware  Ambulation/Gait             General Gait Details: pt felt unable to tolerate today, desaturating with transfer to recliner   Stairs             Wheelchair Mobility    Modified Rankin (Stroke Patients Only)       Balance                                            Cognition Arousal/Alertness: Awake/alert Behavior During Therapy: WFL for tasks assessed/performed Overall Cognitive Status:  Within Functional Limits for tasks assessed                                        Exercises      General Comments        Pertinent Vitals/Pain Pain Assessment: No/denies pain    Home Living                      Prior Function            PT Goals (current goals can now be found in the care plan section) Acute Rehab PT Goals PT Goal Formulation: With patient Time For Goal Achievement: 12/23/20 Potential to Achieve Goals: Good Progress towards PT goals: Progressing toward goals    Frequency    Min 3X/week      PT Plan Current plan remains appropriate    Co-evaluation              AM-PAC PT "6 Clicks" Mobility   Outcome Measure  Help needed turning from your back to your side while in a flat bed without using bedrails?: None Help needed moving from lying on your back to sitting  on the side of a flat bed without using bedrails?: None Help needed moving to and from a bed to a chair (including a wheelchair)?: A Little Help needed standing up from a chair using your arms (e.g., wheelchair or bedside chair)?: A Little Help needed to walk in hospital room?: A Little Help needed climbing 3-5 steps with a railing? : A Little 6 Click Score: 20    End of Session Equipment Utilized During Treatment: Oxygen Activity Tolerance: Other (comment) (limited by desaturation and dyspnea) Patient left: with call bell/phone within reach;in chair Nurse Communication: Mobility status PT Visit Diagnosis: Difficulty in walking, not elsewhere classified (R26.2)     Time: 7782-4235 PT Time Calculation (min) (ACUTE ONLY): 23 min  Charges:  $Therapeutic Activity: 8-22 mins                     Thomasene Mohair PT, DPT Acute Rehabilitation Services Pager: [phone] Office: [phone]  Breanda Greenlaw,KATHrine E 12/09/2020, 1:09 PM

## 2020-12-09 NOTE — Plan of Care (Signed)
?  Problem: Health Behavior/Discharge Planning: ?Goal: Ability to manage health-related needs will improve ?Outcome: Progressing ?  ?Problem: Clinical Measurements: ?Goal: Ability to maintain clinical measurements within normal limits will improve ?Outcome: Progressing ?  ?Problem: Coping: ?Goal: Level of anxiety will decrease ?Outcome: Progressing ?  ?Problem: Elimination: ?Goal: Will not experience complications related to bowel motility ?Outcome: Progressing ?  ?Problem: Pain Managment: ?Goal: General experience of comfort will improve ?Outcome: Progressing ?  ?Problem: Safety: ?Goal: Ability to remain free from injury will improve ?Outcome: Progressing ?  ?

## 2020-12-09 NOTE — Plan of Care (Signed)
  Problem: Health Behavior/Discharge Planning: Goal: Ability to manage health-related needs will improve Outcome: Progressing   Problem: Clinical Measurements: Goal: Ability to maintain clinical measurements within normal limits will improve Outcome: Progressing Goal: Will remain free from infection Outcome: Progressing Goal: Diagnostic test results will improve Outcome: Progressing Goal: Respiratory complications will improve Outcome: Progressing Goal: Cardiovascular complication will be avoided Outcome: Progressing   Problem: Activity: Goal: Risk for activity intolerance will decrease Outcome: Progressing   Problem: Nutrition: Goal: Adequate nutrition will be maintained Outcome: Progressing   Problem: Coping: Goal: Level of anxiety will decrease Outcome: Progressing   Problem: Elimination: Goal: Will not experience complications related to bowel motility Outcome: Progressing Goal: Will not experience complications related to urinary retention Outcome: Progressing   Problem: Pain Managment: Goal: General experience of comfort will improve Outcome: Progressing   Problem: Safety: Goal: Ability to remain free from injury will improve Outcome: Progressing   Problem: Skin Integrity: Goal: Risk for impaired skin integrity will decrease Outcome: Progressing   Problem: Education: Goal: Knowledge of risk factors and measures for prevention of condition will improve Outcome: Progressing   Problem: Coping: Goal: Psychosocial and spiritual needs will be supported Outcome: Progressing   Problem: Respiratory: Goal: Will maintain a patent airway Outcome: Progressing Goal: Complications related to the disease process, condition or treatment will be avoided or minimized Outcome: Progressing   

## 2020-12-10 LAB — GLUCOSE, CAPILLARY
Glucose-Capillary: 154 mg/dL — ABNORMAL HIGH (ref 70–99)
Glucose-Capillary: 106 mg/dL — ABNORMAL HIGH (ref 70–99)
Glucose-Capillary: 154 mg/dL — ABNORMAL HIGH (ref 70–99)
Glucose-Capillary: 183 mg/dL — ABNORMAL HIGH (ref 70–99)
Glucose-Capillary: 167 mg/dL — ABNORMAL HIGH (ref 70–99)

## 2020-12-10 MED ORDER — PREDNISONE 20 MG PO TABS
20.0000 mg | ORAL_TABLET | Freq: Every day | ORAL | Status: DC
  Administered 2020-12-11 – 2020-12-13 (×3): 20 mg via ORAL
  Filled 2020-12-10 (×3): qty 1

## 2020-12-10 NOTE — Plan of Care (Signed)
  Problem: Health Behavior/Discharge Planning: Goal: Ability to manage health-related needs will improve Outcome: Progressing   Problem: Clinical Measurements: Goal: Ability to maintain clinical measurements within normal limits will improve Outcome: Progressing Goal: Will remain free from infection Outcome: Progressing Goal: Diagnostic test results will improve Outcome: Progressing Goal: Respiratory complications will improve Outcome: Progressing Goal: Cardiovascular complication will be avoided Outcome: Progressing   Problem: Activity: Goal: Risk for activity intolerance will decrease Outcome: Progressing   Problem: Nutrition: Goal: Adequate nutrition will be maintained Outcome: Progressing   Problem: Coping: Goal: Level of anxiety will decrease Outcome: Progressing   Problem: Elimination: Goal: Will not experience complications related to bowel motility Outcome: Progressing Goal: Will not experience complications related to urinary retention Outcome: Progressing   Problem: Pain Managment: Goal: General experience of comfort will improve Outcome: Progressing   Problem: Safety: Goal: Ability to remain free from injury will improve Outcome: Progressing   Problem: Skin Integrity: Goal: Risk for impaired skin integrity will decrease Outcome: Progressing   Problem: Education: Goal: Knowledge of risk factors and measures for prevention of condition will improve Outcome: Progressing   Problem: Coping: Goal: Psychosocial and spiritual needs will be supported Outcome: Progressing   Problem: Respiratory: Goal: Will maintain a patent airway Outcome: Progressing Goal: Complications related to the disease process, condition or treatment will be avoided or minimized Outcome: Progressing   

## 2020-12-10 NOTE — Progress Notes (Signed)
PROGRESS NOTE    Curtis Rosales  TKZ:[id number] DOB: 1961/05/07 DOA: 11/24/2020 PCP: Ambrose Mantle, MD    Brief Narrative:  Curtis Rosales is a 60 year old male with past medical history significant for CAD, ischemic cardiomyopathy, essential hypertension, hyperlipidemia who presents to the ED with body aches, cough, fevers and progressive shortness of breath.  Patient is unvaccinated against Covid-19 with positive Covid-19 test on 11/18/2020.  Patient received steroids outpatient with no improvement of symptoms.  In the ED, temperature 21.3 F, RR 33, HR 102, BP 137/86, SPO2 93% on oxygen.  Sodium 133, potassium 4.1, chloride 101, CO2 20, glucose 107, BUN 22, creatinine 1.06.  CRP 4.4, lactic acid 1.7, procalcitonin less than 0.10.  WBC 6.5, hemoglobin 15.1, platelets 228.  D-dimer 0.57.  Hospital service was consulted for further evaluation and management of acute hypoxic respiratory failure secondary to Covid-19 viral pneumonia.   Assessment & Plan:   Principal Problem:   Pneumonia due to COVID-19 virus Active Problems:   Cardiomyopathy, ischemic   CAD (coronary artery disease)   Pure hypercholesterolemia   Acute respiratory disease due to COVID-19 virus   Acute respiratory failure with hypoxia (HCC)   Acute hypoxic respiratory failure secondary to acute Covid-19 viral pneumonia during the ongoing Covid 19 Pandemic - POA Patient presenting with progressive shortness of breath associated with body aches, fever, chills.  Initially tested positive for Covid-19 on 11/18/2020, attempted treatment outpatient with steroids unsuccessful with progression of disease process.  Patient was noted to be hypoxic on presentation requiring submental oxygen. --COVID test: 11/18/2020 --CRP 4.4>3.5>0.7>7.9>11.7>4.5>1.3>0.7>0.6>0.5, <0.5 --ddimer 0.57>>0.63>3.56>0.75>0.76>0.64>0.42>0.88>0.66 --Completed 5-day course of remdesivir on 11/28/2020 --Prednisone 20mg  PO daily, will continue to taper --prone for 2-3hrs  every 12hrs if able --Continue supplemental oxygen, titrate to maintain SPO2 greater than 93%, on 10L HFNC --Continue supportive care with Combivent MDI prn, vitamin C, zinc, Tylenol, antitussives  --Continue airborne/contact isolation precautions   The treatment plan and use of medications and known side effects were discussed with patient/family. Some of the medications used are based on case reports/anecdotal data.  All other medications being used in the management of COVID-19 based on limited study data.  Complete risks and long-term side effects are unknown, however in the best clinical judgment they seem to be of some benefit.  Patient wanted to proceed with treatment options provided.  Essential hypertension Ischemic cardiomyopathy --Carvedilol 3.125 mg p.o. twice daily --Aspirin 81 mg p.o. daily and statin  HLD: Pravastatin 40 mg p.o. daily  GERD: Protonix 40 mg p.o. daily   DVT prophylaxis: Lovenox   Code Status: Full Code Family Communication: Updated patient spouse, via telephone yesterday.  Disposition Plan:  Level of care: Progressive Status is: Inpatient  Remains inpatient appropriate because:Ongoing diagnostic testing needed not appropriate for outpatient work up, Unsafe d/c plan, IV treatments appropriate due to intensity of illness or inability to take PO and Inpatient level of care appropriate due to severity of illness   Dispo: The patient is from: Home              Anticipated d/c is to: Home              Anticipated d/c date is: > 3 days              Patient currently is not medically stable to d/c.   Difficult to place patient Yes    Consultants:   none  Procedures:   none  Antimicrobials:   none   Subjective: Patient seen  and examined at bedside, resting comfortably.  No specific concerns this morning.  Continues on 10 L high flow nasal cannula.  Denies headache, no visual changes, no chest pain, no current fever/chills/night sweats, no  nausea/vomiting/diarrhea, no abdominal pain.  No acute events overnight per nursing staff.  Objective: Vitals:   12/09/20 0425 12/09/20 1203 12/09/20 2134 12/10/20 0503  BP: 109/78 98/61 113/81 119/77  Pulse: 82 78 79 68  Resp: 18 20 18 18   Temp: 98.4 F (36.9 C) (!) 97.5 F (36.4 C) 98.1 F (36.7 C) 98 F (36.7 C)  TempSrc: Oral Oral Oral Oral  SpO2: 94% 90% 96% 93%  Weight:      Height:        Intake/Output Summary (Last 24 hours) at 12/10/2020 1115 Last data filed at 12/10/2020 0458 Gross per 24 hour  Intake 600 ml  Output 1500 ml  Net -900 ml   Filed Weights   11/26/20 1120  Weight: 94.3 kg    Examination:  General exam: Appears calm and comfortable, chronically ill appearance Respiratory system: Clear to auscultation. Respiratory effort normal.  No accessory muscle use.  On 10 L high flow nasal cannula with SPO2 93% Cardiovascular system: S1 & S2 heard, RRR. No JVD, murmurs, rubs, gallops or clicks. No pedal edema. Gastrointestinal system: Abdomen is nondistended, soft and nontender. No organomegaly or masses felt. Normal bowel sounds heard. Central nervous system: Alert and oriented. No focal neurological deficits. Extremities: Symmetric 5 x 5 power. Skin: No rashes, lesions or ulcers Psychiatry: Judgement and insight appear normal. Mood & affect appropriate.     Data Reviewed: I have personally reviewed following labs and imaging studies  CBC: Recent Labs  Lab 12/06/20 0415 12/07/20 0531  WBC 11.8* 12.3*  HGB 14.1 14.8  HCT 43.0 44.3  MCV 82.7 83.9  PLT 493* 520*   Basic Metabolic Panel: Recent Labs  Lab 12/04/20 0458 12/05/20 0421 12/06/20 0415 12/07/20 0531  NA 136 136 137 133*  K 4.6 5.3* 5.3* 4.9  CL 101 102 100 99  CO2 24 25 27 28   GLUCOSE 183* 192* 162* 112*  BUN 37* 33* 31* 29*  CREATININE 0.79 0.88 0.84 0.78  CALCIUM 8.6* 8.6* 8.8* 8.5*   GFR: Estimated Creatinine Clearance: 110.8 mL/min (by C-G formula based on SCr of 0.78  mg/dL). Liver Function Tests: Recent Labs  Lab 12/04/20 0458 12/05/20 0421 12/06/20 0415 12/07/20 0531  AST 18 19 21 19   ALT 37 40 46* 44  ALKPHOS 48 45 48 53  BILITOT 0.8 0.8 0.8 1.0  PROT 6.4* 6.1* 6.0* 6.1*  ALBUMIN 2.7* 2.7* 2.7* 2.8*   No results for input(s): LIPASE, AMYLASE in the last 168 hours. No results for input(s): AMMONIA in the last 168 hours. Coagulation Profile: No results for input(s): INR, PROTIME in the last 168 hours. Cardiac Enzymes: No results for input(s): CKTOTAL, CKMB, CKMBINDEX, TROPONINI in the last 168 hours. BNP (last 3 results) No results for input(s): PROBNP in the last 8760 hours. HbA1C: No results for input(s): HGBA1C in the last 72 hours. CBG: Recent Labs  Lab 12/09/20 0802 12/09/20 1200 12/09/20 1715 12/09/20 2119 12/10/20 0808  GLUCAP 129* 143* 154* 150* 106*   Lipid Profile: No results for input(s): CHOL, HDL, LDLCALC, TRIG, CHOLHDL, LDLDIRECT in the last 72 hours. Thyroid Function Tests: No results for input(s): TSH, T4TOTAL, FREET4, T3FREE, THYROIDAB in the last 72 hours. Anemia Panel: No results for input(s): VITAMINB12, FOLATE, FERRITIN, TIBC, IRON, RETICCTPCT in the last 72  hours. Sepsis Labs: No results for input(s): PROCALCITON, LATICACIDVEN in the last 168 hours.  No results found for this or any previous visit (from the past 240 hour(s)).       Radiology Studies: No results found.      Scheduled Meds: . vitamin C  500 mg Oral Daily  . aspirin EC  81 mg Oral Daily  . carvedilol  3.125 mg Oral BID  . chlorpheniramine-HYDROcodone  5 mL Oral Q12H  . enoxaparin (LOVENOX) injection  40 mg Subcutaneous Q24H  . insulin aspart  0-9 Units Subcutaneous TID WC  . Ipratropium-Albuterol  1 puff Inhalation TID  . mouth rinse  15 mL Mouth Rinse BID  . pantoprazole  40 mg Oral Daily  . polyethylene glycol  17 g Oral Daily  . pravastatin  40 mg Oral Daily  . predniSONE  40 mg Oral Q breakfast  . saline  1 application  Each Nare Q12H  . sodium chloride flush  3 mL Intravenous Q12H  . zinc sulfate  220 mg Oral Daily   Continuous Infusions:   LOS: 16 days    Time spent: 36 minutes spent on chart review, discussion with nursing staff, consultants, updating family and interview/physical exam; more than 50% of that time was spent in counseling and/or coordination of care.    Alvira Philips [place], DO [facility] Available via Epic secure chat 7am-7pm After these hours, please refer to coverage provider listed on [email or web address] 12/10/2020, 11:15 AM

## 2020-12-10 NOTE — Progress Notes (Signed)
Occupational Therapy Treatment Patient Details Name: Curtis Rosales MRN: [id number] DOB: 12/02/1960 Today's Date: 12/10/2020    History of present illness Pt is 60 year old male unvaccinated for COVID-19 with past medical history for coronary artery disease, ischemic cardiomyopathy who tested positive for COVID-19 11/18/2020. Now admitted with diagnosis of COVID pneumonia.   OT comments  Patient continues to have significant desaturation in O2 with minimal ADL activity. Attempted standing at sink side for oral care pt initially maintaining 85% on 10L, after standing ~30 seconds needing to sit with desat to 69%. Increased pt up to 15, completed x10 reps on IS and prolonged rest break/PLB however patient unable to maintain sats. Pt fluctuating between 77-84%. RN aware and in room at end of session. Continue with POC.   Follow Up Recommendations  LTACH    Equipment Recommendations  None recommended by OT       Precautions / Restrictions Precautions Precautions: Other (comment) Precaution Comments: Monitor Sats       Mobility Bed Mobility Overal bed mobility: Modified Independent             General bed mobility comments: pt reading 91% on 10L post bed mobility  Transfers Overall transfer level: Needs assistance Equipment used: None Transfers: Sit to/from Stand;Stand Pivot Transfers Sit to Stand: Supervision Stand pivot transfers: Supervision                ADL either performed or assessed with clinical judgement   ADL Overall ADL's : Needs assistance/impaired     Grooming: Oral care;Standing;Supervision/safety Grooming Details (indicate cue type and reason): stood at sink approx 30 seconds to perform oral care on 10 L HFNC initially reading 85%, however pt states "I need to sit" once in chair desat as low as 69%, increased O2 up to 15L with RN aware                 Toilet Transfer: Supervision/safety;Stand-pivot Statistician Details (indicate cue type and  reason): to recliner                           Cognition Arousal/Alertness: Awake/alert Behavior During Therapy: WFL for tasks assessed/performed Overall Cognitive Status: Within Functional Limits for tasks assessed                                          Exercises Exercises: Other exercises Other Exercises Other Exercises: IS x10 seated in chair      General Comments patient having max difficulty maintaining O2 levels with prolonged rest seated in chair on 15L. Patient fluctuating between 77-84% with RN present in room at end of session.    Pertinent Vitals/ Pain       Pain Assessment: No/denies pain         Frequency  Min 2X/week        Progress Toward Goals  OT Goals(current goals can now be found in the care plan section)  Progress towards OT goals: Not progressing toward goals - comment (continued dyspnea and significant drop in O2 with minimal exertion)  Acute Rehab OT Goals Patient Stated Goal: To get off oxygen OT Goal Formulation: With patient Time For Goal Achievement: 12/12/20 Potential to Achieve Goals: Good  Plan Discharge plan needs to be updated       AM-PAC OT "6 Clicks" Daily Activity     Outcome Measure  Help from another person eating meals?: None Help from another person taking care of personal grooming?: A Little Help from another person toileting, which includes using toliet, bedpan, or urinal?: A Little Help from another person bathing (including washing, rinsing, drying)?: A Little Help from another person to put on and taking off regular upper body clothing?: A Little Help from another person to put on and taking off regular lower body clothing?: A Little 6 Click Score: 19    End of Session Equipment Utilized During Treatment: Oxygen  OT Visit Diagnosis: Other (comment) (COVID PNA)   Activity Tolerance Patient limited by fatigue   Patient Left in chair;with call bell/phone within reach;with  nursing/sitter in room   Nurse Communication Other (comment) (O2 sats)        Time: 7034-0352 OT Time Calculation (min): 25 min  Charges: OT General Charges $OT Visit: 1 Visit OT Treatments $Self Care/Home Management : 23-37 mins  Marlyce Huge OT OT pager: [phone]   Carmelia Roller 12/10/2020, 12:17 PM

## 2020-12-11 ENCOUNTER — Inpatient Hospital Stay (HOSPITAL_COMMUNITY): Payer: 59

## 2020-12-11 LAB — COMPREHENSIVE METABOLIC PANEL
ALT: 36 U/L (ref 0–44)
AST: 15 U/L (ref 15–41)
Albumin: 2.7 g/dL — ABNORMAL LOW (ref 3.5–5.0)
Alkaline Phosphatase: 51 U/L (ref 38–126)
Anion gap: 9 (ref 5–15)
BUN: 21 mg/dL — ABNORMAL HIGH (ref 6–20)
CO2: 25 mmol/L (ref 22–32)
Calcium: 8.5 mg/dL — ABNORMAL LOW (ref 8.9–10.3)
Chloride: 103 mmol/L (ref 98–111)
Creatinine, Ser: 0.5 mg/dL — ABNORMAL LOW (ref 0.61–1.24)
GFR, Estimated: >60 mL/min (ref >60)
Glucose, Bld: 114 mg/dL — ABNORMAL HIGH (ref 70–99)
Potassium: 4.1 mmol/L (ref 3.5–5.1)
Sodium: 137 mmol/L (ref 135–145)
Total Bilirubin: 0.8 mg/dL (ref 0.3–1.2)
Total Protein: 5.4 g/dL — ABNORMAL LOW (ref 6.5–8.1)

## 2020-12-11 LAB — D-DIMER, QUANTITATIVE: D-Dimer, Quant: 0.39 ug/mL-FEU (ref 0.00–0.50)

## 2020-12-11 LAB — C-REACTIVE PROTEIN: CRP: <0.5 mg/dL (ref <1.0)

## 2020-12-11 LAB — GLUCOSE, CAPILLARY
Glucose-Capillary: 109 mg/dL — ABNORMAL HIGH (ref 70–99)
Glucose-Capillary: 155 mg/dL — ABNORMAL HIGH (ref 70–99)
Glucose-Capillary: 167 mg/dL — ABNORMAL HIGH (ref 70–99)
Glucose-Capillary: 157 mg/dL — ABNORMAL HIGH (ref 70–99)

## 2020-12-11 LAB — CBC
HCT: 42.4 % (ref 39.0–52.0)
Hemoglobin: 13.6 g/dL (ref 13.0–17.0)
MCH: 27.4 pg (ref 26.0–34.0)
MCHC: 32.1 g/dL (ref 30.0–36.0)
MCV: 85.5 fL (ref 80.0–100.0)
Platelets: 345 10*3/uL (ref 150–400)
RBC: 4.96 MIL/uL (ref 4.22–5.81)
RDW: 14.1 % (ref 11.5–15.5)
WBC: 11.6 10*3/uL — ABNORMAL HIGH (ref 4.0–10.5)
nRBC: 0 % (ref 0.0–0.2)

## 2020-12-11 LAB — BRAIN NATRIURETIC PEPTIDE: B Natriuretic Peptide: 27.5 pg/mL (ref 0.0–100.0)

## 2020-12-11 LAB — MAGNESIUM: Magnesium: 2.2 mg/dL (ref 1.7–2.4)

## 2020-12-11 MED ORDER — FUROSEMIDE 10 MG/ML IJ SOLN
20.0000 mg | Freq: Once | INTRAMUSCULAR | Status: AC
  Administered 2020-12-11: 20 mg via INTRAVENOUS
  Filled 2020-12-11: qty 2

## 2020-12-11 NOTE — Plan of Care (Signed)
  Problem: Health Behavior/Discharge Planning: Goal: Ability to manage health-related needs will improve Outcome: Progressing   Problem: Clinical Measurements: Goal: Ability to maintain clinical measurements within normal limits will improve Outcome: Progressing Goal: Will remain free from infection Outcome: Progressing Goal: Diagnostic test results will improve Outcome: Progressing Goal: Respiratory complications will improve Outcome: Progressing Goal: Cardiovascular complication will be avoided Outcome: Progressing   Problem: Activity: Goal: Risk for activity intolerance will decrease Outcome: Progressing   Problem: Nutrition: Goal: Adequate nutrition will be maintained Outcome: Progressing   Problem: Coping: Goal: Level of anxiety will decrease Outcome: Progressing   Problem: Elimination: Goal: Will not experience complications related to bowel motility Outcome: Progressing Goal: Will not experience complications related to urinary retention Outcome: Progressing   Problem: Pain Managment: Goal: General experience of comfort will improve Outcome: Progressing   Problem: Safety: Goal: Ability to remain free from injury will improve Outcome: Progressing   Problem: Skin Integrity: Goal: Risk for impaired skin integrity will decrease Outcome: Progressing   Problem: Education: Goal: Knowledge of risk factors and measures for prevention of condition will improve Outcome: Progressing   Problem: Coping: Goal: Psychosocial and spiritual needs will be supported Outcome: Progressing   Problem: Respiratory: Goal: Will maintain a patent airway Outcome: Progressing Goal: Complications related to the disease process, condition or treatment will be avoided or minimized Outcome: Progressing   

## 2020-12-11 NOTE — Progress Notes (Signed)
Physical Therapy Treatment Patient Details Name: Curtis Rosales MRN: [id number] DOB: Jan 17, 1961 Today's Date: 12/11/2020    History of Present Illness Pt is 60 year old male unvaccinated for COVID-19 with past medical history for coronary artery disease, ischemic cardiomyopathy who tested positive for COVID-19 11/18/2020. Now admitted with diagnosis of COVID pneumonia.    PT Comments    Pt tolerates transfer to bedside chair, able to take steps without AD and no LOB noted. Pt reports feeling weak in BLE and "tingly" in bil calves but no other complaints. Pt desats on 10L with mobility so increased to 14L for BLE strengthening. Pt able to perform seated LE strengthening and 3 STS reps, 2 sets with seated rest break between, desating to 85% and able to recover to 89-90% within ~2 minutes of rest. Continued education on pursed lip breathing with mobility. Pt appears encouraged with mobility today. Pt tolerates remaining up in chair, titrated back down to 10L with SpO2 93% at EOS- RN notified of SpO2 throughout session.  Follow Up Recommendations  Outpatient PT (no ins for [facility] per notes)     Equipment Recommendations  None recommended by PT    Recommendations for Other Services       Precautions / Restrictions Precautions Precautions: Other (comment) Precaution Comments: Monitor Sats Restrictions Weight Bearing Restrictions: No    Mobility  Bed Mobility Overal bed mobility: Modified Independent  General bed mobility comments: pt on 10L with SpO2 90% with bed mobility  Transfers Overall transfer level: Needs assistance Equipment used: None Transfers: Sit to/from Stand Sit to Stand: Supervision  General transfer comment: light use of bil hands to assist in powering up, reports "I feel shaky and my calves feel tingly" but able to complete without unsteadiness  Ambulation/Gait Ambulation/Gait assistance: Min guard Gait Distance (Feet): 3 Feet Assistive device: None Gait  Pattern/deviations: Step-through pattern;Decreased stride length  General Gait Details: pt ambulates from bed over to chair, on 10L O2 with desat to SpO2 80%, further ambulation not pursued   Optometrist    Modified Rankin (Stroke Patients Only)       Balance Overall balance assessment: No apparent balance deficits (not formally assessed)         Cognition Arousal/Alertness: Awake/alert Behavior During Therapy: WFL for tasks assessed/performed Overall Cognitive Status: Within Functional Limits for tasks assessed       Exercises General Exercises - Lower Extremity Long Arc Quad: Seated;AROM;Strengthening;Both;15 reps Hip Flexion/Marching: Seated;AROM;Strengthening;Both;15 reps Other Exercises Other Exercises: STS from chair, 3 reps, 2 sets with seated rest break between    General Comments General comments (skin integrity, edema, etc.): Pt on 10L O2 with SpO2 98% while supine at rest, desats to 80% with steps over to chair, unable to improve after ~3 minutes so increased to 14L with SpO2 90% within ~2 minutes. Increased to 14L O2 to perform strengthening exercises, desats to 85% but able to recover with pursed lip breathing cues back to 90%.      Pertinent Vitals/Pain Pain Assessment: No/denies pain    Home Living                      Prior Function            PT Goals (current goals can now be found in the care plan section) Acute Rehab PT Goals Patient Stated Goal: To get off oxygen PT Goal Formulation: With patient Time For Goal Achievement:  12/23/20 Potential to Achieve Goals: Good Progress towards PT goals: Progressing toward goals    Frequency    Min 3X/week      PT Plan Current plan remains appropriate    Co-evaluation              AM-PAC PT "6 Clicks" Mobility   Outcome Measure  Help needed turning from your back to your side while in a flat bed without using bedrails?: None Help needed moving  from lying on your back to sitting on the side of a flat bed without using bedrails?: None Help needed moving to and from a bed to a chair (including a wheelchair)?: A Little Help needed standing up from a chair using your arms (e.g., wheelchair or bedside chair)?: A Little Help needed to walk in hospital room?: A Little Help needed climbing 3-5 steps with a railing? : A Little 6 Click Score: 20    End of Session Equipment Utilized During Treatment: Oxygen Activity Tolerance: Patient tolerated treatment well Patient left: with call bell/phone within reach;in chair Nurse Communication: Mobility status;Other (comment) (SpO2) PT Visit Diagnosis: Difficulty in walking, not elsewhere classified (R26.2)     Time: 5277-8242 PT Time Calculation (min) (ACUTE ONLY): 22 min  Charges:  $Therapeutic Exercise: 8-22 mins                      Tori Kaeleb Emond PT, DPT 12/11/20, 3:24 PM

## 2020-12-11 NOTE — Progress Notes (Addendum)
PROGRESS NOTE    Curtis Rosales  JKD:[id number] DOB: 1961-10-06 DOA: 11/24/2020 PCP: Ambrose Mantle, MD   Chief Complaint  Patient presents with  . Covid Positive   Brief Narrative:  Curtis Rosales is Curtis Rosales 60 year old male with past medical history significant for CAD, ischemic cardiomyopathy, essential hypertension, hyperlipidemia who presents to the ED with body aches, cough, fevers and progressive shortness of breath.  Patient is unvaccinated against Covid-19 with positive Covid-19 test on 11/18/2020.  Patient received steroids outpatient with no improvement of symptoms.  He was admitted for acute hypoxic respiratory failure 2/2 COVID 19 pneumonia.   Assessment & Plan:   Principal Problem:   Pneumonia due to COVID-19 virus Active Problems:   Cardiomyopathy, ischemic   CAD (coronary artery disease)   Pure hypercholesterolemia   Acute respiratory disease due to COVID-19 virus   Acute respiratory failure with hypoxia (HCC)  Acute hypoxic respiratory failure secondary to acute Covid-19 viral pneumonia  Patient presenting with progressive shortness of breath associated with body aches, fever, chills.  Initially tested positive for Covid-19 on 11/18/2020, attempted treatment outpatient with steroids unsuccessful with progression of disease process.  Patient was noted to be hypoxic on presentation requiring submental oxygen. --COVID test: 11/18/2020 -- Currently requiring 10 L [place] -- CXR 2/9 with pneumomediastinum and progressive bilateral pneumonia (see report) --Completed 5-day course of remdesivir on 11/28/2020 --Steroids 1/23 - present.  Tapering.  --Did not receive actemra/baricitinib.  Could consider if worsening, but normal CRP and appears stable - and now day 16, low suspicion this would be beneficial at this time.  -- Strict I/O, daily weights - dependent edema, trial lasix  --Prone as able, OOB.  Therapy.    COVID-19 Labs  Recent Labs    12/11/20 0437  DDIMER 0.39  CRP <0.5    Lab  Results  Component Value Date   SARSCOV2NAA POSITIVE (Mishal Probert) 11/18/2020   Pneumomediastinum  CXR 2/9 with subcutaneous air in the R supraclavicular region and base of the R neck and new diffuse pneumomediastinum.  No definite pneumothorax.  Daily CXR.  Stop IS.  Avoid positive pressure ventilation if possible.  Essential hypertension Ischemic cardiomyopathy --Carvedilol 3.125 mg p.o. twice daily --Aspirin 81 mg p.o. daily and statin  HLD: Pravastatin 40 mg p.o. daily  GERD: Protonix 40 mg p.o. daily  DVT prophylaxis: lovenox Code Status: full  Family Communication: none at bedside - wife 2/9 over phone Disposition:   Status is: Inpatient  Remains inpatient appropriate because:Inpatient level of care appropriate due to severity of illness   Dispo: The patient is from: Home              Anticipated d/c is to: pending              Anticipated d/c date is: > 3 days              Patient currently is not medically stable to d/c.   Difficult to place patient No  Consultants:   none  Procedures:  LE [place] Summary:  BILATERAL:  - No evidence of deep vein thrombosis seen in the lower extremities,  bilaterally.  -No evidence of popliteal cyst, bilaterally.   Antimicrobials: Anti-infectives (From admission, onward)   Start     Dose/Rate Route Frequency Ordered Stop   11/25/20 1000  remdesivir 100 mg in sodium chloride 0.9 % 100 mL IVPB       "Followed by" Linked Group Details   100 mg 200 mL/hr over 30 Minutes Intravenous  Daily 11/24/20 2033 11/28/20 1100   11/24/20 2200  remdesivir 200 mg in sodium chloride 0.9% 250 mL IVPB       "Followed by" Linked Group Details   200 mg 580 mL/hr over 30 Minutes Intravenous Once 11/24/20 2033 11/24/20 2310     Subjective: Denies complaints today  Objective: Vitals:   12/10/20 1226 12/11/20 0601 12/11/20 0818 12/11/20 1155  BP: 98/78 128/84  104/69  Pulse: 90 67 89 78  Resp: 20 18 18 20   Temp: 97.9 F (36.6 C) 98.4 F (36.9 C)   98.5 F (36.9 C)  TempSrc: Oral Oral  Oral  SpO2: 99% 95% 95%   Weight:      Height:        Intake/Output Summary (Last 24 hours) at 12/11/2020 1444 Last data filed at 12/11/2020 1211 Gross per 24 hour  Intake --  Output 1550 ml  Net -1550 ml   Filed Weights   11/26/20 1120  Weight: 94.3 kg    Examination:  General exam: Appears calm and comfortable  Respiratory system: Clear to auscultation. Respiratory effort normal. Cardiovascular system: S1 & S2 heard, RRR. Gastrointestinal system: Abdomen is nondistended, soft and nontender Central nervous system: Alert and oriented. No focal neurological deficits. Extremities: no LEE, dependent edema present Skin: No rashes, lesions or ulcers Psychiatry: Judgement and insight appear normal. Mood & affect appropriate.     Data Reviewed: I have personally reviewed following labs and imaging studies  CBC: Recent Labs  Lab 12/06/20 0415 12/07/20 0531 12/11/20 0437  WBC 11.8* 12.3* 11.6*  HGB 14.1 14.8 13.6  HCT 43.0 44.3 42.4  MCV 82.7 83.9 85.5  PLT 493* 520* 345    Basic Metabolic Panel: Recent Labs  Lab 12/05/20 0421 12/06/20 0415 12/07/20 0531 12/11/20 0437  NA 136 137 133* 137  K 5.3* 5.3* 4.9 4.1  CL 102 100 99 103  CO2 25 27 28 25   GLUCOSE 192* 162* 112* 114*  BUN 33* 31* 29* 21*  CREATININE 0.88 0.84 0.78 0.50*  CALCIUM 8.6* 8.8* 8.5* 8.5*  MG  --   --   --  2.2    GFR: Estimated Creatinine Clearance: 110.8 mL/min (Barri Neidlinger) (by C-G formula based on SCr of 0.5 mg/dL (L)).  Liver Function Tests: Recent Labs  Lab 12/05/20 0421 12/06/20 0415 12/07/20 0531 12/11/20 0437  AST 19 21 19 15   ALT 40 46* 44 36  ALKPHOS 45 48 53 51  BILITOT 0.8 0.8 1.0 0.8  PROT 6.1* 6.0* 6.1* 5.4*  ALBUMIN 2.7* 2.7* 2.8* 2.7*    CBG: Recent Labs  Lab 12/10/20 1207 12/10/20 1648 12/10/20 2232 12/11/20 0804 12/11/20 1114  GLUCAP 154* 183* 167* 109* 155*     No results found for this or any previous visit (from the  past 240 hour(s)).       Radiology Studies: DG CHEST PORT 1 VIEW  Result Date: 12/11/2020 CLINICAL DATA:  Respiratory failure secondary to bilateral COVID-19 pneumonia. EXAM: PORTABLE CHEST 1 VIEW COMPARISON:  CTA of the chest on 12/03/2020, chest x-rays on 12/01/2020, 11/28/2020 and 11/24/2020 FINDINGS: The heart size and mediastinal contours are within normal limits. There is new subcutaneous air in the soft tissues of the right supraclavicular region and base of the right neck as well as new diffuse pneumomediastinum. No definite pneumothorax visualized. No visible pleural effusions. Progressive bilateral pneumonia since prior chest x-rays in [place] similar distribution to the chest CTA. Involvement of the left lung is slightly more dense than the  right by x-ray. The visualized skeletal structures are unremarkable. IMPRESSION: 1. New subcutaneous air in the right supraclavicular region and base of the right neck and new diffuse pneumomediastinum. No definite pneumothorax is visualized. 2. Progressive bilateral pneumonia since prior chest x-rays in [place] similar distribution to the recent chest CTA. 3. These results will be called to the ordering clinician or representative by the Radiologist Assistant, and communication documented in the PACS or [employer]. Electronically Signed   By: Irish Lack M.D.   On: 12/11/2020 10:02        Scheduled Meds: . vitamin C  500 mg Oral Daily  . aspirin EC  81 mg Oral Daily  . carvedilol  3.125 mg Oral BID  . chlorpheniramine-HYDROcodone  5 mL Oral Q12H  . enoxaparin (LOVENOX) injection  40 mg Subcutaneous Q24H  . furosemide  20 mg Intravenous Once  . insulin aspart  0-9 Units Subcutaneous TID WC  . Ipratropium-Albuterol  1 puff Inhalation TID  . mouth rinse  15 mL Mouth Rinse BID  . pantoprazole  40 mg Oral Daily  . polyethylene glycol  17 g Oral Daily  . pravastatin  40 mg Oral Daily  . predniSONE  20 mg Oral Q breakfast  . saline  1 application  Each Nare Q12H  . sodium chloride flush  3 mL Intravenous Q12H  . zinc sulfate  220 mg Oral Daily   Continuous Infusions:   LOS: 17 days    Time spent: over 30 min    Lacretia Nicks, MD [facility]   To contact the attending provider between 7A-7P or the covering provider during after hours 7P-7A, please log into the web site [email or web address] and access using universal [place] password for that web site. If you do not have the password, please call the hospital operator.  12/11/2020, 2:44 PM

## 2020-12-12 ENCOUNTER — Inpatient Hospital Stay (HOSPITAL_COMMUNITY): Payer: 59

## 2020-12-12 LAB — MAGNESIUM: Magnesium: 2.3 mg/dL (ref 1.7–2.4)

## 2020-12-12 LAB — CBC WITH DIFFERENTIAL/PLATELET
Abs Immature Granulocytes: 0.13 10*3/uL — ABNORMAL HIGH (ref 0.00–0.07)
Basophils Absolute: 0 10*3/uL (ref 0.0–0.1)
Basophils Relative: 0 %
Eosinophils Absolute: 0.5 10*3/uL (ref 0.0–0.5)
Eosinophils Relative: 5 %
HCT: 44.9 % (ref 39.0–52.0)
Hemoglobin: 14.3 g/dL (ref 13.0–17.0)
Immature Granulocytes: 1 %
Lymphocytes Relative: 23 %
Lymphs Abs: 2.5 10*3/uL (ref 0.7–4.0)
MCH: 27.1 pg (ref 26.0–34.0)
MCHC: 31.8 g/dL (ref 30.0–36.0)
MCV: 85 fL (ref 80.0–100.0)
Monocytes Absolute: 0.8 10*3/uL (ref 0.1–1.0)
Monocytes Relative: 8 %
Neutro Abs: 7.2 10*3/uL (ref 1.7–7.7)
Neutrophils Relative %: 63 %
Platelets: 321 10*3/uL (ref 150–400)
RBC: 5.28 MIL/uL (ref 4.22–5.81)
RDW: 14.5 % (ref 11.5–15.5)
WBC: 11.2 10*3/uL — ABNORMAL HIGH (ref 4.0–10.5)
nRBC: 0 % (ref 0.0–0.2)

## 2020-12-12 LAB — C-REACTIVE PROTEIN: CRP: 0.5 mg/dL (ref <1.0)

## 2020-12-12 LAB — GLUCOSE, CAPILLARY
Glucose-Capillary: 112 mg/dL — ABNORMAL HIGH (ref 70–99)
Glucose-Capillary: 168 mg/dL — ABNORMAL HIGH (ref 70–99)
Glucose-Capillary: 155 mg/dL — ABNORMAL HIGH (ref 70–99)
Glucose-Capillary: 115 mg/dL — ABNORMAL HIGH (ref 70–99)

## 2020-12-12 LAB — COMPREHENSIVE METABOLIC PANEL
ALT: 37 U/L (ref 0–44)
AST: 16 U/L (ref 15–41)
Albumin: 2.8 g/dL — ABNORMAL LOW (ref 3.5–5.0)
Alkaline Phosphatase: 52 U/L (ref 38–126)
Anion gap: 10 (ref 5–15)
BUN: 21 mg/dL — ABNORMAL HIGH (ref 6–20)
CO2: 26 mmol/L (ref 22–32)
Calcium: 8.6 mg/dL — ABNORMAL LOW (ref 8.9–10.3)
Chloride: 101 mmol/L (ref 98–111)
Creatinine, Ser: 0.75 mg/dL (ref 0.61–1.24)
GFR, Estimated: >60 mL/min (ref >60)
Glucose, Bld: 110 mg/dL — ABNORMAL HIGH (ref 70–99)
Potassium: 3.9 mmol/L (ref 3.5–5.1)
Sodium: 137 mmol/L (ref 135–145)
Total Bilirubin: 0.9 mg/dL (ref 0.3–1.2)
Total Protein: 5.7 g/dL — ABNORMAL LOW (ref 6.5–8.1)

## 2020-12-12 LAB — D-DIMER, QUANTITATIVE: D-Dimer, Quant: 0.41 ug/mL-FEU (ref 0.00–0.50)

## 2020-12-12 LAB — PHOSPHORUS: Phosphorus: 4.3 mg/dL (ref 2.5–4.6)

## 2020-12-12 MED ORDER — FUROSEMIDE 10 MG/ML IJ SOLN
20.0000 mg | Freq: Once | INTRAMUSCULAR | Status: AC
  Administered 2020-12-12: 20 mg via INTRAVENOUS
  Filled 2020-12-12: qty 2

## 2020-12-12 NOTE — Progress Notes (Addendum)
Occupational Therapy Treatment Patient Details Name: Curtis Rosales MRN: [id number] DOB: November 11, 1960 Today's Date: 12/12/2020    History of present illness Pt is 60 year old male unvaccinated for COVID-19 with past medical history for coronary artery disease, ischemic cardiomyopathy who tested positive for COVID-19 11/18/2020. Now admitted with diagnosis of COVID pneumonia. Found to have pneumomediastinum 2/9.   OT comments  Treatment focused on improving activity tolerance and advancing functional mobility in order to perform  ADLs out of bed and sitting. Patient able to ambulate in room today. See below for details. Patient's discharge will be determined by oxygenation needs. If continuing to need high levels of oxygen will need LTACH. If decreased to home levels will recommend [facility] OT     Follow Up Recommendations  LTACH;Home health OT    Equipment Recommendations  Tub/shower seat    Recommendations for Other Services      Precautions / Restrictions Precautions Precaution Comments: Monitor Sats, Do not use IS or FV due to pneumomediastinum       Mobility Bed Mobility Overal bed mobility: Modified Independent             General bed mobility comments: Mod I for bed mobility. 10 L HFNC sats maintained above 90%  Transfers Overall transfer level: Needs assistance Equipment used: Rolling walker (2 wheeled) Transfers: Sit to/from Stand Sit to Stand: Supervision Stand pivot transfers: Min guard       General transfer comment: Supervision to ambulate in room with RW - patient reports feeling more secure with walker. Ambulated forward from recliner toward door approx 8 feet than back on 10L and NRB - to test patient's tolerance. patient reports doing good and o2 sat above 90%. Ambulated around the bed and back to recliner - approx 24 feet. O2 sats maintained above 90% - though pleth signal beginning to detoriate due to loosing contact with ear lobe. Ambulated around bed again, another  24 feet, on 10 L NRB only - patient tolerated well. Sat dropped to 80% but recovered quickly.    Balance Overall balance assessment: No apparent balance deficits (not formally assessed)                                         ADL either performed or assessed with clinical judgement   ADL                                               Vision Patient Visual Report: No change from baseline     Perception     Praxis      Cognition Arousal/Alertness: Awake/alert Behavior During Therapy: WFL for tasks assessed/performed Overall Cognitive Status: Within Functional Limits for tasks assessed                                          Exercises     Shoulder Instructions       General Comments      Pertinent Vitals/ Pain       Pain Assessment: No/denies pain  Home Living  Prior Functioning/Environment              Frequency  Min 2X/week        Progress Toward Goals  OT Goals(current goals can now be found in the care plan section)  Progress towards OT goals: Progressing toward goals  Acute Rehab OT Goals Patient Stated Goal: To get off oxygen OT Goal Formulation: With patient Time For Goal Achievement: 12/26/20 Potential to Achieve Goals: Good ADL Goals Pt Will Transfer to Toilet: with supervision;ambulating;regular height toilet;grab bars Pt Will Perform Toileting - Clothing Manipulation and hygiene: with supervision;sit to/from stand Additional ADL Goal #1: Patient will stand at sink to perform full grooming task as evidence of improving activity tolerance Additional ADL Goal #2: Patient will perform 10 min functional activity or exercise activity as evidence of improving activity tolerance  Plan Discharge plan needs to be updated    Co-evaluation                 AM-PAC OT "6 Clicks" Daily Activity     Outcome Measure   Help from  another person eating meals?: None Help from another person taking care of personal grooming?: A Little Help from another person toileting, which includes using toliet, bedpan, or urinal?: A Little Help from another person bathing (including washing, rinsing, drying)?: A Little Help from another person to put on and taking off regular upper body clothing?: A Little Help from another person to put on and taking off regular lower body clothing?: A Little 6 Click Score: 19    End of Session Equipment Utilized During Treatment: Oxygen  OT Visit Diagnosis: Other (comment) (COVID pna)   Activity Tolerance Patient tolerated treatment well   Patient Left in chair;with call bell/phone within reach;with nursing/sitter in room   Nurse Communication Mobility status        Time: 2426-8341 OT Time Calculation (min): 23 min  Charges: OT General Charges $OT Visit: 1 Visit OT Treatments $Therapeutic Activity: 23-37 mins  Panzy Bubeck, OTR/L Acute Care Rehab Services  Office [phone] Pager: [phone]    Kelli Churn 12/12/2020, 4:10 PM

## 2020-12-12 NOTE — Progress Notes (Signed)
PROGRESS NOTE    Curtis Rosales  MKL:[id number] DOB: 18-Apr-1961 DOA: 11/24/2020 PCP: Ambrose Mantle, MD   Chief Complaint  Patient presents with  . Covid Positive   Brief Narrative:  Curtis Rosales is Curtis Rosales 60 year old male with past medical history significant for CAD, ischemic cardiomyopathy, essential hypertension, hyperlipidemia who presents to the ED with body aches, cough, fevers and progressive shortness of breath.  Patient is unvaccinated against Covid-19 with positive Covid-19 test on 11/18/2020.  Patient received steroids outpatient with no improvement of symptoms.  He was admitted for acute hypoxic respiratory failure 2/2 COVID 19 pneumonia.   Assessment & Plan:   Principal Problem:   Pneumonia due to COVID-19 virus Active Problems:   Cardiomyopathy, ischemic   CAD (coronary artery disease)   Pure hypercholesterolemia   Acute respiratory disease due to COVID-19 virus   Acute respiratory failure with hypoxia (HCC)  Acute hypoxic respiratory failure secondary to acute Covid-19 viral pneumonia  Patient presenting with progressive shortness of breath associated with body aches, fever, chills.  Initially tested positive for Covid-19 on 11/18/2020, attempted treatment outpatient with steroids unsuccessful with progression of disease process.  Patient was noted to be hypoxic on presentation requiring submental oxygen. --COVID test: 11/18/2020 -- Currently requiring 10 L [place] -- CXR 2/9 with pneumomediastinum and progressive bilateral pneumonia (see report) -- CXR 2/10 stable.  Follow 2/11 CXR. --Completed 5-day course of remdesivir on 11/28/2020 --Steroids 1/23 - present.  Tapering.  --Did not receive actemra/baricitinib.  Could consider if worsening, but normal CRP and appears stable - and now day 16, low suspicion this would be beneficial at this time.  -- Strict I/O, daily weights - dependent edema, trial lasix again today  --Prone as able, OOB.  Therapy.    COVID-19 Labs  Recent Labs     12/11/20 0437 12/12/20 0443  DDIMER 0.39 0.41  CRP <0.5 0.5    Lab Results  Component Value Date   SARSCOV2NAA POSITIVE ([place] U[place]) 11/18/2020   Pneumomediastinum  CXR 2/9 with subcutaneous air in the R supraclavicular region and base of the R neck and new diffuse pneumomediastinum.  No definite pneumothorax.  Stable 2/10. Daily CXR.  Stop IS.  Avoid positive pressure ventilation if possible.  Essential hypertension Ischemic cardiomyopathy --Carvedilol 3.125 mg p.o. twice daily --Aspirin 81 mg p.o. daily and statin  HLD: Pravastatin 40 mg p.o. daily  GERD: Protonix 40 mg p.o. daily  DVT prophylaxis: lovenox Code Status: full  Family Communication: none at bedside - wife 2/9 over phone Disposition:   Status is: Inpatient  Remains inpatient appropriate because:Inpatient level of care appropriate due to severity of illness   Dispo: The patient is from: Home              Anticipated d/c is to: pending              Anticipated d/c date is: > 3 days              Patient currently is not medically stable to d/c.   Difficult to place patient No  Consultants:   none  Procedures:  LE [place] Summary:  BILATERAL:  - No evidence of deep vein thrombosis seen in the lower extremities,  bilaterally.  -No evidence of popliteal cyst, bilaterally.   Antimicrobials: Anti-infectives (From admission, onward)   Start     Dose/Rate Route Frequency Ordered Stop   11/25/20 1000  remdesivir 100 mg in sodium chloride 0.9 % 100 mL IVPB       "  Followed by" Linked Group Details   100 mg 200 mL/hr over 30 Minutes Intravenous Daily 11/24/20 2033 11/28/20 1100   11/24/20 2200  remdesivir 200 mg in sodium chloride 0.9% 250 mL IVPB       "Followed by" Linked Group Details   200 mg 580 mL/hr over 30 Minutes Intravenous Once 11/24/20 2033 11/24/20 2310     Subjective: No new complaints  Objective: Vitals:   12/11/20 1155 12/11/20 2117 12/12/20 0559 12/12/20 0905  BP: 104/69 106/72 113/71  115/72  Pulse: 78 84 71 74  Resp: 20  20   Temp: 98.5 F (36.9 C) 97.6 F (36.4 C) 98 F (36.7 C)   TempSrc: Oral Oral Oral   SpO2:  95% 95%   Weight:      Height:        Intake/Output Summary (Last 24 hours) at 12/12/2020 0954 Last data filed at 12/12/2020 0911 Gross per 24 hour  Intake 240 ml  Output 2200 ml  Net -1960 ml   Filed Weights   11/26/20 1120  Weight: 94.3 kg    Examination:  General: No acute distress. Cardiovascular: Heart sounds show Brookley Spitler regular rate, and rhythm Lungs: Clear to auscultation bilaterally Abdomen: Soft, nontender, nondistended  Neurological: Alert and oriented 3. Moves all extremities 4. Cranial nerves II through XII grossly intact. Skin: Warm and dry. No rashes or lesions. Extremities: No clubbing or cyanosis. No edema  Data Reviewed: I have personally reviewed following labs and imaging studies  CBC: Recent Labs  Lab 12/06/20 0415 12/07/20 0531 12/11/20 0437 12/12/20 0443  WBC 11.8* 12.3* 11.6* 11.2*  NEUTROABS  --   --   --  7.2  HGB 14.1 14.8 13.6 14.3  HCT 43.0 44.3 42.4 44.9  MCV 82.7 83.9 85.5 85.0  PLT 493* 520* 345 321    Basic Metabolic Panel: Recent Labs  Lab 12/06/20 0415 12/07/20 0531 12/11/20 0437 12/12/20 0443  NA 137 133* 137 137  K 5.3* 4.9 4.1 3.9  CL 100 99 103 101  CO2 27 28 25 26   GLUCOSE 162* 112* 114* 110*  BUN 31* 29* 21* 21*  CREATININE 0.84 0.78 0.50* 0.75  CALCIUM 8.8* 8.5* 8.5* 8.6*  MG  --   --  2.2 2.3  PHOS  --   --   --  4.3    GFR: Estimated Creatinine Clearance: 110.8 mL/min (by C-G formula based on SCr of 0.75 mg/dL).  Liver Function Tests: Recent Labs  Lab 12/06/20 0415 12/07/20 0531 12/11/20 0437 12/12/20 0443  AST 21 19 15 16   ALT 46* 44 36 37  ALKPHOS 48 53 51 52  BILITOT 0.8 1.0 0.8 0.9  PROT 6.0* 6.1* 5.4* 5.7*  ALBUMIN 2.7* 2.8* 2.7* 2.8*    CBG: Recent Labs  Lab 12/11/20 0804 12/11/20 1114 12/11/20 1556 12/11/20 2115 12/12/20 0755  GLUCAP 109* 155*  167* 157* 112*     No results found for this or any previous visit (from the past 240 hour(s)).       Radiology Studies: DG CHEST PORT 1 VIEW  Result Date: 12/12/2020 CLINICAL DATA:  60 year old male COVID-19.  Pneumomediastinum. EXAM: PORTABLE CHEST 1 VIEW COMPARISON:  Portable chest 12/11/2020 and earlier. FINDINGS: Portable AP semi upright view at 0517 hours. Evidence of pneumomediastinum and small volume right lower neck subcutaneous gas has not significantly changed since yesterday. Stable lung volumes. Mediastinal contours remain normal. No pneumothorax identified. Patchy and confluent peripheral lung opacity is stable. Negative visible bowel gas, osseous  structures. IMPRESSION: Stable COVID-19 pneumonia with pneumomediastinum. No pneumothorax identified. Electronically Signed   By: Odessa Fleming M.D.   On: 12/12/2020 07:32   DG CHEST PORT 1 VIEW  Result Date: 12/11/2020 CLINICAL DATA:  Respiratory failure secondary to bilateral COVID-19 pneumonia. EXAM: PORTABLE CHEST 1 VIEW COMPARISON:  CTA of the chest on 12/03/2020, chest x-rays on 12/01/2020, 11/28/2020 and 11/24/2020 FINDINGS: The heart size and mediastinal contours are within normal limits. There is new subcutaneous air in the soft tissues of the right supraclavicular region and base of the right neck as well as new diffuse pneumomediastinum. No definite pneumothorax visualized. No visible pleural effusions. Progressive bilateral pneumonia since prior chest x-rays in [place] [place] similar distribution to the chest CTA. Involvement of the left lung is slightly more dense than the right by x-ray. The visualized skeletal structures are unremarkable. IMPRESSION: 1. New subcutaneous air in the right supraclavicular region and base of the right neck and new diffuse pneumomediastinum. No definite pneumothorax is visualized. 2. Progressive bilateral pneumonia since prior chest x-rays in [place] similar distribution to the recent chest CTA. 3. These results will be  called to the ordering clinician or representative by the Radiologist Assistant, and communication documented in the PACS or [employer]. Electronically Signed   By: Irish Lack M.D.   On: 12/11/2020 10:02        Scheduled Meds: . vitamin C  500 mg Oral Daily  . aspirin EC  81 mg Oral Daily  . carvedilol  3.125 mg Oral BID  . chlorpheniramine-HYDROcodone  5 mL Oral Q12H  . enoxaparin (LOVENOX) injection  40 mg Subcutaneous Q24H  . insulin aspart  0-9 Units Subcutaneous TID WC  . Ipratropium-Albuterol  1 puff Inhalation TID  . mouth rinse  15 mL Mouth Rinse BID  . pantoprazole  40 mg Oral Daily  . polyethylene glycol  17 g Oral Daily  . pravastatin  40 mg Oral Daily  . predniSONE  20 mg Oral Q breakfast  . saline  1 application Each Nare Q12H  . sodium chloride flush  3 mL Intravenous Q12H  . zinc sulfate  220 mg Oral Daily   Continuous Infusions:   LOS: 18 days    Time spent: over 30 min    Lacretia Nicks, MD [facility]   To contact the attending provider between 7A-7P or the covering provider during after hours 7P-7A, please log into the web site [email or web address] and access using universal [place] password for that web site. If you do not have the password, please call the hospital operator.  12/12/2020, 9:54 AM

## 2020-12-12 NOTE — Plan of Care (Signed)
  Problem: Health Behavior/Discharge Planning: Goal: Ability to manage health-related needs will improve Outcome: Progressing   Problem: Clinical Measurements: Goal: Ability to maintain clinical measurements within normal limits will improve Outcome: Progressing Goal: Will remain free from infection Outcome: Progressing Goal: Diagnostic test results will improve Outcome: Progressing Goal: Respiratory complications will improve Outcome: Progressing Goal: Cardiovascular complication will be avoided Outcome: Progressing   Problem: Activity: Goal: Risk for activity intolerance will decrease Outcome: Progressing   Problem: Nutrition: Goal: Adequate nutrition will be maintained Outcome: Progressing   Problem: Coping: Goal: Level of anxiety will decrease Outcome: Progressing   Problem: Elimination: Goal: Will not experience complications related to bowel motility Outcome: Progressing Goal: Will not experience complications related to urinary retention Outcome: Progressing   Problem: Pain Managment: Goal: General experience of comfort will improve Outcome: Progressing   Problem: Safety: Goal: Ability to remain free from injury will improve Outcome: Progressing   Problem: Skin Integrity: Goal: Risk for impaired skin integrity will decrease Outcome: Progressing   Problem: Education: Goal: Knowledge of risk factors and measures for prevention of condition will improve Outcome: Progressing   Problem: Coping: Goal: Psychosocial and spiritual needs will be supported Outcome: Progressing   Problem: Respiratory: Goal: Will maintain a patent airway Outcome: Progressing Goal: Complications related to the disease process, condition or treatment will be avoided or minimized Outcome: Progressing   

## 2020-12-13 ENCOUNTER — Inpatient Hospital Stay (HOSPITAL_COMMUNITY): Payer: 59

## 2020-12-13 LAB — CBC WITH DIFFERENTIAL/PLATELET
Abs Immature Granulocytes: 0.15 10*3/uL — ABNORMAL HIGH (ref 0.00–0.07)
Basophils Absolute: 0 10*3/uL (ref 0.0–0.1)
Basophils Relative: 0 %
Eosinophils Absolute: 0.6 10*3/uL — ABNORMAL HIGH (ref 0.0–0.5)
Eosinophils Relative: 5 %
HCT: 44.4 % (ref 39.0–52.0)
Hemoglobin: 14.2 g/dL (ref 13.0–17.0)
Immature Granulocytes: 1 %
Lymphocytes Relative: 23 %
Lymphs Abs: 2.5 10*3/uL (ref 0.7–4.0)
MCH: 27.3 pg (ref 26.0–34.0)
MCHC: 32 g/dL (ref 30.0–36.0)
MCV: 85.4 fL (ref 80.0–100.0)
Monocytes Absolute: 0.9 10*3/uL (ref 0.1–1.0)
Monocytes Relative: 9 %
Neutro Abs: 6.7 10*3/uL (ref 1.7–7.7)
Neutrophils Relative %: 62 %
Platelets: 313 10*3/uL (ref 150–400)
RBC: 5.2 MIL/uL (ref 4.22–5.81)
RDW: 14.6 % (ref 11.5–15.5)
WBC: 10.9 10*3/uL — ABNORMAL HIGH (ref 4.0–10.5)
nRBC: 0 % (ref 0.0–0.2)

## 2020-12-13 LAB — COMPREHENSIVE METABOLIC PANEL
ALT: 33 U/L (ref 0–44)
AST: 15 U/L (ref 15–41)
Albumin: 3 g/dL — ABNORMAL LOW (ref 3.5–5.0)
Alkaline Phosphatase: 59 U/L (ref 38–126)
Anion gap: 8 (ref 5–15)
BUN: 22 mg/dL — ABNORMAL HIGH (ref 6–20)
CO2: 31 mmol/L (ref 22–32)
Calcium: 8.9 mg/dL (ref 8.9–10.3)
Chloride: 99 mmol/L (ref 98–111)
Creatinine, Ser: 1.04 mg/dL (ref 0.61–1.24)
GFR, Estimated: >60 mL/min (ref >60)
Glucose, Bld: 162 mg/dL — ABNORMAL HIGH (ref 70–99)
Potassium: 4.2 mmol/L (ref 3.5–5.1)
Sodium: 138 mmol/L (ref 135–145)
Total Bilirubin: 0.7 mg/dL (ref 0.3–1.2)
Total Protein: 5.9 g/dL — ABNORMAL LOW (ref 6.5–8.1)

## 2020-12-13 LAB — GLUCOSE, CAPILLARY
Glucose-Capillary: 94 mg/dL (ref 70–99)
Glucose-Capillary: 127 mg/dL — ABNORMAL HIGH (ref 70–99)
Glucose-Capillary: 150 mg/dL — ABNORMAL HIGH (ref 70–99)
Glucose-Capillary: 184 mg/dL — ABNORMAL HIGH (ref 70–99)

## 2020-12-13 LAB — D-DIMER, QUANTITATIVE: D-Dimer, Quant: 0.49 ug/mL-FEU (ref 0.00–0.50)

## 2020-12-13 LAB — PHOSPHORUS: Phosphorus: 4 mg/dL (ref 2.5–4.6)

## 2020-12-13 LAB — FERRITIN: Ferritin: 291 ng/mL (ref 24–336)

## 2020-12-13 LAB — MAGNESIUM: Magnesium: 2.4 mg/dL (ref 1.7–2.4)

## 2020-12-13 MED ORDER — FUROSEMIDE 10 MG/ML IJ SOLN
20.0000 mg | Freq: Once | INTRAMUSCULAR | Status: AC
  Administered 2020-12-13: 20 mg via INTRAVENOUS
  Filled 2020-12-13: qty 2

## 2020-12-13 MED ORDER — PREDNISONE 10 MG PO TABS
10.0000 mg | ORAL_TABLET | Freq: Every day | ORAL | Status: DC
  Administered 2020-12-14 – 2020-12-15 (×2): 10 mg via ORAL
  Filled 2020-12-13 (×2): qty 1

## 2020-12-13 NOTE — Progress Notes (Signed)
Physical Therapy Treatment Patient Details Name: Curtis Rosales MRN: [id number] DOB: [birth year] Today's Date: 12/13/2020    History of Present Illness Pt is 59 year old male unvaccinated for COVID-19 with past medical history for coronary artery disease, ischemic cardiomyopathy who tested positive for COVID-19 11/18/2020. Now admitted with diagnosis of COVID pneumonia. Found to have pneumomediastinum 2/9.    PT Comments    Pt in bed on 10 L HFNC at rest 94%. Assisted OOB to amb to bathroom. General transfer comment: Good safety cognition and good use of hands to steady self.  Also asissted with a toilet transfer.  Pt on 10 L HFNC with sats at rest 94% and with activity decreased briefly to low 80's but quick  return with rest.  Pt did NOT require NRB this time.  Spent approx 12 min on toilet for a BM.  At rest on 10 L HFNC sats avg 91%, General Gait Details: pt amb to and from bathroom on 10 L HRNC approx 8 feet with light assist for balance.  Sats avg 88% with HR 86 and 2/4 dyspnea.  Brief moment of sats 84% once reached toilet.  Good rebound with rest.  No need for NRB this time.  RN stated "last time he went into the bathroom (Sunday) pt desated requring 15 L HFNC + NRB)"   Follow Up Recommendations  Outpatient PT  Per evaluating LPT because pt does not have Insurance for [facility]     Equipment Recommendations  None recommended by PT    Recommendations for Other Services       Precautions / Restrictions Precautions Precautions: None Precaution Comments: Monitor Sats, Do not use IS or FV due to pneumomediastinum    Mobility  Bed Mobility Overal bed mobility: Modified Independent             General bed mobility comments: self able with increased time    Transfers Overall transfer level: Needs assistance Equipment used: Rolling walker (2 wheeled) Transfers: Sit to/from [employer] Sit to Stand: Supervision Stand pivot transfers: Min guard       General  transfer comment: Good safety cognition and good use of hands to steady self.  Also asissted with a toilet transfer.  Pt on 10 L HFNC with sats at rest 94% and with activity decreased briefly to low 80's but quick  return with rest.  Pt did NOT require NRB this time.  Spent approx 12 min on toilet for a BM.  At rest on 10 L HFNC sats avg 91%,  Ambulation/Gait Ambulation/Gait assistance: Supervision;Min guard Gait Distance (Feet): 16 Feet (8 feet x 2 to and from bathroom) Assistive device: None Gait Pattern/deviations: Step-through pattern;Decreased stride length Gait velocity: decreased   General Gait Details: pt amb to and from bathroom on 10 L HRNC approx 8 feet with light assist for balance.  Sats avg 88% with HR 86 and 2/4 dyspnea.  Brief moment of sats 84% once reached toilet.  Good rebound with rest.  No need for NRB this time.  RN stated "last time he went into the bathroom (Sunday) pt desated requring 15 L HFNC + NRB)"   Stairs             Wheelchair Mobility    Modified Rankin (Stroke Patients Only)       Balance  Cognition Arousal/Alertness: Awake/alert Behavior During Therapy: WFL for tasks assessed/performed Overall Cognitive Status: Within Functional Limits for tasks assessed                                 General Comments: AxO x 3 very willing but anxious with mobility      Exercises      General Comments        Pertinent Vitals/Pain Pain Assessment: No/denies pain    Home Living                      Prior Function            PT Goals (current goals can now be found in the care plan section) Progress towards PT goals: Progressing toward goals    Frequency    Min 3X/week      PT Plan Current plan remains appropriate    Co-evaluation              AM-PAC PT "6 Clicks" Mobility   Outcome Measure  Help needed turning from your back to your side  while in a flat bed without using bedrails?: None Help needed moving from lying on your back to sitting on the side of a flat bed without using bedrails?: None Help needed moving to and from a bed to a chair (including a wheelchair)?: None Help needed standing up from a chair using your arms (e.g., wheelchair or bedside chair)?: None Help needed to walk in hospital room?: A Little Help needed climbing 3-5 steps with a railing? : A Little 6 Click Score: 22    End of Session Equipment Utilized During Treatment: Oxygen;Gait belt Activity Tolerance: Patient tolerated treatment well Patient left: with call bell/phone within reach;in bed Nurse Communication: Mobility status;Other (comment) PT Visit Diagnosis: Difficulty in walking, not elsewhere classified (R26.2)     Time: 6160-7371 PT Time Calculation (min) (ACUTE ONLY): 26 min  Charges:  $Gait Training: 8-22 mins $Therapeutic Activity: 8-22 mins                     Felecia Shelling  PTA Acute  Rehabilitation Services Pager      [phone] Office      [phone]

## 2020-12-13 NOTE — Progress Notes (Signed)
I spoke with pt by phone due to covid precautions. Pt states he's having a good day.  The chaplain offered support, no needs at present.

## 2020-12-13 NOTE — TOC Progression Note (Signed)
Transition of Care [facility]) - Progression Note    Patient Details  Name: Curtis Rosales MRN: [id number] Date of Birth: 1960-11-15  Transition of Care [facility]) CM/SW Contact  Geni Bers, RN Phone Number: 12/13/2020, 3:58 PM  Clinical Narrative:    Pt is not stable for discharge. O2 9L at present time.    Expected Discharge Plan: Home/Self Care Barriers to Discharge: Continued Medical Work up  Expected Discharge Plan and Services Expected Discharge Plan: Home/Self Care   Discharge Planning Services: CM Consult   Living arrangements for the past 2 months: Single Family Home                                       Social Determinants of Health (SDOH) Interventions    Readmission Risk Interventions No flowsheet data found.

## 2020-12-13 NOTE — Plan of Care (Signed)
Patient reports less dyspnea on exertion, able to wean patient down to 6 liters HFNC on 7 a to 7 p shift.

## 2020-12-13 NOTE — Progress Notes (Signed)
PROGRESS NOTE    Curtis Rosales  XBM:[id number] DOB: 1961/03/03 DOA: 11/24/2020 PCP: Ambrose Mantle, MD   Chief Complaint  Patient presents with  . Covid Positive   Brief Narrative:  Curtis Rosales is Curtis Rosales 60 year old male with past medical history significant for CAD, ischemic cardiomyopathy, essential hypertension, hyperlipidemia who presents to the ED with body aches, cough, fevers and progressive shortness of breath.  Patient is unvaccinated against Covid-19 with positive Covid-19 test on 11/18/2020.  Patient received steroids outpatient with no improvement of symptoms.  He was admitted for acute hypoxic respiratory failure 2/2 COVID 19 pneumonia.   Assessment & Plan:   Principal Problem:   Pneumonia due to COVID-19 virus Active Problems:   Cardiomyopathy, ischemic   CAD (coronary artery disease)   Pure hypercholesterolemia   Acute respiratory disease due to COVID-19 virus   Acute respiratory failure with hypoxia (HCC)  Acute hypoxic respiratory failure secondary to acute Covid-19 viral pneumonia  Patient presenting with progressive shortness of breath associated with body aches, fever, chills.  Initially tested positive for Covid-19 on 11/18/2020, attempted treatment outpatient with steroids unsuccessful with progression of disease process.  Patient was noted to be hypoxic on presentation requiring submental oxygen. --COVID test: 11/18/2020 -- Currently requiring 9 L [place] -- CXR 2/9 with pneumomediastinum and progressive bilateral pneumonia (see report) -- CXR 2/10 stable.  2/11 CXR with pneumomediastinum/subcutaneous air, multifocal pneumonia. -- CXR 2/12 --Completed 5-day course of remdesivir on 11/28/2020 --Steroids 1/23 - present.  Tapering.  --Did not receive actemra/baricitinib.  Could consider if worsening, but normal CRP and appears stable - and now day 16, low suspicion this would be beneficial at this time.  -- Strict I/O, daily weights - dependent edema, trial lasix again today   --Prone as able, OOB.  Therapy.    COVID-19 Labs  Recent Labs    12/11/20 0437 12/12/20 0443 12/13/20 0432  DDIMER 0.39 0.41 0.49  FERRITIN  --   --  291  CRP <0.5 0.5  --     Lab Results  Component Value Date   SARSCOV2NAA POSITIVE ([name]) 11/18/2020   Pneumomediastinum  CXR as noted above Daily CXR.  Stop IS.  Avoid positive pressure ventilation if possible.  Essential hypertension Ischemic cardiomyopathy --Carvedilol 3.125 mg p.o. twice daily --Aspirin 81 mg p.o. daily and statin  HLD: Pravastatin 40 mg p.o. daily  GERD: Protonix 40 mg p.o. daily  DVT prophylaxis: lovenox Code Status: full  Family Communication: none at bedside - wife 2/9 over phone Disposition:   Status is: Inpatient  Remains inpatient appropriate because:Inpatient level of care appropriate due to severity of illness   Dispo: The patient is from: Home              Anticipated d/c is to: pending              Anticipated d/c date is: > 3 days              Patient currently is not medically stable to d/c.   Difficult to place patient No  Consultants:   none  Procedures:  LE [place] Summary:  BILATERAL:  - No evidence of deep vein thrombosis seen in the lower extremities,  bilaterally.  -No evidence of popliteal cyst, bilaterally.   Antimicrobials: Anti-infectives (From admission, onward)   Start     Dose/Rate Route Frequency Ordered Stop   11/25/20 1000  remdesivir 100 mg in sodium chloride 0.9 % 100 mL IVPB       "  Followed by" Linked Group Details   100 mg 200 mL/hr over 30 Minutes Intravenous Daily 11/24/20 2033 11/28/20 1100   11/24/20 2200  remdesivir 200 mg in sodium chloride 0.9% 250 mL IVPB       "Followed by" Linked Group Details   200 mg 580 mL/hr over 30 Minutes Intravenous Once 11/24/20 2033 11/24/20 2310     Subjective: No new complaints Asks about timeline  Objective: Vitals:   12/12/20 1600 12/12/20 2000 12/13/20 0549 12/13/20 0900  BP: 116/74 112/70 111/68    Pulse: 88 85 79   Resp: 18 20 20    Temp: 98.6 F (37 C) 98.2 F (36.8 C) 98.4 F (36.9 C)   TempSrc: Oral Oral Oral   SpO2: 95% 95% 95% 95%  Weight:      Height:        Intake/Output Summary (Last 24 hours) at 12/13/2020 1037 Last data filed at 12/12/2020 2000 Gross per 24 hour  Intake --  Output 800 ml  Net -800 ml   Filed Weights   11/26/20 1120  Weight: 94.3 kg    Examination:  General: No acute distress. Cardiovascular: Heart sounds show [name] regular rate, and rhythm Lungs: Clear to auscultation bilaterally Abdomen: Soft, nontender, nondistended  Neurological: Alert and oriented 3. Moves all extremities 4. Cranial nerves II through XII grossly intact. Skin: Warm and dry. No rashes or lesions. Extremities: No clubbing or cyanosis. No edema.   Data Reviewed: I have personally reviewed following labs and imaging studies  CBC: Recent Labs  Lab 12/07/20 0531 12/11/20 0437 12/12/20 0443 12/13/20 0432  WBC 12.3* 11.6* 11.2* 10.9*  NEUTROABS  --   --  7.2 6.7  HGB 14.8 13.6 14.3 14.2  HCT 44.3 42.4 44.9 44.4  MCV 83.9 85.5 85.0 85.4  PLT 520* 345 321 313    Basic Metabolic Panel: Recent Labs  Lab 12/07/20 0531 12/11/20 0437 12/12/20 0443 12/13/20 0432  NA 133* 137 137 138  K 4.9 4.1 3.9 4.2  CL 99 103 101 99  CO2 28 25 26 31   GLUCOSE 112* 114* 110* 162*  BUN 29* 21* 21* 22*  CREATININE 0.78 0.50* 0.75 1.04  CALCIUM 8.5* 8.5* 8.6* 8.9  MG  --  2.2 2.3 2.4  PHOS  --   --  4.3 4.0    GFR: Estimated Creatinine Clearance: 85.2 mL/min (by C-G formula based on SCr of 1.04 mg/dL).  Liver Function Tests: Recent Labs  Lab 12/07/20 0531 12/11/20 0437 12/12/20 0443 12/13/20 0432  AST 19 15 16 15   ALT 44 36 37 33  ALKPHOS 53 51 52 59  BILITOT 1.0 0.8 0.9 0.7  PROT 6.1* 5.4* 5.7* 5.9*  ALBUMIN 2.8* 2.7* 2.8* 3.0*    CBG: Recent Labs  Lab 12/12/20 0755 12/12/20 1134 12/12/20 1630 12/12/20 2112 12/13/20 0753  GLUCAP 112* 168* 155* 115* 94      No results found for this or any previous visit (from the past 240 hour(s)).       Radiology Studies: DG CHEST PORT 1 VIEW  Result Date: 12/13/2020 CLINICAL DATA:  COVID-19 positive.  Pneumomediastinum. EXAM: PORTABLE CHEST 1 VIEW COMPARISON:  December 11, 2020 and December 12, 2020 FINDINGS: Pneumomediastinum again noted, perhaps slightly less than on 1 day prior. Subcutaneous air is noted in the right supraclavicular region. No evident pneumothorax. Areas of patchy airspace opacity bilaterally persist without significant change. Heart is upper normal in size with pulmonary vascularity normal. No adenopathy. No bone lesions. IMPRESSION: Pneumomediastinum  and subcutaneous air again noted. No pneumothorax evident. Patchy airspace opacity consistent with multifocal pneumonia again noted, stable. No new opacity. Stable cardiac silhouette Electronically Signed   By: Bretta Bang III M.D.   On: 12/13/2020 07:51   DG CHEST PORT 1 VIEW  Result Date: 12/12/2020 CLINICAL DATA:  60 year old male COVID-19.  Pneumomediastinum. EXAM: PORTABLE CHEST 1 VIEW COMPARISON:  Portable chest 12/11/2020 and earlier. FINDINGS: Portable AP semi upright view at 0517 hours. Evidence of pneumomediastinum and small volume right lower neck subcutaneous gas has not significantly changed since yesterday. Stable lung volumes. Mediastinal contours remain normal. No pneumothorax identified. Patchy and confluent peripheral lung opacity is stable. Negative visible bowel gas, osseous structures. IMPRESSION: Stable COVID-19 pneumonia with pneumomediastinum. No pneumothorax identified. Electronically Signed   By: Odessa Fleming M.D.   On: 12/12/2020 07:32        Scheduled Meds: . vitamin C  500 mg Oral Daily  . aspirin EC  81 mg Oral Daily  . carvedilol  3.125 mg Oral BID  . chlorpheniramine-HYDROcodone  5 mL Oral Q12H  . enoxaparin (LOVENOX) injection  40 mg Subcutaneous Q24H  . insulin aspart  0-9 Units Subcutaneous TID WC   . Ipratropium-Albuterol  1 puff Inhalation TID  . mouth rinse  15 mL Mouth Rinse BID  . pantoprazole  40 mg Oral Daily  . polyethylene glycol  17 g Oral Daily  . pravastatin  40 mg Oral Daily  . predniSONE  20 mg Oral Q breakfast  . saline  1 application Each Nare Q12H  . sodium chloride flush  3 mL Intravenous Q12H  . zinc sulfate  220 mg Oral Daily   Continuous Infusions:   LOS: 19 days    Time spent: over 30 min    Lacretia Nicks, MD [facility]   To contact the attending provider between 7A-7P or the covering provider during after hours 7P-7A, please log into the web site [email or web address] and access using universal [place] password for that web site. If you do not have the password, please call the hospital operator.  12/13/2020, 10:37 AM

## 2020-12-14 LAB — COMPREHENSIVE METABOLIC PANEL
ALT: 32 U/L (ref 0–44)
AST: 14 U/L — ABNORMAL LOW (ref 15–41)
Albumin: 2.8 g/dL — ABNORMAL LOW (ref 3.5–5.0)
Alkaline Phosphatase: 62 U/L (ref 38–126)
Anion gap: 11 (ref 5–15)
BUN: 19 mg/dL (ref 6–20)
CO2: 26 mmol/L (ref 22–32)
Calcium: 8.5 mg/dL — ABNORMAL LOW (ref 8.9–10.3)
Chloride: 100 mmol/L (ref 98–111)
Creatinine, Ser: 0.75 mg/dL (ref 0.61–1.24)
GFR, Estimated: >60 mL/min (ref >60)
Glucose, Bld: 109 mg/dL — ABNORMAL HIGH (ref 70–99)
Potassium: 3.7 mmol/L (ref 3.5–5.1)
Sodium: 137 mmol/L (ref 135–145)
Total Bilirubin: 0.7 mg/dL (ref 0.3–1.2)
Total Protein: 5.7 g/dL — ABNORMAL LOW (ref 6.5–8.1)

## 2020-12-14 LAB — CBC WITH DIFFERENTIAL/PLATELET
Abs Immature Granulocytes: 0.23 10*3/uL — ABNORMAL HIGH (ref 0.00–0.07)
Basophils Absolute: 0 10*3/uL (ref 0.0–0.1)
Basophils Relative: 0 %
Eosinophils Absolute: 0.4 10*3/uL (ref 0.0–0.5)
Eosinophils Relative: 3 %
HCT: 43.9 % (ref 39.0–52.0)
Hemoglobin: 14.3 g/dL (ref 13.0–17.0)
Immature Granulocytes: 2 %
Lymphocytes Relative: 19 %
Lymphs Abs: 2.5 10*3/uL (ref 0.7–4.0)
MCH: 27.4 pg (ref 26.0–34.0)
MCHC: 32.6 g/dL (ref 30.0–36.0)
MCV: 84.1 fL (ref 80.0–100.0)
Monocytes Absolute: 1 10*3/uL (ref 0.1–1.0)
Monocytes Relative: 8 %
Neutro Abs: 8.8 10*3/uL — ABNORMAL HIGH (ref 1.7–7.7)
Neutrophils Relative %: 68 %
Platelets: 285 10*3/uL (ref 150–400)
RBC: 5.22 MIL/uL (ref 4.22–5.81)
RDW: 14.5 % (ref 11.5–15.5)
WBC: 13 10*3/uL — ABNORMAL HIGH (ref 4.0–10.5)
nRBC: 0 % (ref 0.0–0.2)

## 2020-12-14 LAB — GLUCOSE, CAPILLARY
Glucose-Capillary: 99 mg/dL (ref 70–99)
Glucose-Capillary: 155 mg/dL — ABNORMAL HIGH (ref 70–99)
Glucose-Capillary: 124 mg/dL — ABNORMAL HIGH (ref 70–99)
Glucose-Capillary: 141 mg/dL — ABNORMAL HIGH (ref 70–99)

## 2020-12-14 LAB — C-REACTIVE PROTEIN: CRP: 0.7 mg/dL (ref <1.0)

## 2020-12-14 LAB — D-DIMER, QUANTITATIVE: D-Dimer, Quant: 0.34 ug/mL-FEU (ref 0.00–0.50)

## 2020-12-14 LAB — MAGNESIUM: Magnesium: 2.3 mg/dL (ref 1.7–2.4)

## 2020-12-14 LAB — FERRITIN: Ferritin: 287 ng/mL (ref 24–336)

## 2020-12-14 LAB — PHOSPHORUS: Phosphorus: 3.5 mg/dL (ref 2.5–4.6)

## 2020-12-14 NOTE — Progress Notes (Signed)
SATURATION QUALIFICATIONS: (This note is used to comply with regulatory documentation for home oxygen)  Patient Saturations on Room Air at Rest = 83%  Patient Saturations on Room Air while Ambulating = 76%  Patient Saturations on 4 Liters of oxygen while Ambulating = 90%  Please briefly explain why patient needs home oxygen: desaturation at rest and with exertion without oxygen

## 2020-12-14 NOTE — Progress Notes (Signed)
Patient ambulatory in hallway on 6 liters, maintained oxygen saturation in the mid 90's.  Tolerated well.  Patient remains on 2 liters at rest.

## 2020-12-14 NOTE — Plan of Care (Signed)
  Problem: Clinical Measurements: Goal: Will remain free from infection Outcome: Progressing Goal: Respiratory complications will improve Outcome: Progressing   Problem: Activity: Goal: Risk for activity intolerance will decrease Outcome: Progressing   Problem: Pain Managment: Goal: General experience of comfort will improve Outcome: Progressing   Problem: Safety: Goal: Ability to remain free from injury will improve Outcome: Progressing   Problem: Skin Integrity: Goal: Risk for impaired skin integrity will decrease Outcome: Progressing   

## 2020-12-14 NOTE — Progress Notes (Addendum)
PROGRESS NOTE    Curtis Rosales  KJZ:[id number] DOB: [birth year] DOA: 11/24/2020 PCP: Ambrose Mantle, MD   Chief Complaint  Patient presents with  . Covid Positive   Brief Narrative:  Curtis Rosales is a 60 year old male with past medical history significant for CAD, ischemic cardiomyopathy, essential hypertension, hyperlipidemia who presents to the ED with body aches, cough, fevers and progressive shortness of breath.  Patient is unvaccinated against Covid-19 with positive Covid-19 test on 11/18/2020.  Patient received steroids outpatient with no improvement of symptoms.  He was admitted for acute hypoxic respiratory failure 2/2 COVID 19 pneumonia.   Assessment & Plan:   Principal Problem:   Pneumonia due to COVID-19 virus Active Problems:   Cardiomyopathy, ischemic   CAD (coronary artery disease)   Pure hypercholesterolemia   Acute respiratory disease due to COVID-19 virus   Acute respiratory failure with hypoxia (HCC)  Acute hypoxic respiratory failure secondary to acute Covid-19 viral pneumonia  Patient presenting with progressive shortness of breath associated with body aches, fever, chills.  Initially tested positive for Covid-19 on 11/18/2020, attempted treatment outpatient with steroids unsuccessful with progression of disease process.  Patient was noted to be hypoxic on presentation requiring submental oxygen. --COVID test: 11/18/2020 -- Currently requiring 2 L [place] -> follow home O2 screen -- CXR 2/9 with pneumomediastinum and progressive bilateral pneumonia (see report) -- CXR 2/10 stable.  2/11 CXR with pneumomediastinum/subcutaneous air, multifocal pneumonia. -- CXR 2/11 with patchy heterogenous bilateral lung opacities, small amount of subcutaenous emphysemia in R supraclavicular region.  Pneumomediastinum not as well demonstrated. -- CXR 2/13 pending --Completed 5-day course of remdesivir on 11/28/2020 --Steroids 1/23 - present.  Tapering.  --Did not receive actemra/baricitinib.   Could consider if worsening, but normal CRP and appears stable - and now day 16, low suspicion this would be beneficial at this time.  -- Strict I/O, daily weights - s/p lasix --Prone as able, OOB.  Therapy.   -- >21 days from positive test, can d/c isolation  COVID-19 Labs  Recent Labs    12/12/20 0443 12/13/20 0432 12/14/20 0453  DDIMER 0.41 0.49 0.34  FERRITIN  --  291 287  CRP 0.5  --  0.7    Lab Results  Component Value Date   SARSCOV2NAA POSITIVE (A) 11/18/2020   Pneumomediastinum  CXR as noted above Daily CXR.  Stop IS.  Avoid positive pressure ventilation if possible.  Essential hypertension Ischemic cardiomyopathy --Carvedilol 3.125 mg p.o. twice daily --Aspirin 81 mg p.o. daily and statin  HLD: Pravastatin 40 mg p.o. daily  GERD: Protonix 40 mg p.o. daily  DVT prophylaxis: lovenox Code Status: full  Family Communication: none at bedside - wife 2/9 over phone Disposition:   Status is: Inpatient  Remains inpatient appropriate because:Inpatient level of care appropriate due to severity of illness   Dispo: The patient is from: Home              Anticipated d/c is to: pending              Anticipated d/c date is: > 3 days              Patient currently is not medically stable to d/c.   Difficult to place patient No  Consultants:   none  Procedures:  LE [place] Summary:  BILATERAL:  - No evidence of deep vein thrombosis seen in the lower extremities,  bilaterally.  -No evidence of popliteal cyst, bilaterally.   Antimicrobials: Anti-infectives (From admission, onward)  Start     Dose/Rate Route Frequency Ordered Stop   11/25/20 1000  remdesivir 100 mg in sodium chloride 0.9 % 100 mL IVPB       "Followed by" Linked Group Details   100 mg 200 mL/hr over 30 Minutes Intravenous Daily 11/24/20 2033 11/28/20 1100   11/24/20 2200  remdesivir 200 mg in sodium chloride 0.9% 250 mL IVPB       "Followed by" Linked Group Details   200 mg 580 mL/hr over  30 Minutes Intravenous Once 11/24/20 2033 11/24/20 2310     Subjective: No new complaints  Objective: Vitals:   12/14/20 0829 12/14/20 0915 12/14/20 1149 12/14/20 1516  BP:   95/67   Pulse:   81   Resp:   20   Temp:   98.4 F (36.9 C)   TempSrc:   Oral   SpO2: 100% 97% 90% 96%  Weight:      Height:        Intake/Output Summary (Last 24 hours) at 12/14/2020 1706 Last data filed at 12/14/2020 1301 Gross per 24 hour  Intake 720 ml  Output 1200 ml  Net -480 ml   Filed Weights   11/26/20 1120  Weight: 94.3 kg    Examination:  General: No acute distress. Cardiovascular: Heart sounds show a regular rate, and rhythm. Lungs: on 2 L [place], unlabored, no appreciated adventitious lung sounds Abdomen: Soft, nontender, nondistended  Neurological: Alert and oriented 3. Moves all extremities 4. Cranial nerves II through XII grossly intact. Skin: Warm and dry. No rashes or lesions. Extremities: No clubbing or cyanosis. No edema.   Data Reviewed: I have personally reviewed following labs and imaging studies  CBC: Recent Labs  Lab 12/11/20 0437 12/12/20 0443 12/13/20 0432 12/14/20 0453  WBC 11.6* 11.2* 10.9* 13.0*  NEUTROABS  --  7.2 6.7 8.8*  HGB 13.6 14.3 14.2 14.3  HCT 42.4 44.9 44.4 43.9  MCV 85.5 85.0 85.4 84.1  PLT 345 321 313 285    Basic Metabolic Panel: Recent Labs  Lab 12/11/20 0437 12/12/20 0443 12/13/20 0432 12/14/20 0453  NA 137 137 138 137  K 4.1 3.9 4.2 3.7  CL 103 101 99 100  CO2 25 26 31 26   GLUCOSE 114* 110* 162* 109*  BUN 21* 21* 22* 19  CREATININE 0.50* 0.75 1.04 0.75  CALCIUM 8.5* 8.6* 8.9 8.5*  MG 2.2 2.3 2.4 2.3  PHOS  --  4.3 4.0 3.5    GFR: Estimated Creatinine Clearance: 110.8 mL/min (by C-G formula based on SCr of 0.75 mg/dL).  Liver Function Tests: Recent Labs  Lab 12/11/20 0437 12/12/20 0443 12/13/20 0432 12/14/20 0453  AST 15 16 15  14*  ALT 36 37 33 32  ALKPHOS 51 52 59 62  BILITOT 0.8 0.9 0.7 0.7  PROT 5.4* 5.7* 5.9*  5.7*  ALBUMIN 2.7* 2.8* 3.0* 2.8*    CBG: Recent Labs  Lab 12/13/20 1700 12/13/20 2022 12/14/20 0734 12/14/20 1144 12/14/20 1630  GLUCAP 150* 184* 99 155* 124*     No results found for this or any previous visit (from the past 240 hour(s)).       Radiology Studies: DG CHEST PORT 1 VIEW  Result Date: 12/13/2020 CLINICAL DATA:  Shortness of breath.  COVID positive. EXAM: PORTABLE CHEST 1 VIEW COMPARISON:  Radiograph earlier today. FINDINGS: Stable lung volumes. Patchy heterogeneous bilateral lung opacities that significant change from prior. Stable heart size and mediastinal contours. No pleural fluid. No pneumothorax. Small amount of subcutaneous  emphysema in the right supraclavicular region again seen. No pneumomediastinum is less well appreciated currently. IMPRESSION: 1. Patchy heterogeneous bilateral lung opacities consistent with COVID-19 pneumonia, not significantly changed from exam earlier this day. 2. Small amount of subcutaneous emphysema in the right supraclavicular region. Pneumomediastinum on prior exam is not as well demonstrated. Electronically Signed   By: Narda Rutherford M.D.   On: 12/13/2020 19:09   DG CHEST PORT 1 VIEW  Result Date: 12/13/2020 CLINICAL DATA:  COVID-19 positive.  Pneumomediastinum. EXAM: PORTABLE CHEST 1 VIEW COMPARISON:  December 11, 2020 and December 12, 2020 FINDINGS: Pneumomediastinum again noted, perhaps slightly less than on 1 day prior. Subcutaneous air is noted in the right supraclavicular region. No evident pneumothorax. Areas of patchy airspace opacity bilaterally persist without significant change. Heart is upper normal in size with pulmonary vascularity normal. No adenopathy. No bone lesions. IMPRESSION: Pneumomediastinum and subcutaneous air again noted. No pneumothorax evident. Patchy airspace opacity consistent with multifocal pneumonia again noted, stable. No new opacity. Stable cardiac silhouette Electronically Signed   By: Bretta Bang III M.D.   On: 12/13/2020 07:51        Scheduled Meds: . vitamin C  500 mg Oral Daily  . aspirin EC  81 mg Oral Daily  . carvedilol  3.125 mg Oral BID  . chlorpheniramine-HYDROcodone  5 mL Oral Q12H  . enoxaparin (LOVENOX) injection  40 mg Subcutaneous Q24H  . insulin aspart  0-9 Units Subcutaneous TID WC  . Ipratropium-Albuterol  1 puff Inhalation TID  . mouth rinse  15 mL Mouth Rinse BID  . pantoprazole  40 mg Oral Daily  . polyethylene glycol  17 g Oral Daily  . pravastatin  40 mg Oral Daily  . predniSONE  10 mg Oral Q breakfast  . saline  1 application Each Nare Q12H  . sodium chloride flush  3 mL Intravenous Q12H  . zinc sulfate  220 mg Oral Daily   Continuous Infusions:   LOS: 20 days    Time spent: over 30 min    Lacretia Nicks, MD [facility]   To contact the attending provider between 7A-7P or the covering provider during after hours 7P-7A, please log into the web site [email or web address] and access using universal [place] password for that web site. If you do not have the password, please call the hospital operator.  12/14/2020, 5:06 PM

## 2020-12-15 ENCOUNTER — Inpatient Hospital Stay (HOSPITAL_COMMUNITY): Payer: 59

## 2020-12-15 LAB — CBC WITH DIFFERENTIAL/PLATELET
Abs Immature Granulocytes: 0.16 10*3/uL — ABNORMAL HIGH (ref 0.00–0.07)
Basophils Absolute: 0 10*3/uL (ref 0.0–0.1)
Basophils Relative: 0 %
Eosinophils Absolute: 0.6 10*3/uL — ABNORMAL HIGH (ref 0.0–0.5)
Eosinophils Relative: 5 %
HCT: 41.1 % (ref 39.0–52.0)
Hemoglobin: 13.3 g/dL (ref 13.0–17.0)
Immature Granulocytes: 1 %
Lymphocytes Relative: 23 %
Lymphs Abs: 2.6 10*3/uL (ref 0.7–4.0)
MCH: 27.6 pg (ref 26.0–34.0)
MCHC: 32.4 g/dL (ref 30.0–36.0)
MCV: 85.3 fL (ref 80.0–100.0)
Monocytes Absolute: 0.9 10*3/uL (ref 0.1–1.0)
Monocytes Relative: 8 %
Neutro Abs: 7.4 10*3/uL (ref 1.7–7.7)
Neutrophils Relative %: 63 %
Platelets: 269 10*3/uL (ref 150–400)
RBC: 4.82 MIL/uL (ref 4.22–5.81)
RDW: 14.7 % (ref 11.5–15.5)
WBC: 11.7 10*3/uL — ABNORMAL HIGH (ref 4.0–10.5)
nRBC: 0 % (ref 0.0–0.2)

## 2020-12-15 LAB — COMPREHENSIVE METABOLIC PANEL
ALT: 28 U/L (ref 0–44)
AST: 14 U/L — ABNORMAL LOW (ref 15–41)
Albumin: 2.9 g/dL — ABNORMAL LOW (ref 3.5–5.0)
Alkaline Phosphatase: 58 U/L (ref 38–126)
Anion gap: 12 (ref 5–15)
BUN: 17 mg/dL (ref 6–20)
CO2: 25 mmol/L (ref 22–32)
Calcium: 8.7 mg/dL — ABNORMAL LOW (ref 8.9–10.3)
Chloride: 102 mmol/L (ref 98–111)
Creatinine, Ser: 0.75 mg/dL (ref 0.61–1.24)
GFR, Estimated: >60 mL/min (ref >60)
Glucose, Bld: 88 mg/dL (ref 70–99)
Potassium: 4 mmol/L (ref 3.5–5.1)
Sodium: 139 mmol/L (ref 135–145)
Total Bilirubin: 0.6 mg/dL (ref 0.3–1.2)
Total Protein: 5.7 g/dL — ABNORMAL LOW (ref 6.5–8.1)

## 2020-12-15 LAB — PHOSPHORUS: Phosphorus: 4.1 mg/dL (ref 2.5–4.6)

## 2020-12-15 LAB — FERRITIN: Ferritin: 236 ng/mL (ref 24–336)

## 2020-12-15 LAB — C-REACTIVE PROTEIN: CRP: 0.8 mg/dL (ref <1.0)

## 2020-12-15 LAB — GLUCOSE, CAPILLARY
Glucose-Capillary: 83 mg/dL (ref 70–99)
Glucose-Capillary: 135 mg/dL — ABNORMAL HIGH (ref 70–99)

## 2020-12-15 LAB — MAGNESIUM: Magnesium: 2.2 mg/dL (ref 1.7–2.4)

## 2020-12-15 LAB — D-DIMER, QUANTITATIVE: D-Dimer, Quant: 0.37 ug/mL-FEU (ref 0.00–0.50)

## 2020-12-15 MED ORDER — METFORMIN HCL 500 MG PO TABS: 500.0000 mg | ORAL_TABLET | Freq: Two times a day (BID) | ORAL | 0 refills | Status: DC

## 2020-12-15 MED ORDER — ALBUTEROL SULFATE HFA 108 (90 BASE) MCG/ACT IN AERS
2.0000 | INHALATION_SPRAY | RESPIRATORY_TRACT | 0 refills | Status: AC | PRN
Start: 2020-12-15 — End: ?

## 2020-12-15 MED ORDER — HYDROCOD POLST-CPM POLST ER 10-8 MG/5ML PO SUER: 5.0000 mL | Freq: Two times a day (BID) | ORAL | 0 refills | Status: DC | PRN

## 2020-12-15 MED ORDER — BLOOD GLUCOSE MONITOR KIT
PACK | 0 refills | Status: DC
Start: 2020-12-15 — End: 2021-12-04

## 2020-12-15 NOTE — TOC Progression Note (Addendum)
Transition of Care [facility]) - Progression Note    Patient Details  Name: Curtis Rosales MRN: [id number] Date of Birth: 1961-04-02  Transition of Care [facility]) CM/SW Contact  Armanda Heritage, RN Phone Number: 12/15/2020, 12:01 PM  Clinical Narrative:    [employer] to provide home oxygen.  Referral placed to outpatient physical therapy services.   Expected Discharge Plan: Home/Self Care Barriers to Discharge: No Barriers Identified  Expected Discharge Plan and Services Expected Discharge Plan: Home/Self Care   Discharge Planning Services: CM Consult   Living arrangements for the past 2 months: Single Family Home Expected Discharge Date: 12/15/20               DME Arranged: Oxygen DME Agency: [employer] Date DME Agency Contacted: 12/15/20 Time DME Agency Contacted: 1201 Representative spoke with at DME Agency: Lucrecia             Social Determinants of Health (SDOH) Interventions    Readmission Risk Interventions No flowsheet data found.

## 2020-12-15 NOTE — Discharge Summary (Signed)
Physician Discharge Summary  Curtis Rosales YQI:[id number] DOB: 1960-12-19 DOA: 11/24/2020  PCP: [name], MD  Admit date: 11/24/2020 Discharge date: 12/15/2020  Time spent: 40 minutes  Recommendations for Outpatient Follow-up:  1. Follow outpatient CBC/CMP 2. Follow outpatient CXR in [place] few weeks 3. New diabetes, follow outpatient - start on metformin 4. Follow with pulm outpatient for covid and pneumomediastinum 5. Follow with PCP/pulm regarding long term oxygen needs (discharged on 4 L with activity, 2 L at rest)  Discharge Diagnoses:  Principal Problem:   Pneumonia due to COVID-19 virus Active Problems:   Cardiomyopathy, ischemic   CAD (coronary artery disease)   Pure hypercholesterolemia   Acute respiratory disease due to COVID-19 virus   Acute respiratory failure with hypoxia [facility])   Discharge Condition: stable  Diet recommendation: diabetic  Filed Weights   11/26/20 1120  Weight: 94.3 kg    History of present illness:  Spyridon Hornstein Sashia Campas 60 year old male with past medical history significant for CAD, ischemic cardiomyopathy, essential hypertension, hyperlipidemia who presents to the ED with body aches, cough, fevers and progressive shortness of breath. Patient is unvaccinated against Covid-19 with positive Covid-19 test on 11/18/2020. Patient received steroids outpatient with no improvement of symptoms.  He was admitted for acute hypoxic respiratory failure 2/2 COVID 19 pneumonia. He's improved with steroids and remdesivir.  See below for additional details.  Hospital Course:  Acute hypoxic respiratory failure secondary to acute Covid-19 viral pneumonia  Patient presenting with progressive shortness of breath associated with body aches, fever, chills. Initially tested positive for Covid-19 on 11/18/2020, attempted treatment outpatient with steroids unsuccessful with progression of disease process. Patient was noted to be hypoxic on presentation requiring submental  oxygen. --COVID test: 11/18/2020 -- Currently requiring 2 L [place] -> needing 4 L with activity -> d/c home with 4 L supplemental O2 with activity -- CXR 2/9 with pneumomediastinum and progressive bilateral pneumonia (see report) -- CXR 2/10 stable.  2/11 CXR with pneumomediastinum/subcutaneous air, multifocal pneumonia. -- CXR 2/11 with patchy heterogenous bilateral lung opacities, small amount of subcutaenous emphysemia in R supraclavicular region.  Pneumomediastinum not as well demonstrated. -- CXR 2/13 covid pneumonia, regressed pneumomediastinum and neck soft tissue gas --Completed 5-day course of remdesivir on 11/28/2020 --Steroids 1/23 - 2/13 -- Strict I/O, daily weights - s/p lasix --Prone as able, OOB.  Therapy.   -- >21 days from positive test, can d/c isolation  COVID-19 Labs  Recent Labs    12/13/20 0432 12/14/20 0453 12/15/20 0426  DDIMER 0.49 0.34 0.37  FERRITIN 291 287 236  CRP  --  0.7 0.8    Lab Results  Component Value Date   SARSCOV2NAA POSITIVE (Yonathan Perrow) 11/18/2020   Pneumomediastinum  -- CXR 2/13 covid pneumonia, regressed pneumomediastinum and neck soft tissue gas -- improving   Essential hypertension Ischemic cardiomyopathy --Carvedilol 3.125 mg p.o. twice daily --Aspirin 81 mg p.o. daily and statin  LOV:FIEPPIRJJOA 40 mg p.o. daily  GERD:Protonix 40 mg p.o. daily  T2DM: new diagnosis. Start metformin. Follow outpatient with PCP.  Procedures: LE [place]   Summary:  BILATERAL:  - No evidence of deep vein thrombosis seen in the lower extremities,  bilaterally.  -No evidence of popliteal cyst, bilaterally.   Summary:  BILATERAL:  - No evidence of deep vein thrombosis seen in the lower extremities,  bilaterally.  -No evidence of popliteal cyst, bilaterally.   Consultations:  none  Discharge Exam: Vitals:   12/15/20 0559 12/15/20 0745  BP: 116/73   Pulse: 76  Resp: 18   Temp: 98 F (36.7 C)   SpO2: 91% 91%   No new complaints Eager to  go home  General: No acute distress. Cardiovascular: Heart sounds show Desmen Schoffstall regular rate, and rhythm Lungs: Clear to auscultation bilaterally.  No appreciated adventitious lung sounds. Abdomen: Soft, nontender, nondistended  Neurological: Alert and oriented 3. Moves all extremities 4. Cranial nerves II through XII grossly intact. Skin: Warm and dry. No rashes or lesions. Extremities: No clubbing or cyanosis. No edema.  Discharge Instructions   Discharge Instructions    Ambulatory referral to Pulmonology   Complete by: As directed    covid   Reason for referral: Other   Call MD for:  difficulty breathing, headache or visual disturbances   Complete by: As directed    Call MD for:  extreme fatigue   Complete by: As directed    Call MD for:  hives   Complete by: As directed    Call MD for:  persistant dizziness or light-headedness   Complete by: As directed    Call MD for:  persistant nausea and vomiting   Complete by: As directed    Call MD for:  redness, tenderness, or signs of infection (pain, swelling, redness, odor or green/yellow discharge around incision site)   Complete by: As directed    Call MD for:  severe uncontrolled pain   Complete by: As directed    Call MD for:  temperature >100.4   Complete by: As directed    Diet - low sodium heart healthy   Complete by: As directed    Discharge instructions   Complete by: As directed    You've improved from your covid 19 infection after treatment with steroids and remdesivir.  You'll discharge home with oxygen.  You need 4 liters with activity (at rest, you were doing well on 2 L today).  Use 4 L with activity and at night.  Follow up with your PCP to help determine your long term needs.  You had Kyrsten Deleeuw pneumomediastinum.  This has improved.  Follow up Analiese Krupka repeat chest x ray in [place] few weeks.  You should follow up with pulmonology for your long term oxygen needs and to follow up your pneumomediastinum.  You have diabetes.  We'll start  you on metformin.  This can sometimes cause stomach upset.  Start with 500 mg once daily with breakfast.  Take it for 7 days and make sure you tolerate the once daily dosing before increasing to 500 mg twice daily.   Return for new, recurrent, or worsening symptoms.  Please ask your PCP to request records from this hospitalization so they know what was done and what the next steps will be.   Increase activity slowly   Complete by: As directed      Allergies as of 12/15/2020      Reactions   Ibuprofen Other (See Comments)   Heart issue      Medication List    STOP taking these medications   predniSONE 20 MG tablet Commonly known as: DELTASONE     TAKE these medications   acetaminophen 500 MG tablet Commonly known as: TYLENOL Take 1,000 mg by mouth every 6 (six) hours as needed for mild pain.   albuterol 108 (90 Base) MCG/ACT inhaler Commonly known as: VENTOLIN HFA Inhale 2 puffs into the lungs every 4 (four) hours as needed for shortness of breath or wheezing.   aspirin EC 81 MG tablet Take 1 tablet (81 mg total)  by mouth daily.   benzonatate 200 MG capsule Commonly known as: TESSALON Take 1 capsule (200 mg total) by mouth 2 (two) times daily as needed for cough.   blood glucose meter kit and supplies Kit Dispense based on patient and insurance preference. Use up to four times daily as directed. (FOR ICD-9 250.00, 250.01).   carvedilol 3.125 MG tablet Commonly known as: COREG Take 1 tablet by mouth twice daily What changed: when to take this   chlorpheniramine-HYDROcodone 10-8 MG/5ML Suer Commonly known as: TUSSIONEX Take 5 mLs by mouth every 12 (twelve) hours as needed for cough.   metFORMIN 500 MG tablet Commonly known as: Glucophage Take 1 tablet (500 mg total) by mouth 2 (two) times daily with Altheria Shadoan meal. (see discharge instructions for additional recommendations on how to take)   multivitamin with minerals Tabs tablet Take 1 tablet by mouth daily.    nitroGLYCERIN 0.4 MG SL tablet Commonly known as: NITROSTAT DISSOLVE ONE TABLET UNDER THE TONGUE EVERY 5 MINUTES AS NEEDED FOR CHEST PAIN.  DO NOT EXCEED Konner Warrior TOTAL OF 3 DOSES IN 15 MINUTES. Please keep upcoming appt. Thank you What changed:   how much to take  how to take this  when to take this  reasons to take this  additional instructions   omeprazole 20 MG tablet Commonly known as: PRILOSEC OTC Take 20 mg by mouth daily.   pravastatin 40 MG tablet Commonly known as: PRAVACHOL Take 1 tablet by mouth once daily   promethazine 25 MG tablet Commonly known as: PHENERGAN Take 1 tablet (25 mg total) by mouth every 6 (six) hours as needed for nausea or vomiting.            Durable Medical Equipment  (From admission, onward)         Start     Ordered   12/15/20 0737  For home use only DME oxygen  Once       Comments: SATURATION QUALIFICATIONS: (This note is used to comply with regulatory documentation for home oxygen)  Patient Saturations on Room Air at Rest = 83%  Patient Saturations on Room Air while Ambulating = 76%  Patient Saturations on 4 Liters of oxygen while Ambulating = 90%  Please briefly explain why patient needs home oxygen: desaturation at rest and with exertion without oxygen  Question Answer Comment  Length of Need 12 Months   Mode or (Route) Nasal cannula   Liters per Minute 4   Frequency Continuous (stationary and portable oxygen unit needed)   Oxygen conserving device Yes   Oxygen delivery system Gas      12/15/20 0736         Allergies  Allergen Reactions  . Ibuprofen Other (See Comments)    Heart issue    Follow-up Information    [name], MD Follow up.   Specialty: Family Medicine Contact information: [place] [address] [phone]        Sherren Mocha, MD .   Specialty: Cardiology Contact information: [phone] [address] [place] [id number] [phone]        [facility] Follow up.   Specialty: Pulmonology Contact information: [place] [address] [phone]               The results of significant diagnostics from this hospitalization (including imaging, microbiology, ancillary and laboratory) are listed below for reference.    Significant Diagnostic Studies: DG Chest 1  View  Result Date: 12/01/2020 CLINICAL DATA:  COVID positive, cough and shortness of breath. EXAM: CHEST  1 VIEW COMPARISON:  Chest x-rays dated 11/28/2020 and 11/24/2020. FINDINGS: Diffuse bilateral ground-glass airspace opacities, slightly worsened compared to the chest x-ray of 11/28/2020. Heart size and mediastinal contours are stable. No pleural effusion or pneumothorax is seen. IMPRESSION: Bilateral multifocal pneumonia, slightly worsened compared to most recent chest x-ray of 11/28/2020. Electronically Signed   By: Franki Cabot M.D.   On: 12/01/2020 14:36   DG Chest 1 View  Result Date: 11/28/2020 CLINICAL DATA:  Shortness of breath.  COVID. EXAM: CHEST  1 VIEW COMPARISON:  11/24/2020. FINDINGS: Mediastinum hilar structures normal. Heart size normal. Low lung volumes. Diffuse mild bilateral interstitial prominence. No pleural effusion or pneumothorax. No acute bony abnormality. IMPRESSION: Low lung volumes. Diffuse mild bilateral interstitial prominence consistent with pneumonitis in this known COVID positive patient. Electronically Signed   By: Marcello Moores  Register   On: 11/28/2020 15:10   CT ANGIO CHEST PE W OR WO CONTRAST  Result Date: 12/03/2020 CLINICAL DATA:  60 year old male with concern for pulmonary embolism. EXAM: CT ANGIOGRAPHY CHEST WITH CONTRAST TECHNIQUE: Multidetector CT imaging of the chest was performed using the standard protocol during bolus administration of intravenous contrast. Multiplanar CT image reconstructions and MIPs were obtained to evaluate the vascular anatomy. CONTRAST:  156m OMNIPAQUE IOHEXOL  350 MG/ML SOLN COMPARISON:  Chest radiograph dated 12/01/2020. FINDINGS: Cardiovascular: There is no cardiomegaly or pericardial effusion. Coronary vascular calcification of the LAD. The thoracic aorta is unremarkable. Evaluation of the pulmonary arteries is limited due to respiratory motion artifact. No pulmonary artery embolus identified. Mediastinum/Nodes: There is no hilar or mediastinal adenopathy. There is Silver Achey small hiatal hernia. The esophagus is grossly unremarkable. No mediastinal fluid collection. Lungs/Pleura: Bilateral patchy ground-glass opacities consistent with multilobar pneumonia, likely viral or atypical in etiology including COVID-19. Clinical correlation is recommended. No pleural effusion pneumothorax. The central airways are patent. Upper Abdomen: No acute abnormality. Musculoskeletal: No chest wall abnormality. No acute or significant osseous findings. Review of the MIP images confirms the above findings. IMPRESSION: 1. No CT evidence of pulmonary embolism. 2. Multilobar pneumonia, likely viral or atypical in etiology including COVID-19. Clinical correlation is recommended. Electronically Signed   By: AAnner CreteM.D.   On: 12/03/2020 18:56   DG CHEST PORT 1 VIEW  Result Date: 12/15/2020 CLINICAL DATA:  60year old male CCOVID-49  Pneumomediastinum. EXAM: PORTABLE CHEST 1 VIEW COMPARISON:  Portable chest 12/13/2020 and earlier. FINDINGS: Portable AP semi upright view at 0402 hours. Regressed pneumomediastinum and neck soft tissue gas since 12/12/2020. Coarse and patchy bilateral pulmonary opacity not significantly changed. Mildly lower lung volumes. Mediastinal contours remain normal. Visualized tracheal air column is within normal limits. Negative visible bowel gas. Stable visualized osseous structures. IMPRESSION: 1. Mildly lower lung volumes.  Stable COVID-19 pneumonia. 2. Regressed pneumomediastinum and neck soft tissue gas since 12/12/2020. Electronically Signed   By: HGenevie AnnM.D.    On: 12/15/2020 06:04   DG CHEST PORT 1 VIEW  Result Date: 12/13/2020 CLINICAL DATA:  Shortness of breath.  COVID positive. EXAM: PORTABLE CHEST 1 VIEW COMPARISON:  Radiograph earlier today. FINDINGS: Stable lung volumes. Patchy heterogeneous bilateral lung opacities that significant change from prior. Stable heart size and mediastinal contours. No pleural fluid. No pneumothorax. Small amount of subcutaneous emphysema in the right supraclavicular region again seen. No pneumomediastinum is less well appreciated currently. IMPRESSION: 1. Patchy heterogeneous bilateral lung opacities consistent with COVID-19 pneumonia, not significantly  changed from exam earlier this day. 2. Small amount of subcutaneous emphysema in the right supraclavicular region. Pneumomediastinum on prior exam is not as well demonstrated. Electronically Signed   By: Keith Rake M.D.   On: 12/13/2020 19:09   DG CHEST PORT 1 VIEW  Result Date: 12/13/2020 CLINICAL DATA:  COVID-19 positive.  Pneumomediastinum. EXAM: PORTABLE CHEST 1 VIEW COMPARISON:  December 11, 2020 and December 12, 2020 FINDINGS: Pneumomediastinum again noted, perhaps slightly less than on 1 day prior. Subcutaneous air is noted in the right supraclavicular region. No evident pneumothorax. Areas of patchy airspace opacity bilaterally persist without significant change. Heart is upper normal in size with pulmonary vascularity normal. No adenopathy. No bone lesions. IMPRESSION: Pneumomediastinum and subcutaneous air again noted. No pneumothorax evident. Patchy airspace opacity consistent with multifocal pneumonia again noted, stable. No new opacity. Stable cardiac silhouette Electronically Signed   By: Lowella Grip III M.D.   On: 12/13/2020 07:51   DG CHEST PORT 1 VIEW  Result Date: 12/12/2020 CLINICAL DATA:  60 year old male COVID-23.  Pneumomediastinum. EXAM: PORTABLE CHEST 1 VIEW COMPARISON:  Portable chest 12/11/2020 and earlier. FINDINGS: Portable AP semi  upright view at 0517 hours. Evidence of pneumomediastinum and small volume right lower neck subcutaneous gas has not significantly changed since yesterday. Stable lung volumes. Mediastinal contours remain normal. No pneumothorax identified. Patchy and confluent peripheral lung opacity is stable. Negative visible bowel gas, osseous structures. IMPRESSION: Stable COVID-19 pneumonia with pneumomediastinum. No pneumothorax identified. Electronically Signed   By: Genevie Ann M.D.   On: 12/12/2020 07:32   DG CHEST PORT 1 VIEW  Result Date: 12/11/2020 CLINICAL DATA:  Respiratory failure secondary to bilateral COVID-19 pneumonia. EXAM: PORTABLE CHEST 1 VIEW COMPARISON:  CTA of the chest on 12/03/2020, chest x-rays on 12/01/2020, 11/28/2020 and 11/24/2020 FINDINGS: The heart size and mediastinal contours are within normal limits. There is new subcutaneous air in the soft tissues of the right supraclavicular region and base of the right neck as well as new diffuse pneumomediastinum. No definite pneumothorax visualized. No visible pleural effusions. Progressive bilateral pneumonia since prior chest x-rays in [place] similar distribution to the chest CTA. Involvement of the left lung is slightly more dense than the right by x-ray. The visualized skeletal structures are unremarkable. IMPRESSION: 1. New subcutaneous air in the right supraclavicular region and base of the right neck and new diffuse pneumomediastinum. No definite pneumothorax is visualized. 2. Progressive bilateral pneumonia since prior chest x-rays in [place] similar distribution to the recent chest CTA. 3. These results will be called to the ordering clinician or representative by the Radiologist Assistant, and communication documented in the PACS or [employer]. Electronically Signed   By: Aletta Edouard M.D.   On: 12/11/2020 10:02   DG Chest Port 1 View  Result Date: 11/24/2020 CLINICAL DATA:  COVID-19 positive 11/08/2020, weakness, cough, chills EXAM: PORTABLE  CHEST 1 VIEW COMPARISON:  07/08/2012 FINDINGS: The heart size and mediastinal contours are within normal limits. Both lungs are clear. The visualized skeletal structures are unremarkable. IMPRESSION: No active disease. Electronically Signed   By: Randa Ngo M.D.   On: 11/24/2020 18:45   VAS [place] LOWER EXTREMITY VENOUS (DVT)  Result Date: 12/01/2020  Lower Venous DVT Study Indications: Covid+ elevated D-dimer.  Comparison Study: no prior exams Performing Technologist: Rogelia Rohrer  Examination Guidelines: Tattiana Fakhouri complete evaluation includes B-mode imaging, spectral Doppler, color Doppler, and power Doppler as needed of all accessible portions of each vessel. Bilateral testing is considered an integral part  of Britaney Espaillat complete examination. Limited examinations for reoccurring indications may be performed as noted. The reflux portion of the exam is performed with the patient in reverse Trendelenburg.  +---------+---------------+---------+-----------+----------+--------------+ RIGHT    CompressibilityPhasicitySpontaneityPropertiesThrombus Aging +---------+---------------+---------+-----------+----------+--------------+ CFV      Full           Yes      Yes                                 +---------+---------------+---------+-----------+----------+--------------+ SFJ      Full                                                        +---------+---------------+---------+-----------+----------+--------------+ FV Prox  Full           Yes      Yes                                 +---------+---------------+---------+-----------+----------+--------------+ FV Mid   Full           Yes      Yes                                 +---------+---------------+---------+-----------+----------+--------------+ FV DistalFull           Yes      Yes                                 +---------+---------------+---------+-----------+----------+--------------+ PFV      Full                                                         +---------+---------------+---------+-----------+----------+--------------+ POP      Full           Yes      Yes                                 +---------+---------------+---------+-----------+----------+--------------+ PTV      Full                                                        +---------+---------------+---------+-----------+----------+--------------+ PERO     Full                                                        +---------+---------------+---------+-----------+----------+--------------+   +---------+---------------+---------+-----------+----------+--------------+ LEFT     CompressibilityPhasicitySpontaneityPropertiesThrombus Aging +---------+---------------+---------+-----------+----------+--------------+ CFV      Full           Yes      Yes                                 +---------+---------------+---------+-----------+----------+--------------+  SFJ      Full                                                        +---------+---------------+---------+-----------+----------+--------------+ FV Prox  Full           Yes      Yes                                 +---------+---------------+---------+-----------+----------+--------------+ FV Mid   Full           Yes      Yes                                 +---------+---------------+---------+-----------+----------+--------------+ FV DistalFull           Yes      Yes                                 +---------+---------------+---------+-----------+----------+--------------+ PFV      Full                                                        +---------+---------------+---------+-----------+----------+--------------+ POP      Full           Yes      Yes                                 +---------+---------------+---------+-----------+----------+--------------+ PTV      Full                                                         +---------+---------------+---------+-----------+----------+--------------+ PERO     Full                                                        +---------+---------------+---------+-----------+----------+--------------+     Summary: BILATERAL: - No evidence of deep vein thrombosis seen in the lower extremities, bilaterally. -No evidence of popliteal cyst, bilaterally.   *See table(s) above for measurements and observations. Electronically signed by Jamelle Haring on 12/01/2020 at 5:57:25 PM.    Final     Microbiology: No results found for this or any previous visit (from the past 240 hour(s)).   Labs: Basic Metabolic Panel: Recent Labs  Lab 12/11/20 0437 12/12/20 0443 12/13/20 0432 12/14/20 0453 12/15/20 0426  NA 137 137 138 137 139  K 4.1 3.9 4.2 3.7 4.0  CL 103 101 99 100 102  CO2 25 26 31 26 25   GLUCOSE 114* 110* 162* 109* 88  BUN 21* 21* 22* 19 17  CREATININE 0.50* 0.75 1.04 0.75 0.75  CALCIUM 8.5* 8.6* 8.9 8.5* 8.7*  MG 2.2 2.3 2.4 2.3 2.2  PHOS  --  4.3 4.0 3.5 4.1   Liver Function Tests: Recent Labs  Lab 12/11/20 0437 12/12/20 0443 12/13/20 0432 12/14/20 0453 12/15/20 0426  AST 15 16 15  14* 14*  ALT 36 37 33 32 28  ALKPHOS 51 52 59 62 58  BILITOT 0.8 0.9 0.7 0.7 0.6  PROT 5.4* 5.7* 5.9* 5.7* 5.7*  ALBUMIN 2.7* 2.8* 3.0* 2.8* 2.9*   No results for input(s): LIPASE, AMYLASE in the last 168 hours. No results for input(s): AMMONIA in the last 168 hours. CBC: Recent Labs  Lab 12/11/20 0437 12/12/20 0443 12/13/20 0432 12/14/20 0453 12/15/20 0426  WBC 11.6* 11.2* 10.9* 13.0* 11.7*  NEUTROABS  --  7.2 6.7 8.8* 7.4  HGB 13.6 14.3 14.2 14.3 13.3  HCT 42.4 44.9 44.4 43.9 41.1  MCV 85.5 85.0 85.4 84.1 85.3  PLT 345 321 313 285 269   Cardiac Enzymes: No results for input(s): CKTOTAL, CKMB, CKMBINDEX, TROPONINI in the last 168 hours. BNP: BNP (last 3 results) Recent Labs    12/11/20 0437  BNP 27.5    ProBNP (last 3 results) No results for input(s):  PROBNP in the last 8760 hours.  CBG: Recent Labs  Lab 12/14/20 0734 12/14/20 1144 12/14/20 1630 12/14/20 2043 12/15/20 0740  GLUCAP 99 155* 124* 141* 83       Signed:  Fayrene Helper MD.  [facility] 12/15/2020, 10:51 AM

## 2020-12-19 ENCOUNTER — Telehealth: Payer: Self-pay | Admitting: Internal Medicine

## 2020-12-19 NOTE — Telephone Encounter (Addendum)
Per Maryfrances Bunnell, RN, patient can be seen in office 21 days from positive covid test.  Patient tested positive on 11/19/2020. No need to reschedule.

## 2020-12-19 NOTE — Telephone Encounter (Signed)
Patient is scheduled for post covid HFU on 12/20/2020. Patient was d/c on 12/16/2020. OV will need to be rescheduled until 21 days after discharge per office protocol.   Lm for patient.

## 2020-12-20 ENCOUNTER — Ambulatory Visit (INDEPENDENT_AMBULATORY_CARE_PROVIDER_SITE_OTHER): Payer: 59 | Admitting: Internal Medicine

## 2020-12-20 ENCOUNTER — Ambulatory Visit
Admission: RE | Admit: 2020-12-20 | Discharge: 2020-12-20 | Disposition: A | Discharge status: Home / Self Care | Payer: 59 | Source: Ambulatory Visit | Attending: Internal Medicine | Admitting: Internal Medicine

## 2020-12-20 ENCOUNTER — Other Ambulatory Visit: Payer: Self-pay | Admitting: Internal Medicine

## 2020-12-20 ENCOUNTER — Encounter: Payer: Self-pay | Admitting: Internal Medicine

## 2020-12-20 ENCOUNTER — Other Ambulatory Visit: Payer: Self-pay

## 2020-12-20 ENCOUNTER — Other Ambulatory Visit: Payer: Self-pay | Admitting: *Deleted

## 2020-12-20 ENCOUNTER — Other Ambulatory Visit
Admission: RE | Admit: 2020-12-20 | Discharge: 2020-12-20 | Disposition: A | Discharge status: Home / Self Care | Payer: 59 | Source: Ambulatory Visit | Attending: Internal Medicine | Admitting: Internal Medicine

## 2020-12-20 VITALS — BP 124/78 | HR 111 | Temp 97.3°F | Ht 68.0 in | Wt 178.0 lb

## 2020-12-20 DIAGNOSIS — J069 Acute upper respiratory infection, unspecified: Secondary | ICD-10-CM

## 2020-12-20 DIAGNOSIS — R7989 Other specified abnormal findings of blood chemistry: Secondary | ICD-10-CM

## 2020-12-20 DIAGNOSIS — J982 Interstitial emphysema: Secondary | ICD-10-CM

## 2020-12-20 DIAGNOSIS — J9601 Acute respiratory failure with hypoxia: Secondary | ICD-10-CM | POA: Diagnosis not present

## 2020-12-20 DIAGNOSIS — U071 COVID-19: Secondary | ICD-10-CM | POA: Diagnosis not present

## 2020-12-20 DIAGNOSIS — Z8616 Personal history of COVID-19: Secondary | ICD-10-CM | POA: Diagnosis not present

## 2020-12-20 LAB — D-DIMER, QUANTITATIVE: D-Dimer, Quant: 0.56 ug/mL-FEU — ABNORMAL HIGH (ref 0.00–0.50)

## 2020-12-20 LAB — SEDIMENTATION RATE: Sed Rate: 50 mm/hr — ABNORMAL HIGH (ref 0–20)

## 2020-12-20 MED ORDER — PREDNISONE 10 MG PO TABS: ORAL_TABLET | ORAL | 0 refills | Status: DC

## 2020-12-20 NOTE — Patient Instructions (Addendum)
ICD-10-CM   1. Acute respiratory disease due to COVID-19 virus  U07.1    J06.9   2. History of 2019 novel coronavirus disease (COVID-19)  Z86.16   3. Acute respiratory failure with hypoxia (HCC)  J96.01   4. Pneumomediastinum ([place])  J98.2     You are slowly but surely getting better Pulse ox on room air at rest is 88-90%  Plan Check cxr 2 view Check d-dimer, ESR, cbc, bmet  We will call you with the results next week.  Depending on the results he might need a course of steroids. Keep your oxygen level at 88% or above at all times If your chest x-ray does not show any lung function then you could go back to doing incentive spirometry to open up your lungs  Keep yourself as mobile as possible  At some point we could consider home physical therapy or pulmonary rehabilitation  Follow-up -Return to see nurse practitioner in the next 3-4 weeks for continued follow-up

## 2020-12-20 NOTE — Telephone Encounter (Signed)
His ESR is 50.  Chest x-ray shows significant infiltrates.  Official report is pending.  I personally do not see any pneumothorax or pneumomediastinum but will have to await the official chest x-ray report.  Also the D-dimer is tending to go up  Plan -He needs to keep an eye on his legs and his breathing pattern.  Any warmth or swelling in his legs he needs to report to [place] soon  -Get duplex lower extremity for DVT sometime next week  -I think he should do a course of prednisone for 3 weeks as follows   Please take Take prednisone 23m once daily x 4 days, then 370monce daily x 4 days, then 2027mnce daily x 4 days, then prednisone 51m63mce daily  x 4 and then 5 mg once daily for 4 days days and stop

## 2020-12-20 NOTE — Addendum Note (Signed)
Addended by: Lajoyce Lauber A on: 12/20/2020 03:48 PM   Modules accepted: Orders

## 2020-12-20 NOTE — Progress Notes (Signed)
OV 12/20/2020  Subjective:  Patient ID: Curtis Rosales, male , DOB: 07/01/1961 , age 60 y.o. , MRN: [id number] , ADDRESS: [address] [place] [address] PCP [name], MD Patient Care Team: [name], MD as PCP - General (Family Medicine) Sherren Mocha, MD as PCP - Cardiology (Cardiology)  This Provider for this visit: Treatment Team:  Attending Provider: Brand Males, MD    12/20/2020 -   Chief Complaint  Patient presents with  . pulmonary consult    Recent admission- d/c 11/24/2020. On 4L. C/o sob with exertion.    History from patient, wife and also review of the records  HPI Curtis Rosales 60 y.o. -new consultation for post Covid pneumonia hypoxemic respiratory failure.  Is 60 years old.  He presents with his wife.  His wife tells me that I took care of her uncle Curtis Rosales who is now deceased.  Patient tells me that 9 years ago he had ischemic cardiomyopathy.  He has hypertension hyperlipidemia coronary artery disease.  He works as a [employer].  He tested positive for COVID-19 on 11/18/2020.  He was admitted between 11/24/2020 through 12/15/2020 at [place] [facility] for Covid hypoxemic respiratory failure.  He was treated with heated high flow nasal cannula and facemask.  No BiPAP use.  His D-dimer was only positive one time 3+.  His duplex ultrasound was negative based on my chart review.  He did develop pneumomediastinum but it improved.  He still had pulmonary infiltrates at the time of discharge on 12/15/2020.  I personally visualized the chest x-ray.  He has been discharged on 4 L.  Wife tells me that at rest he is using 2/4 L.  When he does minimal exertion such as taking a shower he gets very dyspneic and is cracking his oxygen up to 6 L.  Today here at room air at rest after 5 minutes of being off oxygen his pulse ox was 88 to 90% on room air at rest.  He was not overtly dyspneic.  He is worried about his health.  He has some bruising on his  nostrils from oxygen use.  He denies any brain fog or chest pains.  He is frustrated by his disability right now.   CXR 12/15/20   IMPRESSION: 1. Mildly lower lung volumes.  Stable COVID-19 pneumonia. 2. Regressed pneumomediastinum and neck soft tissue gas since 12/12/2020.   Electronically Signed   By: Genevie Ann M.D.   On: 12/15/2020 06:04     has a past medical history of CAD (coronary artery disease), Cardiac arrest ([place]), Ischemic cardiomyopathy, MI (myocardial infarction) ([place]), and VF (ventricular fibrillation) ([place]).   reports that he has never smoked. He has never used smokeless tobacco.  Past Surgical History:  Procedure Laterality Date  . APPENDECTOMY    . CARDIAC CATHETERIZATION    . LEFT HEART CATHETERIZATION WITH CORONARY ANGIOGRAM N/A 07/08/2012   Procedure: LEFT HEART CATHETERIZATION WITH CORONARY ANGIOGRAM;  Surgeon: Sherren Mocha, MD;  Location: [facility] CATH LAB;  Service: Cardiovascular;  Laterality: N/A;  . PERCUTANEOUS CORONARY STENT INTERVENTION (PCI-S)  07/08/2012   Procedure: PERCUTANEOUS CORONARY STENT INTERVENTION (PCI-S);  Surgeon: Sherren Mocha, MD;  Location: [facility] CATH LAB;  Service: Cardiovascular;;    Allergies  Allergen Reactions  . Ibuprofen Other (See Comments)    Heart issue     There is no immunization history on file for this patient.  Family History  Problem Relation Age of Onset  .  Heart attack Father 4  . Heart attack Sister 29  . Heart attack Other 40     Current Outpatient Medications:  .  acetaminophen (TYLENOL) 500 MG tablet, Take 1,000 mg by mouth every 6 (six) hours as needed for mild pain., Disp: , Rfl:  .  albuterol (VENTOLIN HFA) 108 (90 Base) MCG/ACT inhaler, Inhale 2 puffs into the lungs every 4 (four) hours as needed for shortness of breath or wheezing., Disp: 6.7 g, Rfl: 0 .  aspirin EC 81 MG tablet, Take 1 tablet (81 mg total) by mouth daily., Disp: , Rfl:  .  benzonatate (TESSALON) 200 MG capsule, Take 1 capsule (200  mg total) by mouth 2 (two) times daily as needed for cough., Disp: 30 capsule, Rfl: 0 .  blood glucose meter kit and supplies KIT, Dispense based on patient and insurance preference. Use up to four times daily as directed. (FOR ICD-9 250.00, 250.01)., Disp: 1 each, Rfl: 0 .  carvedilol (COREG) 3.125 MG tablet, Take 1 tablet by mouth twice daily (Patient taking differently: Take 3.125 mg by mouth 2 (two) times daily with a meal.), Disp: 180 tablet, Rfl: 3 .  chlorpheniramine-HYDROcodone (TUSSIONEX) 10-8 MG/5ML SUER, Take 5 mLs by mouth every 12 (twelve) hours as needed for cough., Disp: 70 mL, Rfl: 0 .  metFORMIN (GLUCOPHAGE) 500 MG tablet, Take 1 tablet (500 mg total) by mouth 2 (two) times daily with a meal. (see discharge instructions for additional recommendations on how to take), Disp: 60 tablet, Rfl: 0 .  Multiple Vitamin (MULTIVITAMIN WITH MINERALS) TABS tablet, Take 1 tablet by mouth daily., Disp: , Rfl:  .  nitroGLYCERIN (NITROSTAT) 0.4 MG SL tablet, DISSOLVE ONE TABLET UNDER THE TONGUE EVERY 5 MINUTES AS NEEDED FOR CHEST PAIN.  DO NOT EXCEED A TOTAL OF 3 DOSES IN 15 MINUTES. Please keep upcoming appt. Thank you (Patient taking differently: Place 0.4 mg under the tongue every 5 (five) minutes as needed for chest pain.), Disp: 75 tablet, Rfl: 0 .  omeprazole (PRILOSEC OTC) 20 MG tablet, Take 20 mg by mouth daily., Disp: , Rfl:  .  pravastatin (PRAVACHOL) 40 MG tablet, Take 1 tablet by mouth once daily (Patient taking differently: Take 40 mg by mouth daily.), Disp: 90 tablet, Rfl: 3 .  promethazine (PHENERGAN) 25 MG tablet, Take 1 tablet (25 mg total) by mouth every 6 (six) hours as needed for nausea or vomiting., Disp: 12 tablet, Rfl: 0      Objective:   Vitals:   12/20/20 1108  BP: 124/78  Pulse: (!) 111  Temp: (!) 97.3 F (36.3 C)  TempSrc: Temporal  SpO2: 95%  Weight: 178 lb (80.7 kg)  Height: 5' 8"  (1.727 m)    Estimated body mass index is 27.06 kg/m as calculated from the  following:   Height as of this encounter: 5' 8"  (1.727 m).   Weight as of this encounter: 178 lb (80.7 kg).  @WEIGHTCHANGE @  [employer]   12/20/20 1108  Weight: 178 lb (80.7 kg)     Physical Exam  General: No distress.  Slightly deconditioned male sitting on the wheelchair Neuro: Alert and Oriented x 3. GCS 15. Speech normal Psych: Pleasant Resp:  Barrel Chest - no.  Wheeze - no, Crackles - no, No overt respiratory distress CVS: Normal heart sounds. Murmurs - no Ext: Stigmata of Connective Tissue Disease - no HEENT: Normal upper airway. PEERL +. No post nasal drip.  Some slight bruising on the frenulum.  Assessment:       ICD-10-CM   1. Acute respiratory disease due to COVID-19 virus  U07.1    J06.9   2. History of 2019 novel coronavirus disease (COVID-19)  Z86.16 DG Chest 2 View    Sedimentation rate    D-dimer, quantitative  3. Acute respiratory failure with hypoxia (HCC)  J96.01   4. Pneumomediastinum ([place])  J98.2        Plan:     Patient Instructions     ICD-10-CM   1. Acute respiratory disease due to COVID-19 virus  U07.1    J06.9   2. History of 2019 novel coronavirus disease (COVID-19)  Z86.16   3. Acute respiratory failure with hypoxia (HCC)  J96.01   4. Pneumomediastinum ([place])  J98.2     You are slowly but surely getting better Pulse ox on room air at rest is 88-90%  Plan Check cxr 2 view Check d-dimer, ESR, cbc, bmet  We will call you with the results next week.  Depending on the results he might need a course of steroids. Keep your oxygen level at 88% or above at all times If your chest x-ray does not show any lung function then you could go back to doing incentive spirometry to open up your lungs  Keep yourself as mobile as possible  At some point we could consider home physical therapy or pulmonary rehabilitation  Follow-up -Return to see nurse practitioner in the next 3-4 weeks for continued follow-up       SIGNATURE     Dr. Brand Males, M.D., F.C.C.P,  Pulmonary and Critical Care Medicine Staff Physician, [place] Director - Interstitial Lung Disease  Program  P[facility] [place] [facility] [place], [place], [address]  Pager: [phone], If no answer or between  15:00h - 7:00h: call [phone] Telephone: [phone]  11:43 AM 12/20/2020

## 2020-12-27 ENCOUNTER — Other Ambulatory Visit: Payer: Self-pay

## 2020-12-27 ENCOUNTER — Emergency Department: Payer: 59

## 2020-12-27 ENCOUNTER — Observation Stay
Admission: EM | Admit: 2020-12-27 | Discharge: 2020-12-28 | Disposition: A | Discharge status: Home / Self Care | Payer: 59 | Attending: Internal Medicine | Admitting: Internal Medicine

## 2020-12-27 ENCOUNTER — Encounter: Payer: Self-pay | Admitting: Emergency Medicine

## 2020-12-27 DIAGNOSIS — J9621 Acute and chronic respiratory failure with hypoxia: Secondary | ICD-10-CM | POA: Diagnosis not present

## 2020-12-27 DIAGNOSIS — R6510 Systemic inflammatory response syndrome (SIRS) of non-infectious origin without acute organ dysfunction: Secondary | ICD-10-CM | POA: Diagnosis present

## 2020-12-27 DIAGNOSIS — Z79899 Other long term (current) drug therapy: Secondary | ICD-10-CM | POA: Insufficient documentation

## 2020-12-27 DIAGNOSIS — Z8616 Personal history of COVID-19: Secondary | ICD-10-CM | POA: Diagnosis not present

## 2020-12-27 DIAGNOSIS — E7800 Pure hypercholesterolemia, unspecified: Secondary | ICD-10-CM | POA: Diagnosis present

## 2020-12-27 DIAGNOSIS — E119 Type 2 diabetes mellitus without complications: Secondary | ICD-10-CM | POA: Diagnosis not present

## 2020-12-27 DIAGNOSIS — I2510 Atherosclerotic heart disease of native coronary artery without angina pectoris: Secondary | ICD-10-CM | POA: Diagnosis not present

## 2020-12-27 DIAGNOSIS — I959 Hypotension, unspecified: Secondary | ICD-10-CM | POA: Diagnosis not present

## 2020-12-27 DIAGNOSIS — Z7982 Long term (current) use of aspirin: Secondary | ICD-10-CM | POA: Insufficient documentation

## 2020-12-27 DIAGNOSIS — R0600 Dyspnea, unspecified: Secondary | ICD-10-CM

## 2020-12-27 DIAGNOSIS — Z7984 Long term (current) use of oral hypoglycemic drugs: Secondary | ICD-10-CM | POA: Diagnosis not present

## 2020-12-27 DIAGNOSIS — R0602 Shortness of breath: Secondary | ICD-10-CM | POA: Diagnosis present

## 2020-12-27 DIAGNOSIS — R55 Syncope and collapse: Principal | ICD-10-CM

## 2020-12-27 DIAGNOSIS — I25119 Atherosclerotic heart disease of native coronary artery with unspecified angina pectoris: Secondary | ICD-10-CM | POA: Diagnosis present

## 2020-12-27 LAB — URINALYSIS, ROUTINE W REFLEX MICROSCOPIC
Bilirubin Urine: NEGATIVE
Glucose, UA: NEGATIVE mg/dL
Hgb urine dipstick: NEGATIVE
Ketones, ur: NEGATIVE mg/dL
Leukocytes,Ua: NEGATIVE
Nitrite: NEGATIVE
Protein, ur: NEGATIVE mg/dL
Specific Gravity, Urine: 1.028 (ref 1.005–1.030)
pH: 6 (ref 5.0–8.0)

## 2020-12-27 LAB — COMPREHENSIVE METABOLIC PANEL
ALT: 49 U/L — ABNORMAL HIGH (ref 0–44)
AST: 35 U/L (ref 15–41)
Albumin: 3.5 g/dL (ref 3.5–5.0)
Alkaline Phosphatase: 66 U/L (ref 38–126)
Anion gap: 9 (ref 5–15)
BUN: 18 mg/dL (ref 6–20)
CO2: 27 mmol/L (ref 22–32)
Calcium: 9.5 mg/dL (ref 8.9–10.3)
Chloride: 102 mmol/L (ref 98–111)
Creatinine, Ser: 0.88 mg/dL (ref 0.61–1.24)
GFR, Estimated: >60 mL/min (ref >60)
Glucose, Bld: 169 mg/dL — ABNORMAL HIGH (ref 70–99)
Potassium: 4 mmol/L (ref 3.5–5.1)
Sodium: 138 mmol/L (ref 135–145)
Total Bilirubin: 0.8 mg/dL (ref 0.3–1.2)
Total Protein: 6.9 g/dL (ref 6.5–8.1)

## 2020-12-27 LAB — CBC WITH DIFFERENTIAL/PLATELET
Abs Immature Granulocytes: 0.34 10*3/uL — ABNORMAL HIGH (ref 0.00–0.07)
Basophils Absolute: 0.1 10*3/uL (ref 0.0–0.1)
Basophils Relative: 0 %
Eosinophils Absolute: 0.2 10*3/uL (ref 0.0–0.5)
Eosinophils Relative: 1 %
HCT: 43.7 % (ref 39.0–52.0)
Hemoglobin: 14 g/dL (ref 13.0–17.0)
Immature Granulocytes: 2 %
Lymphocytes Relative: 12 %
Lymphs Abs: 2 10*3/uL (ref 0.7–4.0)
MCH: 26.7 pg (ref 26.0–34.0)
MCHC: 32 g/dL (ref 30.0–36.0)
MCV: 83.4 fL (ref 80.0–100.0)
Monocytes Absolute: 1.3 10*3/uL — ABNORMAL HIGH (ref 0.1–1.0)
Monocytes Relative: 8 %
Neutro Abs: 12 10*3/uL — ABNORMAL HIGH (ref 1.7–7.7)
Neutrophils Relative %: 77 %
Platelets: 341 10*3/uL (ref 150–400)
RBC: 5.24 MIL/uL (ref 4.22–5.81)
RDW: 15.2 % (ref 11.5–15.5)
WBC: 15.9 10*3/uL — ABNORMAL HIGH (ref 4.0–10.5)
nRBC: 0.1 % (ref 0.0–0.2)

## 2020-12-27 LAB — TROPONIN I (HIGH SENSITIVITY)
Troponin I (High Sensitivity): 6 ng/L (ref <18)
Troponin I (High Sensitivity): 4 ng/L (ref <18)

## 2020-12-27 LAB — BRAIN NATRIURETIC PEPTIDE: B Natriuretic Peptide: 67 pg/mL (ref 0.0–100.0)

## 2020-12-27 LAB — CBG MONITORING, ED: Glucose-Capillary: 135 mg/dL — ABNORMAL HIGH (ref 70–99)

## 2020-12-27 LAB — LACTIC ACID, PLASMA: Lactic Acid, Venous: 2.8 mmol/L (ref 0.5–1.9)

## 2020-12-27 LAB — D-DIMER, QUANTITATIVE: D-Dimer, Quant: 0.68 ug/mL-FEU — ABNORMAL HIGH (ref 0.00–0.50)

## 2020-12-27 MED ORDER — SODIUM CHLORIDE 0.9% FLUSH
3.0000 mL | Freq: Two times a day (BID) | INTRAVENOUS | Status: DC
  Administered 2020-12-28: 3 mL via INTRAVENOUS

## 2020-12-27 MED ORDER — IOHEXOL 350 MG/ML SOLN
75.0000 mL | Freq: Once | INTRAVENOUS | Status: AC | PRN
  Administered 2020-12-27: 75 mL via INTRAVENOUS

## 2020-12-27 MED ORDER — PANTOPRAZOLE SODIUM 40 MG PO TBEC
40.0000 mg | DELAYED_RELEASE_TABLET | Freq: Every day | ORAL | Status: DC
  Administered 2020-12-28: 40 mg via ORAL
  Filled 2020-12-27: qty 1

## 2020-12-27 MED ORDER — ACETAMINOPHEN 325 MG PO TABS: 650.0000 mg | ORAL_TABLET | Freq: Four times a day (QID) | ORAL | Status: DC | PRN

## 2020-12-27 MED ORDER — SODIUM CHLORIDE 0.9 % IV BOLUS
1000.0000 mL | Freq: Once | INTRAVENOUS | Status: AC
  Administered 2020-12-27: 1000 mL via INTRAVENOUS

## 2020-12-27 MED ORDER — ASPIRIN EC 81 MG PO TBEC
81.0000 mg | DELAYED_RELEASE_TABLET | Freq: Every day | ORAL | Status: DC
  Administered 2020-12-28: 81 mg via ORAL
  Filled 2020-12-27 (×3): qty 1

## 2020-12-27 MED ORDER — ENOXAPARIN SODIUM 40 MG/0.4ML ~~LOC~~ SOLN
40.0000 mg | SUBCUTANEOUS | Status: DC
  Administered 2020-12-27: 40 mg via SUBCUTANEOUS
  Filled 2020-12-27: qty 0.4

## 2020-12-27 MED ORDER — PRAVASTATIN SODIUM 40 MG PO TABS
40.0000 mg | ORAL_TABLET | Freq: Every day | ORAL | Status: DC
  Administered 2020-12-28: 40 mg via ORAL
  Filled 2020-12-27 (×2): qty 1

## 2020-12-27 MED ORDER — ACETAMINOPHEN 650 MG RE SUPP: 650.0000 mg | Freq: Four times a day (QID) | RECTAL | Status: DC | PRN

## 2020-12-27 MED ORDER — OMEPRAZOLE MAGNESIUM 20 MG PO TBEC: 20.0000 mg | DELAYED_RELEASE_TABLET | Freq: Every day | ORAL | Status: DC

## 2020-12-27 MED ORDER — PANTOPRAZOLE SODIUM 40 MG IV SOLR: 40.0000 mg | Freq: Once | INTRAVENOUS | Status: DC

## 2020-12-27 MED ORDER — ALBUTEROL SULFATE HFA 108 (90 BASE) MCG/ACT IN AERS
2.0000 | INHALATION_SPRAY | RESPIRATORY_TRACT | Status: DC | PRN
  Filled 2020-12-27: qty 6.7

## 2020-12-27 MED ORDER — PREDNISONE 20 MG PO TABS
20.0000 mg | ORAL_TABLET | Freq: Every day | ORAL | Status: DC
  Administered 2020-12-28: 20 mg via ORAL
  Filled 2020-12-27: qty 1

## 2020-12-27 MED ORDER — VANCOMYCIN HCL IN DEXTROSE 1-5 GM/200ML-% IV SOLN
1000.0000 mg | Freq: Once | INTRAVENOUS | Status: AC
  Administered 2020-12-27: 1000 mg via INTRAVENOUS
  Filled 2020-12-27: qty 200

## 2020-12-27 MED ORDER — LACTATED RINGERS IV SOLN: INTRAVENOUS | Status: DC

## 2020-12-27 MED ORDER — INSULIN ASPART 100 UNIT/ML ~~LOC~~ SOLN: 0.0000 [IU] | Freq: Three times a day (TID) | SUBCUTANEOUS | Status: DC

## 2020-12-27 MED ORDER — SODIUM CHLORIDE 0.9 % IV SOLN
2.0000 g | Freq: Once | INTRAVENOUS | Status: AC
  Administered 2020-12-27: 2 g via INTRAVENOUS
  Filled 2020-12-27: qty 2

## 2020-12-27 NOTE — Consult Note (Signed)
PHARMACY -  BRIEF ANTIBIOTIC NOTE   Pharmacy has received consult(s) for vancomycin and cefepime from an ED provider.  The patient's profile has been reviewed for ht/wt/allergies/indication/available labs.    One time order(s) placed by EDP for vancomycin 1g and cefepime 2g  Further antibiotics/pharmacy consults should be ordered by admitting physician if indicated.                       Thank you, Reatha Armour, PharmD Pharmacy Resident  12/27/2020 6:16 PM

## 2020-12-27 NOTE — H&P (Signed)
History and Physical   Curtis Rosales OVF:[id number] DOB: 1961/10/06 DOA: 12/27/2020  PCP: [name], MD   Patient coming from: Home  Chief Complaint: Weakness, shortness of breath  HPI: Curtis Rosales is a 60 y.o. male with medical history significant of CAD status post MI and stents, hyperlipidemia, GERD, recent COVID-19 infection last month on home oxygen who presents with worsening shortness of breath and weakness at home.  As above he did begin to feel weaker and have increasing shortness of breath reportedly desaturating to the 70s on home 4 L and in the 80s on 6 L.  He was instructed to head to the ED for further evaluation and in the ED triage he was noted to be hypotensive to the 84Z to 66A systolic initially with diaphoresis and paleness.  He did improve as below with IV fluids.  Upon further questioning his wife states that he had a similar episode this morning when he desaturated where he seemed like he was about to pass out but never completely passed out.  He states that that since going home he has had some issues with yawning and sneezing where he feels like he is unable to complete the process of being on the like something stops him from completely yawning.  He also states that at sometimes when he is falling it feels like food or liquids may get stuck.  He states he had a sensation of this indigestion or liquid getting stuck as he drank some milk before showing up to the ED.  He also reports some sensation of chills today.  He denies fevers, chest pain, abdominal pain, constipation, diarrhea, nausea, vomiting  ED Course: Vital signs in ED significant for hypotension in triage and then blood pressure in the 63K to 160 systolic now improved to the 109N systolic.  Respirations in the 20s and requiring 2 to 4 L to maintain saturations.  Laboratory work-up showed CMP with glucose 169, ALT 49.  CBC with leukocytosis to 15.9 in the setting of outpatient steroid use.  Noted to have lactic acid  elevation to 2.8.  BMP within normal limits troponin normal with repeat pending.  D-dimer mildly elevated at 0.68.  Respiratory panel not obtained due to recent Covid infection.  Chest x-ray showed stable bilateral opacities consistent with scarring/fibrosis.  CTA chest showed no PE, coarse infiltrates bilaterally consistent with scarring.  Patient started broad-spectrum antibiotics and given IV fluids in the ED with improvement of his symptoms.  Review of Systems: As per HPI otherwise all other systems reviewed and are negative.  Past Medical History:  Diagnosis Date  . CAD (coronary artery disease)    a. 07/2012 Anterolateral MI/Cath: LAD 100 100%-> 3.0x16 Promus Element DES, otw nonobs dzs, EF 30%  . Cardiac arrest ([place])   . Ischemic cardiomyopathy    a. 07/2012 Echo: EF 25-30% w/ mid ant, basal inf, sept Sev HK & apical, dist post, mid/dist inf, dist ant, dist lat AK. Gr 1 DD, mild MR.  . MI (myocardial infarction) ([place])   . VF (ventricular fibrillation) ([place])    a. in setting of MI 07/2012    Past Surgical History:  Procedure Laterality Date  . APPENDECTOMY    . CARDIAC CATHETERIZATION    . LEFT HEART CATHETERIZATION WITH CORONARY ANGIOGRAM N/A 07/08/2012   Procedure: LEFT HEART CATHETERIZATION WITH CORONARY ANGIOGRAM;  Surgeon: Sherren Mocha, MD;  Location: [facility] CATH LAB;  Service: Cardiovascular;  Laterality: N/A;  . PERCUTANEOUS CORONARY STENT INTERVENTION (PCI-S)  07/08/2012  Procedure: PERCUTANEOUS CORONARY STENT INTERVENTION (PCI-S);  Surgeon: Sherren Mocha, MD;  Location: [facility] CATH LAB;  Service: Cardiovascular;;    Social History  reports that he has never smoked. He has never used smokeless tobacco. He reports that he does not drink alcohol and does not use drugs.  Allergies  Allergen Reactions  . Ibuprofen Other (See Comments)    Heart issue    Family History  Problem Relation Age of Onset  . Heart attack Father 65  . Heart attack Sister 85  . Heart attack Other 40   Reviewed on admission  Prior to Admission medications   Medication Sig Start Date End Date Taking? Authorizing Provider  acetaminophen (TYLENOL) 500 MG tablet Take 1,000 mg by mouth every 6 (six) hours as needed for mild pain.    [provider]  albuterol (VENTOLIN HFA) 108 (90 Base) MCG/ACT inhaler Inhale 2 puffs into the lungs every 4 (four) hours as needed for shortness of breath or wheezing. 12/15/20 01/14/21  Elodia Florence., MD  aspirin EC 81 MG tablet Take 1 tablet (81 mg total) by mouth daily. 07/20/13   Sherren Mocha, MD  benzonatate (TESSALON) 200 MG capsule Take 1 capsule (200 mg total) by mouth 2 (two) times daily as needed for cough. 11/18/20   Rodriguez-Southworth, Sunday Spillers, PA-C  blood glucose meter kit and supplies KIT Dispense based on patient and insurance preference. Use up to four times daily as directed. (FOR ICD-9 250.00, 250.01). 12/15/20   Elodia Florence., MD  carvedilol (COREG) 3.125 MG tablet Take 1 tablet by mouth twice daily Patient taking differently: Take 3.125 mg by mouth 2 (two) times daily with a meal. 03/22/20   Sherren Mocha, MD  chlorpheniramine-HYDROcodone (TUSSIONEX) 10-8 MG/5ML SUER Take 5 mLs by mouth every 12 (twelve) hours as needed for cough. 12/15/20   Elodia Florence., MD  metFORMIN (GLUCOPHAGE) 500 MG tablet Take 1 tablet (500 mg total) by mouth 2 (two) times daily with a meal. (see discharge instructions for additional recommendations on how to take) 12/15/20 01/14/21  Elodia Florence., MD  Multiple Vitamin (MULTIVITAMIN WITH MINERALS) TABS tablet Take 1 tablet by mouth daily.    [provider]  nitroGLYCERIN (NITROSTAT) 0.4 MG SL tablet DISSOLVE ONE TABLET UNDER THE TONGUE EVERY 5 MINUTES AS NEEDED FOR CHEST PAIN.  DO NOT EXCEED A TOTAL OF 3 DOSES IN 15 MINUTES. Please keep upcoming appt. Thank you Patient taking differently: Place 0.4 mg under the tongue every 5 (five) minutes as needed for chest pain. 02/15/20    Sherren Mocha, MD  omeprazole (PRILOSEC OTC) 20 MG tablet Take 20 mg by mouth daily.    [provider]  pravastatin (PRAVACHOL) 40 MG tablet Take 1 tablet by mouth once daily Patient taking differently: Take 40 mg by mouth daily. 03/22/20   Sherren Mocha, MD  predniSONE (DELTASONE) 10 MG tablet Take 4 tablets (40 mg total) by mouth daily with breakfast for 4 days, THEN 3 tablets (30 mg total) daily with breakfast for 4 days, THEN 2 tablets (20 mg total) daily with breakfast for 4 days, THEN 1 tablet (10 mg total) daily with breakfast for 4 days. Then stop.. 12/20/20 01/05/21  Brand Males, MD  promethazine (PHENERGAN) 25 MG tablet Take 1 tablet (25 mg total) by mouth every 6 (six) hours as needed for nausea or vomiting. 01/24/18   Noemi Chapel, MD  fenofibrate 160 MG tablet Take 1 tablet (160 mg total) by mouth daily.  02/28/20 11/18/20  Sherren Mocha, MD    Physical Exam: Vitals:   12/27/20 1700 12/27/20 1730 12/27/20 1800 12/27/20 1830  BP: 101/71 103/69 111/69 113/68  Pulse: 89 80 81 81  Resp: (!) 24 18 (!) 22 20  Temp:      TempSrc:      SpO2: 97% 96% 98% 97%  Weight:      Height:       Physical Exam Constitutional:      General: He is not in acute distress.    Appearance: Normal appearance.  HENT:     Head: Normocephalic and atraumatic.     Mouth/Throat:     Mouth: Mucous membranes are moist.     Pharynx: Oropharynx is clear.  Eyes:     Extraocular Movements: Extraocular movements intact.     Pupils: Pupils are equal, round, and reactive to light.  Cardiovascular:     Rate and Rhythm: Normal rate and regular rhythm.     Pulses: Normal pulses.     Heart sounds: Normal heart sounds.  Pulmonary:     Effort: Pulmonary effort is normal. No respiratory distress.     Breath sounds: Normal breath sounds.  Abdominal:     General: Bowel sounds are normal. There is no distension.     Palpations: Abdomen is soft.     Tenderness: There is no abdominal tenderness.   Musculoskeletal:        General: No swelling or deformity.  Skin:    General: Skin is warm and dry.  Neurological:     General: No focal deficit present.     Mental Status: Mental status is at baseline.    Labs on Admission: I have personally reviewed following labs and imaging studies  CBC: Recent Labs  Lab 12/27/20 1535  WBC 15.9*  NEUTROABS 12.0*  HGB 14.0  HCT 43.7  MCV 83.4  PLT 983    Basic Metabolic Panel: Recent Labs  Lab 12/27/20 1535  NA 138  K 4.0  CL 102  CO2 27  GLUCOSE 169*  BUN 18  CREATININE 0.88  CALCIUM 9.5    GFR: Estimated Creatinine Clearance: 91.8 mL/min (by C-G formula based on SCr of 0.88 mg/dL).  Liver Function Tests: Recent Labs  Lab 12/27/20 1535  AST 35  ALT 49*  ALKPHOS 66  BILITOT 0.8  PROT 6.9  ALBUMIN 3.5    Urine analysis: No results found for: COLORURINE, APPEARANCEUR, LABSPEC, PHURINE, GLUCOSEU, HGBUR, BILIRUBINUR, KETONESUR, PROTEINUR, UROBILINOGEN, NITRITE, LEUKOCYTESUR  Radiological Exams on Admission: DG Chest 2 View  Result Date: 12/27/2020 CLINICAL DATA:  Shortness of breath. Recent COVID pneumonia. Previous myocardial infarcts. EXAM: CHEST - 2 VIEW COMPARISON:  12/20/2020 FINDINGS: The heart size and mediastinal contours are within normal limits. Coarse pulmonary opacity throughout both lungs is unchanged, and consistent with scarring and fibrosis. No new or superimposed areas of pulmonary infiltrate are seen. No evidence of pleural effusion. IMPRESSION: Stable coarse bilateral pulmonary opacities, consistent with scarring and fibrosis. No acute findings. Electronically Signed   By: Marlaine Hind M.D.   On: 12/27/2020 16:15   CT Angio Chest PE W and/or Wo Contrast  Result Date: 12/27/2020 CLINICAL DATA:  Shortness of breath 6 EXAM: CT ANGIOGRAPHY CHEST WITH CONTRAST TECHNIQUE: Multidetector CT imaging of the chest was performed using the standard protocol during bolus administration of intravenous contrast.  Multiplanar CT image reconstructions and MIPs were obtained to evaluate the vascular anatomy. CONTRAST:  60m OMNIPAQUE IOHEXOL 350 MG/ML SOLN COMPARISON:  Chest x-ray 12/27/2020, CT 12/03/2020 FINDINGS: Cardiovascular: Satisfactory opacification of the pulmonary arteries to the segmental level. No evidence of pulmonary embolism. Normal heart size. No pericardial effusion. Nonaneurysmal aorta. No dissection is seen. Mild aortic atherosclerosis. Coronary vascular calcification. No significant pericardial effusion Mediastinum/Nodes: Midline trachea. No thyroid mass. Subcentimeter mediastinal and hilar nodes. Esophagus within normal limits. Lungs/Pleura: No pneumothorax or pleural effusion. Widespread coarse bandlike densities with mild associated ground-glass density felt most suggestive of post infectious/post inflammatory scarring. Upper Abdomen: No acute abnormality. Musculoskeletal: No chest wall abnormality. No acute or significant osseous findings. Review of the MIP images confirms the above findings. IMPRESSION: 1. Negative for acute pulmonary embolus or aortic dissection. 2. Widespread coarse bandlike densities within the bilateral lungs with mild associated ground-glass density, felt most suggestive of post infectious/post inflammatory scarring. 3. Aortic atherosclerosis. Aortic Atherosclerosis (ICD10-I70.0). Electronically Signed   By: Donavan Foil M.D.   On: 12/27/2020 18:04   EKG: Independently reviewed.  Sinus rhythm at 77 bpm.  Assessment/Plan Principal Problem:   SIRS (systemic inflammatory response syndrome) (HCC) Active Problems:   CAD (coronary artery disease)   Pure hypercholesterolemia   Acute on chronic respiratory failure with hypoxia (HCC)   Hypotension  SIRS Hypotension Presyncope Acute on chronic hypoxic respiratory failure > Patient presents following weakness and worsening shortness of breath at home on home oxygen after recent admission for COVID-19 pneumonia. > Noted to  have increased oxygen demands of 4 to 6 L at home saturating less than 90% despite this. > In triage noted to be significantly hypotensive to the 34K to 87G systolic per reports.  Blood pressure has since improved to the 81L to 572I systolic following 1 L of IV fluid.  There are no evidence of AKI.  Now suspect syncopal event. > Initially started on treatment for possible sepsis given soft blood pressures and leukocytosis.  However no source has been elucidated thus far.  Unlikely that he would have severe symptoms with no new appearance on CT. > Possible that his soft blood pressure represents syncopal vagal /reflex syncope event.  Do not suspect arrhythmia given prodrome. He does have a history of periinfarct V. fib but none since that time he also was reportedly sweaty and pale during the event which would be more consistent with a vagal event. > Could be a component of long COVID considering his significant fibrosis and scarring on imaging leading to syncope versus something related to his difficulty swallowing and sensation of something getting stuck in his lower esophagus. - We will monitor in progressive - Has received Vanco and cefepime in ED will hold off on further antibiotics for now and monitor him overnight, no infectious source identified and leukocytosis is in the setting of outpatient steroids - Follow-up results of urine studies and blood cultures - Continue telemetry - Check echo - Trend renal function electrolytes - Monitor oxygen demands which appear to return to baseline - Trend lactic acid, if remains elevated may be related to Metformin  CAD > Status post MI in 2013 and stenting > BNP and troponin initially normal in ED - We will continue to trend out repeat troponin - Hold Coreg tonight  Diabetes - Hold Metformin - SSI  GERD - Continue PPI  DVT prophylaxis: Lovenox full  Code Status:   Full Family Communication:  Significant other updated bedside Disposition  Plan:   Patient is from:  Home  Anticipated DC to:  Home  Anticipated DC date:  1 to 3 days  Anticipated  DC barriers: None  Consults called:  None  Admission status:  Observation, progressive   Severity of Illness: The appropriate patient status for this patient is OBSERVATION. Observation status is judged to be reasonable and necessary in order to provide the required intensity of service to ensure the patient's safety. The patient's presenting symptoms, physical exam findings, and initial radiographic and laboratory data in the context of their medical condition is felt to place them at decreased risk for further clinical deterioration. Furthermore, it is anticipated that the patient will be medically stable for discharge from the hospital within 2 midnights of admission. The following factors support the patient status of observation.   " The patient's presenting symptoms include shortness of breath, weakness, diaphoresis and chills. " The physical exam findings include stable exam findings some coarse breath sounds. " The initial radiographic and laboratory data are concerning for soft blood pressures in the ED, leukocytosis of 15.9, lactic acid 2.8.  Marcelyn Bruins MD [facility]  How to contact the [facility] Attending or Consulting provider [place] or covering provider during after hours [place], for this patient?   1. Check the care team in [facility] and look for a) attending/consulting [facility] provider listed and b) the [facility] team listed 2. Log into [email or web address] and use [place]'s universal password to access. If you do not have the password, please contact the hospital operator. 3. Locate the [facility] provider you are looking for under [facility] and page to a number that you can be directly reached. 4. If you still have difficulty reaching the provider, please page the [facility] (Director on Call) for the Hospitalists listed on amion for assistance.  12/27/2020, 6:58 PM

## 2020-12-27 NOTE — ED Provider Notes (Signed)
[facility] Emergency Department Provider Note    Event Date/Time   First MD Initiated Contact with Patient 12/27/20 1553     (approximate)  I have reviewed the triage vital signs and the nursing notes.   HISTORY  Chief Complaint Shortness of Breath    HPI Curtis Rosales is a 60 y.o. male below listed past medical history presents to the ER for evaluation of shortness of breath and weakness.  Had episode today became very weak very short of breath while taking a shower.  They have been learning to titrate the oxygen at home the patient folic is about to pass out.  Was brought to the ER found to be hypoxic to 86% on 2 L also found to be hypotensive.  He denies any diarrhea no vomiting.  States his appetite has been good.  Did take his blood pressure medication this morning.    Past Medical History:  Diagnosis Date  . CAD (coronary artery disease)    a. 07/2012 Anterolateral MI/Cath: LAD 100 100%-> 3.0x16 Promus Element DES, otw nonobs dzs, EF 30%  . Cardiac arrest ([place])   . Ischemic cardiomyopathy    a. 07/2012 Echo: EF 25-30% w/ mid ant, basal inf, sept Sev HK & apical, dist post, mid/dist inf, dist ant, dist lat AK. Gr 1 DD, mild MR.  . MI (myocardial infarction) ([place])   . VF (ventricular fibrillation) ([place])    a. in setting of MI 07/2012   Family History  Problem Relation Age of Onset  . Heart attack Father 1  . Heart attack Sister 42  . Heart attack Other 40   Past Surgical History:  Procedure Laterality Date  . APPENDECTOMY    . CARDIAC CATHETERIZATION    . LEFT HEART CATHETERIZATION WITH CORONARY ANGIOGRAM N/A 07/08/2012   Procedure: LEFT HEART CATHETERIZATION WITH CORONARY ANGIOGRAM;  Surgeon: Sherren Mocha, MD;  Location: [facility] CATH LAB;  Service: Cardiovascular;  Laterality: N/A;  . PERCUTANEOUS CORONARY STENT INTERVENTION (PCI-S)  07/08/2012   Procedure: PERCUTANEOUS CORONARY STENT INTERVENTION (PCI-S);  Surgeon: Sherren Mocha, MD;  Location: [facility] CATH  LAB;  Service: Cardiovascular;;   Patient Active Problem List   Diagnosis Date Noted  . SIRS (systemic inflammatory response syndrome) ([place]) 12/27/2020  . Hypotension 12/27/2020  . Near syncope 12/27/2020  . Sepsis due to pneumonia ([place]) 11/25/2020  . Pneumonia due to COVID-19 virus 11/25/2020  . Acute respiratory disease due to COVID-19 virus 11/24/2020  . Acute on chronic respiratory failure with hypoxia ([place]) 11/24/2020  . Hypertriglyceridemia 02/25/2015  . Pure hypercholesterolemia 09/14/2014  . Acute MI, anterolateral wall, initial episode of care ([place]) 07/12/2012  . Cardiomyopathy, ischemic 07/12/2012  . CAD (coronary artery disease) 07/12/2012  . Ventricular fibrillation ([place]) 07/08/2012      Prior to Admission medications   Medication Sig Start Date End Date Taking? Authorizing Provider  acetaminophen (TYLENOL) 500 MG tablet Take 1,000 mg by mouth every 6 (six) hours as needed for mild pain.   Yes [provider]  albuterol (VENTOLIN HFA) 108 (90 Base) MCG/ACT inhaler Inhale 2 puffs into the lungs every 4 (four) hours as needed for shortness of breath or wheezing. 12/15/20 01/14/21 Yes Elodia Florence., MD  aspirin EC 81 MG tablet Take 1 tablet (81 mg total) by mouth daily. 07/20/13  Yes Sherren Mocha, MD  benzonatate (TESSALON) 200 MG capsule Take 1 capsule (200 mg total) by mouth 2 (two) times daily as needed for cough. 11/18/20  Yes Rodriguez-Southworth,  Sunday Spillers, PA-C  carvedilol (COREG) 3.125 MG tablet Take 1 tablet by mouth twice daily Patient taking differently: Take 3.125 mg by mouth 2 (two) times daily with a meal. 03/22/20  Yes Sherren Mocha, MD  Multiple Vitamin (MULTIVITAMIN WITH MINERALS) TABS tablet Take 1 tablet by mouth daily.   Yes [provider]  omeprazole (PRILOSEC OTC) 20 MG tablet Take 20 mg by mouth daily.   Yes [provider]  pravastatin (PRAVACHOL) 40 MG tablet Take 1 tablet by mouth once daily Patient taking  differently: Take 40 mg by mouth daily. 03/22/20  Yes Sherren Mocha, MD  promethazine (PHENERGAN) 25 MG tablet Take 1 tablet (25 mg total) by mouth every 6 (six) hours as needed for nausea or vomiting. 01/24/18  Yes Noemi Chapel, MD  blood glucose meter kit and supplies KIT Dispense based on patient and insurance preference. Use up to four times daily as directed. (FOR ICD-9 250.00, 250.01). 12/15/20   Elodia Florence., MD  chlorpheniramine-HYDROcodone (TUSSIONEX) 10-8 MG/5ML SUER Take 5 mLs by mouth every 12 (twelve) hours as needed for cough. 12/15/20   Elodia Florence., MD  metFORMIN (GLUCOPHAGE) 500 MG tablet Take 1 tablet (500 mg total) by mouth 2 (two) times daily with a meal. (see discharge instructions for additional recommendations on how to take) Patient not taking: No sig reported 12/15/20 01/14/21  Elodia Florence., MD  nitroGLYCERIN (NITROSTAT) 0.4 MG SL tablet DISSOLVE ONE TABLET UNDER THE TONGUE EVERY 5 MINUTES AS NEEDED FOR CHEST PAIN.  DO NOT EXCEED A TOTAL OF 3 DOSES IN 15 MINUTES. Please keep upcoming appt. Thank you Patient taking differently: Place 0.4 mg under the tongue every 5 (five) minutes as needed for chest pain. 02/15/20   Sherren Mocha, MD  predniSONE (DELTASONE) 10 MG tablet Take 4 tablets (40 mg total) by mouth daily with breakfast for 4 days, THEN 3 tablets (30 mg total) daily with breakfast for 4 days, THEN 2 tablets (20 mg total) daily with breakfast for 4 days, THEN 1 tablet (10 mg total) daily with breakfast for 4 days. Then stop.. 12/20/20 01/05/21  Brand Males, MD  fenofibrate 160 MG tablet Take 1 tablet (160 mg total) by mouth daily. 02/28/20 11/18/20  Sherren Mocha, MD    Allergies Ibuprofen    Social History Social History   Tobacco Use  . Smoking status: Never Smoker  . Smokeless tobacco: Never Used  Vaping Use  . Vaping Use: Never used  Substance Use Topics  . Alcohol use: No  . Drug use: No    Review of Systems Patient  denies headaches, rhinorrhea, blurry vision, numbness, shortness of breath, chest pain, edema, cough, abdominal pain, nausea, vomiting, diarrhea, dysuria, fevers, rashes or hallucinations unless otherwise stated above in HPI. ____________________________________________   PHYSICAL EXAM:  VITAL SIGNS: Vitals:   12/27/20 1931 12/27/20 2000  BP: 114/70 110/73  Pulse: 84 85  Resp: 20 20  Temp:    SpO2: 95% 96%    Constitutional: Alert and oriented. Frail appearing Eyes: Conjunctivae are normal.  Head: Atraumatic. Nose: No congestion/rhinnorhea. Mouth/Throat: Mucous membranes are moist.   Neck: No stridor. Painless ROM.  Cardiovascular: Normal rate, regular rhythm. Grossly normal heart sounds.  Good peripheral circulation. Respiratory: Normal respiratory effort.  No retractions. Lungs with coarse bs bilaterally Gastrointestinal: Soft and nontender. No distention. No abdominal bruits. No CVA tenderness. Genitourinary:  Musculoskeletal: No lower extremity tenderness nor edema.  No joint effusions. Neurologic:  Normal speech and language. No  gross focal neurologic deficits are appreciated. No facial droop Skin:  Skin is warm, dry and intact. No rash noted. Psychiatric: Mood and affect are normal. Speech and behavior are normal.  ____________________________________________   LABS (all labs ordered are listed, but only abnormal results are displayed)  Results for orders placed or performed during the hospital encounter of 12/27/20 (from the past 24 hour(s))  CBC with Differential     Status: Abnormal   Collection Time: 12/27/20  3:35 PM  Result Value Ref Range   WBC 15.9 (H) 4.0 - 10.5 K/uL   RBC 5.24 4.22 - 5.81 MIL/uL   Hemoglobin 14.0 13.0 - 17.0 g/dL   HCT 43.7 39.0 - 52.0 %   MCV 83.4 80.0 - 100.0 fL   MCH 26.7 26.0 - 34.0 pg   MCHC 32.0 30.0 - 36.0 g/dL   RDW 15.2 11.5 - 15.5 %   Platelets 341 150 - 400 K/uL   nRBC 0.1 0.0 - 0.2 %   Neutrophils Relative % 77 %   Neutro  Abs 12.0 (H) 1.7 - 7.7 K/uL   Lymphocytes Relative 12 %   Lymphs Abs 2.0 0.7 - 4.0 K/uL   Monocytes Relative 8 %   Monocytes Absolute 1.3 (H) 0.1 - 1.0 K/uL   Eosinophils Relative 1 %   Eosinophils Absolute 0.2 0.0 - 0.5 K/uL   Basophils Relative 0 %   Basophils Absolute 0.1 0.0 - 0.1 K/uL   Immature Granulocytes 2 %   Abs Immature Granulocytes 0.34 (H) 0.00 - 0.07 K/uL  Comprehensive metabolic panel     Status: Abnormal   Collection Time: 12/27/20  3:35 PM  Result Value Ref Range   Sodium 138 135 - 145 mmol/L   Potassium 4.0 3.5 - 5.1 mmol/L   Chloride 102 98 - 111 mmol/L   CO2 27 22 - 32 mmol/L   Glucose, Bld 169 (H) 70 - 99 mg/dL   BUN 18 6 - 20 mg/dL   Creatinine, Ser 0.88 0.61 - 1.24 mg/dL   Calcium 9.5 8.9 - 10.3 mg/dL   Total Protein 6.9 6.5 - 8.1 g/dL   Albumin 3.5 3.5 - 5.0 g/dL   AST 35 15 - 41 U/L   ALT 49 (H) 0 - 44 U/L   Alkaline Phosphatase 66 38 - 126 U/L   Total Bilirubin 0.8 0.3 - 1.2 mg/dL   GFR, Estimated >60 >60 mL/min   Anion gap 9 5 - 15  Troponin I (High Sensitivity)     Status: None   Collection Time: 12/27/20  3:35 PM  Result Value Ref Range   Troponin I (High Sensitivity) 6 <18 ng/L  Brain natriuretic peptide     Status: None   Collection Time: 12/27/20  3:35 PM  Result Value Ref Range   B Natriuretic Peptide 67.0 0.0 - 100.0 pg/mL  D-dimer, quantitative     Status: Abnormal   Collection Time: 12/27/20  3:35 PM  Result Value Ref Range   D-Dimer, Quant 0.68 (H) 0.00 - 0.50 ug/mL-FEU  Lactic acid, plasma     Status: Abnormal   Collection Time: 12/27/20  4:51 PM  Result Value Ref Range   Lactic Acid, Venous 2.8 (HH) 0.5 - 1.9 mmol/L  Troponin I (High Sensitivity)     Status: None   Collection Time: 12/27/20  6:37 PM  Result Value Ref Range   Troponin I (High Sensitivity) 4 <18 ng/L  Urinalysis, Routine w reflex microscopic Urine, Clean Catch  Status: Abnormal   Collection Time: 12/27/20  8:34 PM  Result Value Ref Range   Color, Urine  YELLOW (A) YELLOW   APPearance HAZY (A) CLEAR   Specific Gravity, Urine 1.028 1.005 - 1.030   pH 6.0 5.0 - 8.0   Glucose, UA NEGATIVE NEGATIVE mg/dL   Hgb urine dipstick NEGATIVE NEGATIVE   Bilirubin Urine NEGATIVE NEGATIVE   Ketones, ur NEGATIVE NEGATIVE mg/dL   Protein, ur NEGATIVE NEGATIVE mg/dL   Nitrite NEGATIVE NEGATIVE   Leukocytes,Ua NEGATIVE NEGATIVE   ____________________________________________  EKG My review and personal interpretation at Time: 16:01   Indication: sob  Rate: 75  Rhythm: sinus Axis: normal Other: normal intervals, no stemi ____________________________________________  RADIOLOGY  I personally reviewed all radiographic images ordered to evaluate for the above acute complaints and reviewed radiology reports and findings.  These findings were personally discussed with the patient.  Please see medical record for radiology report.  ____________________________________________   PROCEDURES  Procedure(s) performed:  Procedures    Critical Care performed: no ____________________________________________   INITIAL IMPRESSION / ASSESSMENT AND PLAN / ED COURSE  Pertinent labs & imaging results that were available during my care of the patient were reviewed by me and considered in my medical decision making (see chart for details).   DDX: PE, pneumonia, dysrhythmia, ACS, CHF, vasovagal, sepsis  Curtis Rosales is a 61 y.o. who presents to the ED with presentation as described above.  Patient arrives ill-appearing hypotensive moderate respiratory distress requiring supplemental oxygen.  Presentation complicated by his recent Covid illness.  Not febrile but septic work-up initiated will give IV fluids.  May be secondary to vasovagal or dehydration.  Is not complain of any chest pain right now initial troponin was negative.  Will order CTA to exclude PE.  Clinical Course as of 12/27/20 2100  Fri Dec 27, 2020  1815 Patient reassessed.  CTA does not show any evidence  of PE.  He feels improved but given his elevated lactate white blood cell count stating that he was feeling chills over the past day or 2 do feel be prudent to cover him with antibiotics.  Has received IV fluids.  Given his hypotension and hypoxia will discuss with hospitalist for admission for further observation medical treatment.  Patient agreeable to plan. [PR]    Clinical Course User Index [PR] Merlyn Lot, MD    The patient was evaluated in Emergency Department today for the symptoms described in the history of present illness. He/she was evaluated in the context of the global COVID-19 pandemic, which necessitated consideration that the patient might be at risk for infection with the SARS-CoV-2 virus that causes COVID-19. Institutional protocols and algorithms that pertain to the evaluation of patients at risk for COVID-19 are in a state of rapid change based on information released by regulatory bodies including the CDC and federal and state organizations. These policies and algorithms were followed during the patient's care in the ED.  As part of my medical decision making, I reviewed the following data within the [facility] notes reviewed and incorporated, Labs reviewed, notes from prior ED visits and [place] Controlled Substance Database   ____________________________________________   FINAL CLINICAL IMPRESSION(S) / ED DIAGNOSES  Final diagnoses:  SIRS (systemic inflammatory response syndrome) (HCC)  Dyspnea, unspecified type      NEW MEDICATIONS STARTED DURING THIS VISIT:  New Prescriptions   No medications on file     Note:  This document was prepared using Dragon voice recognition  software and may include unintentional dictation errors.    Merlyn Lot, MD 12/27/20 2100

## 2020-12-27 NOTE — ED Notes (Signed)
Critical Lactic 2.8. Roxan Hockey MD notified.

## 2020-12-27 NOTE — Telephone Encounter (Signed)
This has been discussed with Rubye Oaks, NP. She recommended ED for further evaluation and imaging.   I have spoken to patient's wife via telephone. Misty Stanley stated that patient has a rough day today. Patient is very weak and his oxygen as dropped as low as 74% on 4L. spo2 recovered to 86% on 6L after of rest.  Misty Stanley is aware of recommendations. She voiced her understanding and had no further questions.

## 2020-12-27 NOTE — ED Notes (Signed)
Burna Mortimer, RN reports pt's room is not clean at this time - will let ED know when room is ready.

## 2020-12-27 NOTE — ED Triage Notes (Signed)
Pt here with c/o shob that has been ongoing for a month now, worsening over the past few days. Was discharged 2 weeks ago from [facility], there for a month with covid. sats in triage 86 on 2L, is on home o2 as well. Increased to 4L sats 89%. BP 56/40, 63/50 in triage. Pt pale, alert and oriented x4.

## 2020-12-27 NOTE — ED Triage Notes (Signed)
Diaphoretic in triage, pt states, "I feel very weak, feel like I just about went out on you."

## 2020-12-27 NOTE — ED Notes (Signed)
Floor ready for pt at this time, RN transporting pt to floor.

## 2020-12-28 DIAGNOSIS — J9621 Acute and chronic respiratory failure with hypoxia: Secondary | ICD-10-CM | POA: Diagnosis not present

## 2020-12-28 DIAGNOSIS — R6510 Systemic inflammatory response syndrome (SIRS) of non-infectious origin without acute organ dysfunction: Secondary | ICD-10-CM | POA: Diagnosis not present

## 2020-12-28 DIAGNOSIS — I959 Hypotension, unspecified: Secondary | ICD-10-CM | POA: Diagnosis not present

## 2020-12-28 DIAGNOSIS — R55 Syncope and collapse: Secondary | ICD-10-CM | POA: Diagnosis not present

## 2020-12-28 LAB — COMPREHENSIVE METABOLIC PANEL
ALT: 41 U/L (ref 0–44)
AST: 21 U/L (ref 15–41)
Albumin: 2.8 g/dL — ABNORMAL LOW (ref 3.5–5.0)
Alkaline Phosphatase: 59 U/L (ref 38–126)
Anion gap: 8 (ref 5–15)
BUN: 16 mg/dL (ref 6–20)
CO2: 26 mmol/L (ref 22–32)
Calcium: 9 mg/dL (ref 8.9–10.3)
Chloride: 107 mmol/L (ref 98–111)
Creatinine, Ser: 0.79 mg/dL (ref 0.61–1.24)
GFR, Estimated: >60 mL/min (ref >60)
Glucose, Bld: 128 mg/dL — ABNORMAL HIGH (ref 70–99)
Potassium: 3.8 mmol/L (ref 3.5–5.1)
Sodium: 141 mmol/L (ref 135–145)
Total Bilirubin: 0.5 mg/dL (ref 0.3–1.2)
Total Protein: 5.7 g/dL — ABNORMAL LOW (ref 6.5–8.1)

## 2020-12-28 LAB — CBC
HCT: 36 % — ABNORMAL LOW (ref 39.0–52.0)
Hemoglobin: 12.1 g/dL — ABNORMAL LOW (ref 13.0–17.0)
MCH: 27.8 pg (ref 26.0–34.0)
MCHC: 33.6 g/dL (ref 30.0–36.0)
MCV: 82.8 fL (ref 80.0–100.0)
Platelets: 261 10*3/uL (ref 150–400)
RBC: 4.35 MIL/uL (ref 4.22–5.81)
RDW: 15.2 % (ref 11.5–15.5)
WBC: 11.7 10*3/uL — ABNORMAL HIGH (ref 4.0–10.5)
nRBC: 0.2 % (ref 0.0–0.2)

## 2020-12-28 LAB — GLUCOSE, CAPILLARY: Glucose-Capillary: 111 mg/dL — ABNORMAL HIGH (ref 70–99)

## 2020-12-28 MED ORDER — FLUTICASONE-SALMETEROL 100-50 MCG/DOSE IN AEPB: 1.0000 | INHALATION_SPRAY | Freq: Two times a day (BID) | RESPIRATORY_TRACT | 0 refills | Status: DC

## 2020-12-28 NOTE — Discharge Summary (Signed)
Physician Discharge Summary  Patient ID: Curtis Rosales MRN: [id number] DOB/AGE: 1961-02-02 60 y.o.  Admit date: 12/27/2020 Discharge date: 12/28/2020  Admission Diagnoses:  Discharge Diagnoses:  Principal Problem:   Near syncope Active Problems:   CAD (coronary artery disease)   Pure hypercholesterolemia   Acute on chronic respiratory failure with hypoxia (HCC)   SIRS (systemic inflammatory response syndrome) (HCC)   Hypotension   Discharged Condition: good  Hospital Course:  Curtis Rosales is a 60 y.o. male with medical history significant of CAD status post MI and stents, hyperlipidemia, GERD, recent COVID-19 infection last month on home oxygen who presents with worsening shortness of breath and weakness at home.  He was also found to have hypotension, received IV fluids.  Blood pressure was better afterwards.  Patient was on 2 L home oxygen after discharge from the hospital from prior Covid admission, he was requiring 6 L oxygen when he came to the hospital.  His condition had improved today, currently he is on 2 L oxygen which is his baseline.  At this point, he is medically stable to be discharged.  #1.  Acute on chronic hypoxemic respiratory failure. Recent Covid pneumonia. 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 I personally reviewed patient CT scan images, patient still has significant pleural-based groundglass changes, also has some interstitial changes. Patient may need long-term oxygen.  Worsening hypoxemia yesterday probably due to low blood pressure and exertion.  Oxygenation is better today.  I do not see any acute bacterial infection, no volume overload. Continue as needed albuterol, also added Advair to the regimen.  With PCP as outpatient.  2.  Hypotension. Near syncope. SIRS. Patient does not have acute infection.  Hypotension resolved after fluids, it appears that patient had some dehydration. Patient will be followed by PCP as outpatient  to make sure patient does not have symptoms again. If patient continues to have near syncope episode, may consider check adrenal function as patient had a course of steroids to treat his COVID.  #3.  Type 2 diabetes.  Controlled without complication. Resume home treatment   Consults: None  Significant Diagnostic Studies:  CT:  CT ANGIOGRAPHY CHEST WITH CONTRAST  TECHNIQUE: Multidetector CT imaging of the chest was performed using the standard protocol during bolus administration of intravenous contrast. Multiplanar CT image reconstructions and MIPs were obtained to evaluate the vascular anatomy.  CONTRAST:  67m OMNIPAQUE IOHEXOL 350 MG/ML SOLN  COMPARISON:  Chest x-ray 12/27/2020, CT 12/03/2020  FINDINGS: Cardiovascular: Satisfactory opacification of the pulmonary arteries to the segmental level. No evidence of pulmonary embolism. Normal heart size. No pericardial effusion. Nonaneurysmal aorta. No dissection is seen. Mild aortic atherosclerosis. Coronary vascular calcification. No significant pericardial effusion  Mediastinum/Nodes: Midline trachea. No thyroid mass. Subcentimeter mediastinal and hilar nodes. Esophagus within normal limits.  Lungs/Pleura: No pneumothorax or pleural effusion. Widespread coarse bandlike densities with mild associated ground-glass density felt most suggestive of post infectious/post inflammatory scarring.  Upper Abdomen: No acute abnormality.  Musculoskeletal: No chest wall abnormality. No acute or significant osseous findings.  Review of the MIP images confirms the above findings.  IMPRESSION: 1. Negative for acute pulmonary embolus or aortic dissection. 2. Widespread coarse bandlike densities within the bilateral lungs with mild associated ground-glass density, felt most suggestive of post infectious/post inflammatory scarring. 3. Aortic atherosclerosis.  Aortic Atherosclerosis (ICD10-I70.0).   Electronically  Signed   By: KDonavan FoilM.D.   On: 12/27/2020 18:04  Treatments: IV fluids, O2  Discharge Exam: Blood pressure 130/81,  pulse 85, temperature 98.2 F (36.8 C), temperature source Oral, resp. rate 14, height _0  (1.702 m), weight 82.4 kg, SpO2 96 %. General appearance: alert and cooperative Resp: clear to auscultation bilaterally Cardio: regular rate and rhythm, S1, S2 normal, no murmur, click, rub or gallop GI: soft, non-tender; bowel sounds normal; no masses,  no organomegaly Extremities: extremities normal, atraumatic, no cyanosis or edema  Disposition: Discharge disposition: 01-Home or Self Care       Discharge Instructions    Diet - low sodium heart healthy   Complete by: As directed    Increase activity slowly   Complete by: As directed      Allergies as of 12/28/2020      Reactions   Ibuprofen Other (See Comments)   Heart issue      Medication List    STOP taking these medications   predniSONE 10 MG tablet Commonly known as: DELTASONE     TAKE these medications   acetaminophen 500 MG tablet Commonly known as: TYLENOL Take 1,000 mg by mouth every 6 (six) hours as needed for mild pain.   albuterol 108 (90 Base) MCG/ACT inhaler Commonly known as: VENTOLIN HFA Inhale 2 puffs into the lungs every 4 (four) hours as needed for shortness of breath or wheezing.   aspirin EC 81 MG tablet Take 1 tablet (81 mg total) by mouth daily.   benzonatate 200 MG capsule Commonly known as: TESSALON Take 1 capsule (200 mg total) by mouth 2 (two) times daily as needed for cough.   blood glucose meter kit and supplies Kit Dispense based on patient and insurance preference. Use up to four times daily as directed. (FOR ICD-9 250.00, 250.01).   carvedilol 3.125 MG tablet Commonly known as: COREG Take 1 tablet by mouth twice daily What changed: when to take this   chlorpheniramine-HYDROcodone 10-8 MG/5ML Suer Commonly known as: TUSSIONEX Take 5 mLs by mouth every 12  (twelve) hours as needed for cough.   Fluticasone-Salmeterol 100-50 MCG/DOSE Aepb Commonly known as: Advair Diskus Inhale 1 puff into the lungs in the morning and at bedtime.   metFORMIN 500 MG tablet Commonly known as: Glucophage Take 1 tablet (500 mg total) by mouth 2 (two) times daily with a meal. (see discharge instructions for additional recommendations on how to take)   multivitamin with minerals Tabs tablet Take 1 tablet by mouth daily.   nitroGLYCERIN 0.4 MG SL tablet Commonly known as: NITROSTAT DISSOLVE ONE TABLET UNDER THE TONGUE EVERY 5 MINUTES AS NEEDED FOR CHEST PAIN.  DO NOT EXCEED A TOTAL OF 3 DOSES IN 15 MINUTES. Please keep upcoming appt. Thank you What changed:   how much to take  how to take this  when to take this  reasons to take this  additional instructions   omeprazole 20 MG tablet Commonly known as: PRILOSEC OTC Take 20 mg by mouth daily.   pravastatin 40 MG tablet Commonly known as: PRAVACHOL Take 1 tablet by mouth once daily   promethazine 25 MG tablet Commonly known as: PHENERGAN Take 1 tablet (25 mg total) by mouth every 6 (six) hours as needed for nausea or vomiting.       Follow-up Information    [name], MD Follow up in 1 week(s).   Specialty: Family Medicine Contact information: [place] [address] [phone]        Sherren Mocha, MD .   Specialty: Cardiology Contact information: [phone] [address] [place] [address]  [phone]               Signed: Sharen Hones 12/28/2020, 10:22 AM

## 2020-12-29 LAB — URINE CULTURE: Culture: NO GROWTH

## 2020-12-30 NOTE — Telephone Encounter (Signed)
Spoke with the patient's wife who states they are worried because atherosclerosis was seen on recent CT.  "Mild aortic atherosclerosis. Coronary vascular calcification. No significant pericardial effusion"  Confirmed with her the patient is taking ASA and pravastatin. The patient last had an updated lipid panel in April 2021 and never called back for repeat. Scheduled the patient for FLP 3/16 prior to visit with Scott on 3/22. The patient's wife was grateful for call and agrees with plan.

## 2020-12-31 ENCOUNTER — Ambulatory Visit: Payer: 59 | Attending: Internal Medicine

## 2021-01-01 LAB — CULTURE, BLOOD (ROUTINE X 2)
Culture: NO GROWTH
Culture: NO GROWTH

## 2021-01-07 ENCOUNTER — Ambulatory Visit: Payer: 59

## 2021-01-15 ENCOUNTER — Other Ambulatory Visit: Payer: 59 | Admitting: *Deleted

## 2021-01-15 ENCOUNTER — Other Ambulatory Visit: Payer: Self-pay

## 2021-01-15 DIAGNOSIS — E782 Mixed hyperlipidemia: Secondary | ICD-10-CM

## 2021-01-15 LAB — LIPID PANEL
Chol/HDL Ratio: 5.2 ratio — ABNORMAL HIGH (ref 0.0–5.0)
Cholesterol, Total: 162 mg/dL (ref 100–199)
HDL: 31 mg/dL — ABNORMAL LOW (ref >39)
LDL Chol Calc (NIH): 91 mg/dL (ref 0–99)
Triglycerides: 233 mg/dL — ABNORMAL HIGH (ref 0–149)
VLDL Cholesterol Cal: 40 mg/dL (ref 5–40)

## 2021-01-20 NOTE — Progress Notes (Signed)
Cardiology Office Note:    Date:  01/21/2021   ID:  Curtis Rosales, DOB Feb 24, 1961, MRN [id number]  PCP:  Sofie Hartigan, MD   [place]  Cardiologist:  Sherren Mocha, MD   Electrophysiologist:  None       Referring MD: [name], MD   Chief Complaint:  Follow-up (CAD)    Patient Profile:    Curtis Rosales is a 60 y.o. male with:   Coronary artery disease   [place] STEMI in 2013 c/b OOH VF arrest>>S/p DES to LAD, POBA to Dx  Ischemic CM  Echocardiogram 9/13: EF 25-30  Echocardiogram 3/14: EF 50-55  Hypertension   Hyperlipidemia   Aortic atherosclerosis (CT 12/2020)  Diabetes mellitus (dx 11/2020)  Hx of COVD-19  admx in 11/2020 w hypoxic resp failure 2/2 COVID-19 pneumonia  Prior CV studies: GATED SPECT MYO PERF W/EXERCISE STRESS 1D 01/28/2017 Narrative  Pt walked for 8:30 of a Bruce protocol GXt. Peak HR of 153 which is 93% predicted maximal HR  There were no ST or T wave changes to suggest ischemia.  This is a low risk study.  Nuclear stress EF: 54%. The left ventricular ejection fraction is normal (55-65%).  The study is normal.   Echocardiogram 01/03/13 Mild LVH, mild focal basal spetal hypertrophy, EF 50-55, ant-sept HK, Gr 1 DD, mild LAE  LHC (9/13):  pLAD occl, critical stenosis involving D1 bifurcation, pCFX 30%, anterolateral and apcial AK, EF 30% PCI:  3 x 16 mm Promus DES to pLAD and POBA to D1  History of Present Illness:    Curtis Rosales was last seen by Dr. Burt Knack in 4/21.  He was admitted 1/23-2/13 with hypoxic respiratory failure due to COVID-19 pneumonia c/b pneumomediastinum.  He was managed with steroids as an outpatient initially.  He completed a course of remdesevir.  He was readmitted 2/25-2/26 with worsening shortness of breath, weakness, near syncope and hypotension.  Chest CT showed pleural-based ground glass changes and interstitial changes.    He returns for f/u.  Recent Lipid panel showed Triglycerides 233,  HDL 31, LDL 91.  He returns for follow-up.  He was noted to have coronary calcifications on his CT in the hospital and was asked to follow-up with cardiology after DC.  Of note, he did have an electrocardiogram 12/28/2020 that demonstrated atrial flutter with variable AV block, HR 71.  I do not see this outlined in the notes.  Since discharge from the hospital, he does feel he is getting stronger.  He is less short of breath.  He has follow-up with pulmonology soon.  He has not had chest discomfort, syncope, orthopnea, leg edema.      Past Medical History:  Diagnosis Date  . CAD (coronary artery disease)    a. 07/2012 Anterolateral MI/Cath: LAD 100 100%-> 3.0x16 Promus Element DES, otw nonobs dzs, EF 30%  . Cardiac arrest ([place])   . DM2 (diabetes mellitus, type 2) ([place])   . History of COVID-19    prolonged hospitalization c/b pneumonia, pneumomediastinum  . HLD (hyperlipidemia)   . HTN (hypertension)   . Ischemic cardiomyopathy    a. 07/2012 Echo: EF 25-30% w/ mid ant, basal inf, sept Sev HK & apical, dist post, mid/dist inf, dist ant, dist lat AK. Gr 1 DD, mild MR. // Echo 3/14: EF 50-55  . MI (myocardial infarction) ([place])   . VF (ventricular fibrillation) ([place])    a. in setting of MI 07/2012    Current Medications: Current  Meds  Medication Sig  . acetaminophen (TYLENOL) 500 MG tablet Take 1,000 mg by mouth every 6 (six) hours as needed for mild pain.  Marland Kitchen albuterol (VENTOLIN HFA) 108 (90 Base) MCG/ACT inhaler Inhale 2 puffs into the lungs every 4 (four) hours as needed for shortness of breath or wheezing.  Marland Kitchen aspirin EC 81 MG tablet Take 1 tablet (81 mg total) by mouth daily.  . benzonatate (TESSALON) 200 MG capsule Take 1 capsule (200 mg total) by mouth 2 (two) times daily as needed for cough.  . blood glucose meter kit and supplies KIT Dispense based on patient and insurance preference. Use up to four times daily as directed. (FOR ICD-9 250.00, 250.01).  . carvedilol (COREG) 3.125 MG tablet  Take 1 tablet by mouth twice daily  . Fluticasone-Salmeterol (ADVAIR DISKUS) 100-50 MCG/DOSE AEPB Inhale 1 puff into the lungs in the morning and at bedtime.  . metFORMIN (GLUCOPHAGE) 500 MG tablet Take 1 tablet (500 mg total) by mouth 2 (two) times daily with a meal. (see discharge instructions for additional recommendations on how to take)  . Multiple Vitamin (MULTIVITAMIN WITH MINERALS) TABS tablet Take 1 tablet by mouth daily.  . nitroGLYCERIN (NITROSTAT) 0.4 MG SL tablet DISSOLVE ONE TABLET UNDER THE TONGUE EVERY 5 MINUTES AS NEEDED FOR CHEST PAIN.  DO NOT EXCEED A TOTAL OF 3 DOSES IN 15 MINUTES. Please keep upcoming appt. Thank you (Patient taking differently: DISSOLVE ONE TABLET UNDER THE TONGUE EVERY 5 MINUTES AS NEEDED FOR CHEST PAIN.  DO NOT EXCEED A TOTAL OF 3 DOSES IN 15 MINUTES. Please keep upcoming appt. Thank you)  . omeprazole (PRILOSEC OTC) 20 MG tablet Take 20 mg by mouth daily.  . pravastatin (PRAVACHOL) 40 MG tablet Take 1 tablet by mouth once daily     Allergies:   Ibuprofen   Social History   Tobacco Use  . Smoking status: Never Smoker  . Smokeless tobacco: Never Used  Vaping Use  . Vaping Use: Never used  Substance Use Topics  . Alcohol use: No  . Drug use: No     Family Hx: The patient's family history includes Heart attack (age of onset: 53) in an other family member; Heart attack (age of onset: 51) in his sister; Heart attack (age of onset: 78) in his father.  ROS   EKGs/Labs/Other Test Reviewed:    EKG:  EKG is   ordered today.  The ekg ordered today demonstrates NSR, HR 91, normal axis, nonspecific ST-T wave changes, QTC 435  Recent Labs: 12/15/2020: Magnesium 2.2 12/27/2020: B Natriuretic Peptide 67.0 12/28/2020: ALT 41; BUN 16; Creatinine, Ser 0.79; Hemoglobin 12.1; Platelets 261; Potassium 3.8; Sodium 141   Recent Lipid Panel Lab Results  Component Value Date/Time   CHOL 162 01/15/2021 07:51 AM   TRIG 233 (H) 01/15/2021 07:51 AM   HDL 31 (L)  01/15/2021 07:51 AM   CHOLHDL 5.2 (H) 01/15/2021 07:51 AM   CHOLHDL 4.9 11/15/2015 09:33 AM   LDLCALC 91 01/15/2021 07:51 AM   LDLDIRECT 81.1 09/21/2014 08:34 AM      Risk Assessment/Calculations:    CHA2DS2-VASc Score = 3  This indicates a 3.2% annual risk of stroke. The patient's score is based upon: CHF History: No HTN History: Yes Diabetes History: Yes Stroke History: No Vascular Disease History: Yes Age Score: 0 Gender Score: 0     Physical Exam:    VS:  BP 100/62   Pulse 97   Ht _0  (1.803 m)   Wt  194 lb (88 kg)   SpO2 97%   BMI 27.06 kg/m     Wt Readings from Last 3 Encounters:  01/21/21 194 lb (88 kg)  12/28/20 181 lb 9.6 oz (82.4 kg)  12/20/20 178 lb (80.7 kg)     Constitutional:      Appearance: Healthy appearance. Not in distress.  Pulmonary:     Effort: Pulmonary effort is normal.     Breath sounds: No wheezing. No rales.  Cardiovascular:     Normal rate. Regular rhythm. Normal S1. Normal S2.     Murmurs: There is no murmur.  Edema:    Peripheral edema absent.  Abdominal:     Palpations: Abdomen is soft.  Musculoskeletal:     Cervical back: Neck supple. Skin:    General: Skin is warm and dry.  Neurological:     General: No focal deficit present.     Mental Status: Alert and oriented to person, place and time.     Cranial Nerves: Cranial nerves are intact.       ASSESSMENT & PLAN:    1. Coronary artery disease involving native coronary artery of native heart without angina pectoris History of anterior MI in 2013 treated with DES to the LAD and balloon angioplasty to the diagonal.  His last Myoview was in 2018 and was negative for ischemia.  He is currently doing well without anginal symptoms.  I explained to him the findings of coronary calcifications on CT is not surprising given his history.  Continue aspirin, beta-blocker, statin therapy.  2. Typical atrial flutter (HCC) CHA2DS2-VASc Score = 3 [CHF History: No, HTN History: Yes,  Diabetes History: Yes, Stroke History: No, Vascular Disease History: Yes, Age Score: 0, Gender Score: 0].  Therefore, the patient's annual risk of stroke is 3.2 %.   I reviewed his chart today and he had an EKG that demonstrated atrial flutter during his second hospitalization.  Repeat EKG today demonstrates sinus rhythm.  I suspect that this may have been related to his acute illness.  Therefore, I will not start anticoagulation at this point.  I will set him up for an echocardiogram and 30-day event monitor.  If he has recurrent atrial flutter, we will need to start anticoagulation.  Follow-up with Dr. Burt Knack or me in 6 to 8 weeks.  3. History of COVID-19 He had a prolonged course and was discharged home on oxygen.  He seems to be doing better and has follow-up with pulmonology later this week.  4. Mixed hyperlipidemia Recent lipid panel demonstrated LDL above goal and high triglycerides.  Given his recent illness, I suspect he may have not been taking his medications on a regular basis.  Before we make any adjustments, I prefer repeating his labs in 3 months.  If his LDL remains above goal, we can consider changing pravastatin to rosuvastatin.   Dispo:  Return in about 8 weeks (around 03/18/2021) for Routine follow up in person with Richardson Dopp, PA.   Medication Adjustments/Labs and Tests Ordered: Current medicines are reviewed at length with the patient today.  Concerns regarding medicines are outlined above.  Tests Ordered: Orders Placed This Encounter  Procedures  . Hepatic function panel  . Lipid Profile  . Cardiac event monitor  . Cardiac event monitor  . EKG 12-Lead  . ECHOCARDIOGRAM COMPLETE  . ECHOCARDIOGRAM COMPLETE   Medication Changes: No orders of the defined types were placed in this encounter.   Signed, Richardson Dopp, PA-C  01/21/2021 4:54 PM  [facility] [place], [place], [place]  [address] Phone: [phone]; Fax: [phone]

## 2021-01-21 ENCOUNTER — Other Ambulatory Visit: Payer: Self-pay

## 2021-01-21 ENCOUNTER — Encounter: Payer: Self-pay | Admitting: *Deleted

## 2021-01-21 ENCOUNTER — Encounter: Payer: Self-pay | Admitting: Physician Assistant

## 2021-01-21 ENCOUNTER — Ambulatory Visit: Payer: 59 | Admitting: Physician Assistant

## 2021-01-21 VITALS — BP 100/62 | HR 97 | Ht 71.0 in | Wt 194.0 lb

## 2021-01-21 DIAGNOSIS — Z8616 Personal history of COVID-19: Secondary | ICD-10-CM | POA: Diagnosis not present

## 2021-01-21 DIAGNOSIS — E782 Mixed hyperlipidemia: Secondary | ICD-10-CM

## 2021-01-21 DIAGNOSIS — I2510 Atherosclerotic heart disease of native coronary artery without angina pectoris: Secondary | ICD-10-CM

## 2021-01-21 DIAGNOSIS — I483 Typical atrial flutter: Secondary | ICD-10-CM | POA: Diagnosis not present

## 2021-01-21 NOTE — Patient Instructions (Signed)
Medication Instructions:  Your physician recommends that you continue on your current medications as directed. Please refer to the Current Medication list given to you today.  *If you need a refill on your cardiac medications before your next appointment, please call your pharmacy*   Lab Work: Your physician recommends that you return for a FASTING lipid profile and lft: Monday, June 20 between 7:30 and 4:30 fasting after midnight.   If you have labs (blood work) drawn today and your tests are completely normal, you will receive your results only by: Marland Kitchen MyChart Message (if you have MyChart) OR . A paper copy in the mail If you have any lab test that is abnormal or we need to change your treatment, we will call you to review the results.   Testing/Procedures: Your physician has requested that you have an echocardiogram.Monday, April 25 @ 10:35 am. Echocardiography is a painless test that uses sound waves to create images of your heart. It provides your doctor with information about the size and shape of your heart and how well your heart's chambers and valves are working. This procedure takes approximately one hour. There are no restrictions for this procedure.   Echocardiogram An echocardiogram is a test that uses sound waves (ultrasound) to produce images of the heart. Images from an echocardiogram can provide important information about:  Heart size and shape.  The size and thickness and movement of your heart's walls.  Heart muscle function and strength.  Heart valve function or if you have stenosis. Stenosis is when the heart valves are too narrow.  If blood is flowing backward through the heart valves (regurgitation).  A tumor or infectious growth around the heart valves.  Areas of heart muscle that are not working well because of poor blood flow or injury from a heart attack.  Aneurysm detection. An aneurysm is a weak or damaged part of an artery wall. The wall bulges out  from the normal force of blood pumping through the body. Tell a health care provider about:  Any allergies you have.  All medicines you are taking, including vitamins, herbs, eye drops, creams, and over-the-counter medicines.  Any blood disorders you have.  Any surgeries you have had.  Any medical conditions you have.  Whether you are pregnant or may be pregnant. What are the risks? Generally, this is a safe test. However, problems may occur, including an allergic reaction to dye (contrast) that may be used during the test. What happens before the test? No specific preparation is needed. You may eat and drink normally. What happens during the test?  You will take off your clothes from the waist up and put on a hospital gown.  Electrodes or electrocardiogram (ECG)patches may be placed on your chest. The electrodes or patches are then connected to a device that monitors your heart rate and rhythm.  You will lie down on a table for an ultrasound exam. A gel will be applied to your chest to help sound waves pass through your skin.  A handheld device, called a transducer, will be pressed against your chest and moved over your heart. The transducer produces sound waves that travel to your heart and bounce back (or "echo" back) to the transducer. These sound waves will be captured in real-time and changed into images of your heart that can be viewed on a video monitor. The images will be recorded on a computer and reviewed by your health care provider.  You may be asked to change positions  or hold your breath for a short time. This makes it easier to get different views or better views of your heart.  In some cases, you may receive contrast through an IV in one of your veins. This can improve the quality of the pictures from your heart. The procedure may vary among health care providers and hospitals.   What can I expect after the test? You may return to your normal, everyday life,  including diet, activities, and medicines, unless your health care provider tells you not to do that. Follow these instructions at home:  It is up to you to get the results of your test. Ask your health care provider, or the department that is doing the test, when your results will be ready.  Keep all follow-up visits. This is important. Summary  An echocardiogram is a test that uses sound waves (ultrasound) to produce images of the heart.  Images from an echocardiogram can provide important information about the size and shape of your heart, heart muscle function, heart valve function, and other possible heart problems.  You do not need to do anything to prepare before this test. You may eat and drink normally.  After the echocardiogram is completed, you may return to your normal, everyday life, unless your health care provider tells you not to do that. This information is not intended to replace advice given to you by your health care provider. Make sure you discuss any questions you have with your health care provider. Document Revised: 06/11/2020 Document Reviewed: 06/11/2020 Elsevier Patient Education  2021 Elsevier Inc.   [employer] Cardiac Event Monitor Instructions Your physician has requested you wear your cardiac event monitor for _____ days, (1-30). [employer] may call or text to confirm a shipping address. The monitor will be sent to a land address via UPS. [employer] will not ship a monitor to a PO BOX. It typically takes 3-5 days to receive your monitor after it has been enrolled. [employer] will assist with USPS tracking if your package is delayed. The telephone number for [employer] is [phone]. Once you have received your monitor, please review the enclosed instructions. Instruction tutorials can also be viewed under help and settings on the enclosed cell phone. Your monitor has already been registered assigning a specific monitor serial # to you.  Applying the  monitor Remove cell phone from case and turn it on. The cell phone works as IT consultant and needs to be within [employer] of you at all times. The cell phone will need to be charged on a daily basis. We recommend you plug the cell phone into the enclosed charger at your bedside table every night.  Monitor batteries: You will receive two monitor batteries labelled #1 and #2. These are your recorders. Plug battery #2 onto the second connection on the enclosed charger. Keep one battery on the charger at all times. This will keep the monitor battery deactivated. It will also keep it fully charged for when you need to switch your monitor batteries. A small light will be blinking on the battery emblem when it is charging. The light on the battery emblem will remain on when the battery is fully charged.  Open package of a Monitor strip. Insert battery #1 into black hood on strip and gently squeeze monitor battery onto connection as indicated in instruction booklet. Set aside while preparing skin.  Choose location for your strip, vertical or horizontal, as indicated in the instruction booklet. Shave to remove all hair from location. There  cannot be any lotions, oils, powders, or colognes on skin where monitor is to be applied. Wipe skin clean with enclosed Saline wipe. Dry skin completely.  Peel paper labeled #1 off the back of the Monitor strip exposing the adhesive. Place the monitor on the chest in the vertical or horizontal position shown in the instruction booklet. One arrow on the monitor strip must be pointing upward. Carefully remove paper labeled #2, attaching remainder of strip to your skin. Try not to create any folds or wrinkles in the strip as you apply it.  Firmly press and release the circle in the center of the monitor battery. You will hear a small beep. This is turning the monitor battery on. The heart emblem on the monitor battery will light up every 5 seconds if the monitor  battery in turned on and connected to the patient securely. Do not push and hold the circle down as this turns the monitor battery off. The cell phone will locate the monitor battery. A screen will appear on the cell phone checking the connection of your monitor strip. This may read poor connection initially but change to good connection within the next minute. Once your monitor accepts the connection you will hear a series of 3 beeps followed by a climbing crescendo of beeps. A screen will appear on the cell phone showing the two monitor strip placement options. Touch the picture that demonstrates where you applied the monitor strip.  Your monitor strip and battery are waterproof. You are able to shower, bathe, or swim with the monitor on. They just ask you do not submerge deeper than 3 feet underwater. We recommend removing the monitor if you are swimming in a lake, river, or ocean.  Your monitor battery will need to be switched to a fully charged monitor battery approximately once a week. The cell phone will alert you of an action which needs to be made.  On the cell phone, tap for details to reveal connection status, monitor battery status, and cell phone battery status. The green dots indicates your monitor is in good status. A red dot indicates there is something that needs your attention.  To record a symptom, click the circle on the monitor battery. In 30-60 seconds a list of symptoms will appear on the cell phone. Select your symptom and tap save. Your monitor will record a sustained or significant arrhythmia regardless of you clicking the button. Some patients do not feel the heart rhythm irregularities. [employer] will notify us of any serious or critical events.  Refer to instruction booklet for instructions on switching batteries, changing strips, the Do not disturb or Pause features, or any additional questions.  Call [employer] at [phone], to confirm your monitor is  transmitting and record your baseline. They will answer any questions you may have regarding the monitor instructions at that time.  Returning the monitor to [employer] Place all equipment back into blue box. Peel off strip of paper to expose adhesive and close box securely. There is a prepaid UPS shipping label on this box. Drop in a UPS drop box, or at a UPS facility like [employer]. You may also contact [employer] to arrange UPS to pick up monitor package at your home.   Follow-Up: At [facility], you and your health needs are our priority.  As part of our continuing mission to provide you with exceptional heart care, we have created designated Provider Care Teams.  These Care Teams include your primary Cardiologist (physician) and Advanced Practice  Providers (APPs -  Physician Assistants and Nurse Practitioners) who all work together to provide you with the care you need, when you need it.  We recommend signing up for the patient portal called "MyChart".  Sign up information is provided on this After Visit Summary.  MyChart is used to connect with patients for Virtual Visits (Telemedicine).  Patients are able to view lab/test results, encounter notes, upcoming appointments, etc.  Non-urgent messages can be sent to your provider as well.   To learn more about what you can do with MyChart, go to [email or web address].    Your next appointment:   8 week(s)  The format for your next appointment:   In Person  Provider:   You will see one of the following Advanced Practice Providers on your designated Care Team:    Tereso Newcomer, Cordelia Poche on Tuesday, May 10 @ 10:15 am       Other Instructions -None

## 2021-01-21 NOTE — Progress Notes (Signed)
Patient ID: Curtis Rosales, male   DOB: May 13, 1961, 60 y.o.   MRN: [id number] Patient enrolled for [employer] to ship a 30 day cardiac event monitor to his home.

## 2021-01-24 ENCOUNTER — Ambulatory Visit
Admission: RE | Admit: 2021-01-24 | Discharge: 2021-01-24 | Disposition: A | Discharge status: Home / Self Care | Payer: 59 | Source: Ambulatory Visit | Attending: Adult Health | Admitting: Adult Health

## 2021-01-24 ENCOUNTER — Ambulatory Visit (INDEPENDENT_AMBULATORY_CARE_PROVIDER_SITE_OTHER): Payer: 59

## 2021-01-24 ENCOUNTER — Other Ambulatory Visit: Payer: Self-pay

## 2021-01-24 ENCOUNTER — Ambulatory Visit (INDEPENDENT_AMBULATORY_CARE_PROVIDER_SITE_OTHER): Payer: 59 | Admitting: Adult Health

## 2021-01-24 ENCOUNTER — Encounter: Payer: Self-pay | Admitting: Adult Health

## 2021-01-24 VITALS — BP 102/62 | HR 95 | Temp 97.3°F | Ht 71.0 in | Wt 195.0 lb

## 2021-01-24 DIAGNOSIS — J1282 Pneumonia due to coronavirus disease 2019: Secondary | ICD-10-CM

## 2021-01-24 DIAGNOSIS — U071 COVID-19: Secondary | ICD-10-CM | POA: Insufficient documentation

## 2021-01-24 DIAGNOSIS — I483 Typical atrial flutter: Secondary | ICD-10-CM

## 2021-01-24 DIAGNOSIS — Z8616 Personal history of COVID-19: Secondary | ICD-10-CM

## 2021-01-24 DIAGNOSIS — R5381 Other malaise: Secondary | ICD-10-CM | POA: Insufficient documentation

## 2021-01-24 DIAGNOSIS — J9611 Chronic respiratory failure with hypoxia: Secondary | ICD-10-CM | POA: Insufficient documentation

## 2021-01-24 DIAGNOSIS — R55 Syncope and collapse: Secondary | ICD-10-CM | POA: Diagnosis not present

## 2021-01-24 DIAGNOSIS — I2510 Atherosclerotic heart disease of native coronary artery without angina pectoris: Secondary | ICD-10-CM

## 2021-01-24 DIAGNOSIS — E782 Mixed hyperlipidemia: Secondary | ICD-10-CM

## 2021-01-24 MED ORDER — PREDNISONE 10 MG PO TABS: ORAL_TABLET | ORAL | 0 refills | Status: DC

## 2021-01-24 NOTE — Patient Instructions (Addendum)
Prednisone 20mg  daily for 1 week then 10mg  daily for 1 week and stop .  Albuterol inhaler As needed   Continue on Oxygen 2l/m, to maintain O2 sats >88-90%.  Order for POC .  Refer to Pulmonary Rehab.  Activity as tolerated .  Chest xray today  Follow up with Dr. in 6 weeks with PFT.  Please contact office for sooner follow up if symptoms do not improve or worsen or seek emergency care

## 2021-01-24 NOTE — Progress Notes (Signed)
_0  ID: Ree Shay, male    DOB: [birth year], 60 y.o.   MRN: [id number]  No chief complaint on file.   Referring provider: [name], MD  HPI: 60 year old male  smoker seen for pulmonary consult December 20, 2020 for post COVID hypoxic respiratory failure. Patient was admitted for prolonged hospitalization January 2022 with acute respiratory failure, [id number] infection complicated by Covid pneumonia Medical history significant for coronary artery disease, cardiomyopathy, diabetes  TEST/EVENTS :   01/24/2021 Follow up ; Covid pneumonia, chronic respiratory failure, post hospital follow-up Patient returns for a 1 month follow-up.  Patient was seen last visit for a pulmonary consult after having severe Covid in January 2022.  Patient was admitted to the hospital for about 3 weeks with acute respiratory failure requiring high flow oxygen.  He did develop a pneumomediastinum.  Covid pneumonia.  He was treated with aggressive pulmonary hygiene, high flow oxygen, steroids, antibiotics.  He did require oxygen at discharge at 4 L and 6 L with activity.  Patient says that after last visit he had continued shortness of breath with activities.  And had an episode while using the bathroom where his oxygen level dropped down and he become extremely weak.  He had very low oxygen levels and went to the emergency room and required hospitalization Patient was recently readmitted to the hospital with acute on chronic respiratory failure, hypotension.  He required IV fluids.  Was worked up for possible acute infection but was ruled out.  Felt to be dehydrated. CT chest December 27, 2020 showed no pneumothorax, negative PE, widespread bandlike densities with mild associated groundglass densities felt most suggestive of a postinfectious scarring. Since discharge patient is doing better.  His oxygen requirements have gone down and he maintains on 2 L of oxygen.  He states his shortness of breath is still  present but he has some improved activity tolerance.  He still gets short of breath easily.  But is trying to walk with his oxygen tanks.  He denies any significant cough now.  Cough has also decreased.  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  And now  is not able to work and gets winded easily.   Allergies  Allergen Reactions  . Ibuprofen Other (See Comments)    Heart issue     There is no immunization history on file for this patient.  Past Medical History:  Diagnosis Date  . CAD (coronary artery disease)    a. 07/2012 Anterolateral MI/Cath: LAD 100 100%-> 3.0x16 Promus Element DES, otw nonobs dzs, EF 30%  . Cardiac arrest ([place])   . DM2 (diabetes mellitus, type 2) ([place])   . History of COVID-19    prolonged hospitalization c/b pneumonia, pneumomediastinum  . HLD (hyperlipidemia)   . HTN (hypertension)   . Ischemic cardiomyopathy    a. 07/2012 Echo: EF 25-30% w/ mid ant, basal inf, sept Sev HK & apical, dist post, mid/dist inf, dist ant, dist lat AK. Gr 1 DD, mild MR. // Echo 3/14: EF 50-55  . MI (myocardial infarction) ([place])   . VF (ventricular fibrillation) ([place])    a. in setting of MI 07/2012    Tobacco History: Social History   Tobacco Use  Smoking Status Never Smoker  Smokeless Tobacco Never Used   Counseling given: Not Answered   Outpatient Medications Prior to Visit  Medication Sig Dispense Refill  .  acetaminophen (TYLENOL) 500 MG tablet Take 1,000 mg by mouth every 6 (six) hours as needed for mild pain.    Marland Kitchen aspirin EC 81 MG tablet Take 1 tablet (81 mg total) by mouth daily.    . benzonatate (TESSALON) 200 MG capsule Take 1 capsule (200 mg total) by mouth 2 (two) times daily as needed for cough. 30 capsule 0  . blood glucose meter kit and supplies KIT Dispense based on patient and insurance preference. Use up to four times daily as directed. (FOR ICD-9  250.00, 250.01). 1 each 0  . carvedilol (COREG) 3.125 MG tablet Take 1 tablet by mouth twice daily 180 tablet 3  . Multiple Vitamin (MULTIVITAMIN WITH MINERALS) TABS tablet Take 1 tablet by mouth daily.    . nitroGLYCERIN (NITROSTAT) 0.4 MG SL tablet DISSOLVE ONE TABLET UNDER THE TONGUE EVERY 5 MINUTES AS NEEDED FOR CHEST PAIN.  DO NOT EXCEED A TOTAL OF 3 DOSES IN 15 MINUTES. Please keep upcoming appt. Thank you (Patient taking differently: DISSOLVE ONE TABLET UNDER THE TONGUE EVERY 5 MINUTES AS NEEDED FOR CHEST PAIN.  DO NOT EXCEED A TOTAL OF 3 DOSES IN 15 MINUTES. Please keep upcoming appt. Thank you) 75 tablet 0  . omeprazole (PRILOSEC OTC) 20 MG tablet Take 20 mg by mouth daily.    . pravastatin (PRAVACHOL) 40 MG tablet Take 1 tablet by mouth once daily 90 tablet 3  . albuterol (VENTOLIN HFA) 108 (90 Base) MCG/ACT inhaler Inhale 2 puffs into the lungs every 4 (four) hours as needed for shortness of breath or wheezing. 6.7 g 0  . Fluticasone-Salmeterol (ADVAIR DISKUS) 100-50 MCG/DOSE AEPB Inhale 1 puff into the lungs in the morning and at bedtime. (Patient not taking: Reported on 01/24/2021) 60 each 0  . metFORMIN (GLUCOPHAGE) 500 MG tablet Take 1 tablet (500 mg total) by mouth 2 (two) times daily with a meal. (see discharge instructions for additional recommendations on how to take) 60 tablet 0   No facility-administered medications prior to visit.     Review of Systems:   Constitutional:   No  weight loss, night sweats,  Fevers, chills, + fatigue, or  lassitude.  HEENT:   No headaches,  Difficulty swallowing,  Tooth/dental problems, or  Sore throat,                No sneezing, itching, ear ache, nasal congestion, post nasal drip,   CV:  No chest pain,  Orthopnea, PND, swelling in lower extremities, anasarca, dizziness, palpitations, syncope.   GI  No heartburn, indigestion, abdominal pain, nausea, vomiting, diarrhea, change in bowel habits, loss of appetite, bloody stools.   Resp:   No  chest wall deformity  Skin: no rash or lesions.  GU: no dysuria, change in color of urine, no urgency or frequency.  No flank pain, no hematuria   MS:  No joint pain or swelling.  No decreased range of motion.  No back pain.    Physical Exam  BP 102/62 (BP Location: Left Arm, Cuff Size: Normal)   Pulse 95   Temp (!) 97.3 F (36.3 C) (Temporal)   Ht _0  (1.803 m)   Wt 195 lb (88.5 kg)   SpO2 95%   BMI 27.20 kg/m   GEN: A/Ox3; pleasant , NAD, well nourished , O2    HEENT:  [place]/AT,  NOSE-clear, THROAT-clear, no lesions, no postnasal drip or exudate noted.   NECK:  Supple w/ fair ROM; no JVD; normal carotid impulses w/o bruits; no  thyromegaly or nodules palpated; no lymphadenopathy.    RESP  Clear  P & A; w/o, wheezes/ rales/ or rhonchi. no accessory muscle use, no dullness to percussion  CARD:  RRR, no m/r/g, no peripheral edema, pulses intact, no cyanosis or clubbing.  GI:   Soft & nt; nml bowel sounds; no organomegaly or masses detected.   Musco: Warm bil, no deformities or joint swelling noted.   Neuro: alert, no focal deficits noted.    Skin: Warm, no lesions or rashes    Lab Results:  CBC    Component Value Date/Time   WBC 11.7 (H) 12/28/2020 0443   RBC 4.35 12/28/2020 0443   HGB 12.1 (L) 12/28/2020 0443   HGB 15.2 02/19/2020 1449   HCT 36.0 (L) 12/28/2020 0443   HCT 45.7 02/19/2020 1449   PLT 261 12/28/2020 0443   PLT 299 02/19/2020 1449   MCV 82.8 12/28/2020 0443   MCV 83 02/19/2020 1449   MCH 27.8 12/28/2020 0443   MCHC 33.6 12/28/2020 0443   RDW 15.2 12/28/2020 0443   RDW 14.0 02/19/2020 1449   LYMPHSABS 2.0 12/27/2020 1535   LYMPHSABS 3.0 02/19/2020 1449   MONOABS 1.3 (H) 12/27/2020 1535   EOSABS 0.2 12/27/2020 1535   EOSABS 0.3 02/19/2020 1449   BASOSABS 0.1 12/27/2020 1535   BASOSABS 0.1 02/19/2020 1449    BMET    Component Value Date/Time   NA 141 12/28/2020 0443   NA 141 02/19/2020 1449   K 3.8 12/28/2020 0443   CL 107  12/28/2020 0443   CO2 26 12/28/2020 0443   GLUCOSE 128 (H) 12/28/2020 0443   BUN 16 12/28/2020 0443   BUN 14 02/19/2020 1449   CREATININE 0.79 12/28/2020 0443   CREATININE 0.94 11/15/2015 0933   CALCIUM 9.0 12/28/2020 0443   GFRNONAA >60 12/28/2020 0443   GFRAA 92 02/19/2020 1449    BNP    Component Value Date/Time   BNP 67.0 12/27/2020 1535    ProBNP    Component Value Date/Time   PROBNP 78.3 07/08/2012 2329    Imaging: DG Chest 2 View  Result Date: 12/27/2020 CLINICAL DATA:  Shortness of breath. Recent COVID pneumonia. Previous myocardial infarcts. EXAM: CHEST - 2 VIEW COMPARISON:  12/20/2020 FINDINGS: The heart size and mediastinal contours are within normal limits. Coarse pulmonary opacity throughout both lungs is unchanged, and consistent with scarring and fibrosis. No new or superimposed areas of pulmonary infiltrate are seen. No evidence of pleural effusion. IMPRESSION: Stable coarse bilateral pulmonary opacities, consistent with scarring and fibrosis. No acute findings. Electronically Signed   By: Marlaine Hind M.D.   On: 12/27/2020 16:15   CT Angio Chest PE W and/or Wo Contrast  Result Date: 12/27/2020 CLINICAL DATA:  Shortness of breath 6 EXAM: CT ANGIOGRAPHY CHEST WITH CONTRAST TECHNIQUE: Multidetector CT imaging of the chest was performed using the standard protocol during bolus administration of intravenous contrast. Multiplanar CT image reconstructions and MIPs were obtained to evaluate the vascular anatomy. CONTRAST:  59m OMNIPAQUE IOHEXOL 350 MG/ML SOLN COMPARISON:  Chest x-ray 12/27/2020, CT 12/03/2020 FINDINGS: Cardiovascular: Satisfactory opacification of the pulmonary arteries to the segmental level. No evidence of pulmonary embolism. Normal heart size. No pericardial effusion. Nonaneurysmal aorta. No dissection is seen. Mild aortic atherosclerosis. Coronary vascular calcification. No significant pericardial effusion Mediastinum/Nodes: Midline trachea. No thyroid  mass. Subcentimeter mediastinal and hilar nodes. Esophagus within normal limits. Lungs/Pleura: No pneumothorax or pleural effusion. Widespread coarse bandlike densities with mild associated ground-glass density felt most suggestive of  post infectious/post inflammatory scarring. Upper Abdomen: No acute abnormality. Musculoskeletal: No chest wall abnormality. No acute or significant osseous findings. Review of the MIP images confirms the above findings. IMPRESSION: 1. Negative for acute pulmonary embolus or aortic dissection. 2. Widespread coarse bandlike densities within the bilateral lungs with mild associated ground-glass density, felt most suggestive of post infectious/post inflammatory scarring. 3. Aortic atherosclerosis. Aortic Atherosclerosis (ICD10-I70.0). Electronically Signed   By: Donavan Foil M.D.   On: 12/27/2020 18:04      No flowsheet data found.  No results found for: NITRICOXIDE      Assessment & Plan:   Pneumonia due to COVID-19 virus COVID-19 infection January 5638 complicated by acute respiratory failure, Covid pneumonia. Patient is slowly making clinical improvement.  Oxygen demands are decreased.  Chest x-ray and CT chest last month showed persistent widespread groundglass opacities.  Patient's ESR was elevated.  Questionable Covid pneumonitis.  Patient states he did improve with prednisone previously. We will need follow-up x-ray today.  And most likely a high-resolution CT chest in the next 3 to 6 months. We will check PFTs in 6 weeks which will be 3 months post Covid infection Will tx w/ short low dose steroid taper over next week.   Plan  Patient Instructions  Prednisone 33m daily for 1 week then 149mdaily for 1 week and stop .  Albuterol inhaler As needed   Continue on Oxygen 2l/m, to maintain O2 sats >88-90%.  Order for POC .  Refer to Pulmonary Rehab.  Activity as tolerated .  Chest xray today  Follow up with Dr. GoPatsey Bertholdn 6 weeks with PFT.  Please  contact office for sooner follow up if symptoms do not improve or worsen or seek emergency care          Chronic respiratory failure with hypoxia (HCC) Chronic respiratory failure, decrease oxygen demands.  Maintain O2 saturations greater than 88 to 90%. Patient needs to be able to exercise and be more mobile.  Needs a portable oxygen concentrator to help with this independence  Plan Patient Instructions  Prednisone 2047maily for 1 week then 62m7mily for 1 week and stop .  Albuterol inhaler As needed   Continue on Oxygen 2l/m, to maintain O2 sats >88-90%.  Order for POC .  Refer to Pulmonary Rehab.  Activity as tolerated .  Chest xray today  Follow up with Dr. GonzPatsey Berthold6 weeks with PFT.  Please contact office for sooner follow up if symptoms do not improve or worsen or seek emergency care          Physical deconditioning Refer to pulmonary rehab      TammRexene Edison 01/24/2021

## 2021-01-24 NOTE — Assessment & Plan Note (Signed)
Chronic respiratory failure, decrease oxygen demands.  Maintain O2 saturations greater than 88 to 90%. Patient needs to be able to exercise and be more mobile.  Needs a portable oxygen concentrator to help with this independence  Plan Patient Instructions  Prednisone 20mg  daily for 1 week then 10mg  daily for 1 week and stop .  Albuterol inhaler As needed   Continue on Oxygen 2l/m, to maintain O2 sats >88-90%.  Order for POC .  Refer to Pulmonary Rehab.  Activity as tolerated .  Chest xray today  Follow up with Dr. in 6 weeks with PFT.  Please contact office for sooner follow up if symptoms do not improve or worsen or seek emergency care

## 2021-01-24 NOTE — Assessment & Plan Note (Signed)
Refer to pulmonary rehab °

## 2021-01-24 NOTE — Assessment & Plan Note (Addendum)
COVID-19 infection January 4301 complicated by acute respiratory failure, Covid pneumonia. Patient is slowly making clinical improvement.  Oxygen demands are decreased.  Chest x-ray and CT chest last month showed persistent widespread groundglass opacities.  Patient's ESR was elevated.  Questionable Covid pneumonitis.  Patient states he did improve with prednisone previously. We will need follow-up x-ray today.  And most likely a high-resolution CT chest in the next 3 to 6 months. We will check PFTs in 6 weeks which will be 3 months post Covid infection Will tx w/ short low dose steroid taper over next week.   Plan  Patient Instructions  Prednisone 48m daily for 1 week then 163mdaily for 1 week and stop .  Albuterol inhaler As needed   Continue on Oxygen 2l/m, to maintain O2 sats >88-90%.  Order for POC .  Refer to Pulmonary Rehab.  Activity as tolerated .  Chest xray today  Follow up with Dr. GoPatsey Bertholdn 6 weeks with PFT.  Please contact office for sooner follow up if symptoms do not improve or worsen or seek emergency care

## 2021-01-29 ENCOUNTER — Telehealth: Payer: Self-pay | Admitting: Adult Health

## 2021-01-29 NOTE — Progress Notes (Signed)
ATC x1  LM to return call regarding CXR results.

## 2021-01-29 NOTE — Telephone Encounter (Signed)
Slightly decreased opacities , most like secondary to Covid 19 PNA .  Will discuss in detail at [facility] in May with Dr Jayme Cloud appointment (decide on CT chest at that time ) PFT are pending in May .  Please contact office for sooner follow up if symptoms do not improve or worsen or seek emergency care    Patient's spouse, Lisa(DPR) is aware of results and voiced her understanding. Nothing further needed at this time.

## 2021-01-30 NOTE — Progress Notes (Signed)
Agree with the details of the visit as noted by Tammy Parrett, NP.  C. Laura Demmi Sindt, MD [facility] 

## 2021-01-30 NOTE — Progress Notes (Signed)
Agree with the details of the visit as noted by Tammy Parrett, NP.  C. Laura Reece Fehnel, MD [facility] 

## 2021-02-07 ENCOUNTER — Telehealth: Payer: Self-pay

## 2021-02-07 NOTE — Telephone Encounter (Signed)
Lm for reminder of covid test prior to PFT  02/11/2021 8:40 [facility]

## 2021-02-10 NOTE — Telephone Encounter (Signed)
Patient's spouse, Lisa(DPR) is aware of below message and voiced her understanding.  Nothing further needed at this time.

## 2021-02-11 ENCOUNTER — Ambulatory Visit: Payer: Self-pay | Admitting: Surgery

## 2021-02-11 ENCOUNTER — Other Ambulatory Visit: Payer: Self-pay

## 2021-02-11 ENCOUNTER — Other Ambulatory Visit
Admission: RE | Admit: 2021-02-11 | Discharge: 2021-02-11 | Disposition: A | Discharge status: Home / Self Care | Payer: 59 | Source: Ambulatory Visit | Attending: Adult Health | Admitting: Adult Health

## 2021-02-11 DIAGNOSIS — Z20822 Contact with and (suspected) exposure to covid-19: Secondary | ICD-10-CM | POA: Diagnosis not present

## 2021-02-11 DIAGNOSIS — Z01812 Encounter for preprocedural laboratory examination: Secondary | ICD-10-CM | POA: Insufficient documentation

## 2021-02-11 LAB — SARS CORONAVIRUS 2 (TAT 6-24 HRS): SARS Coronavirus 2: NEGATIVE

## 2021-02-11 NOTE — H&P (Signed)
Subjective:   CC: Non-recurrent unilateral inguinal hernia without obstruction or gangrene [K40.90]  HPI:  Curtis Rosales is a 60 y.o. male who was referred by Jamelle Haring * for evaluation of above. Symptoms were first noted 1 year ago. Pain is intermittent and discomfort, confined to the right groin, without radiation.  Associated with nothing specific, exacerbated by nothing specific  Lump is reducible.   Also due for screening colonoscopy.  Negative family history and no personal history of polyps.  ROS as noted below.   Past Medical History:  has a past medical history of H/O angioplasty (07/08/2012) and History of appendectomy.  Past Surgical History:       Past Surgical History:  Procedure Laterality Date  . APPENDECTOMY      Family History: family history includes Cancer in his mother; Myocardial Infarction (Heart attack) in his father.  Social History:  reports that he has never smoked. He has never used smokeless tobacco. No history on file for alcohol use and drug use.  Current Medications: has a current medication list which includes the following prescription(s): acetaminophen, albuterol, benzonatate, carvedilol, multivitamin with minerals, nitroglycerin, omeprazole, and pravastatin.  Allergies:     Allergies as of 02/11/2021  . (No Known Allergies)    ROS:  A 15 point review of systems was performed and pertinent positives and negatives noted in HPI   Objective:   BP 112/70   Pulse 84   Ht 177.8 cm (5\' 10" )   Wt 89.8 kg (198 lb)   BMI 28.41 kg/m   Constitutional :  alert, appears stated age, cooperative and no distress  Lymphatics/Throat:  no asymmetry, masses, or scars  Respiratory:  clear to auscultation bilaterally  Cardiovascular:  regular rate and rhythm  Gastrointestinal: soft, non-tender; bowel sounds normal; no masses,  no organomegaly. inguinal hernia noted.  small, no overlying skin changes and RIGHT  Musculoskeletal: Steady  gait and movement  Skin: Cool and moist  Psychiatric: Normal affect, non-agitated, not confused       LABS:  n/a   RADS: n/a Assessment:       Non-recurrent unilateral inguinal hernia without obstruction or gangrene [K40.90], RIGHT  Screening colonoscopy  Plan:   1. Non-recurrent unilateral inguinal hernia without obstruction or gangrene [K40.90]   Discussed the risk of surgery including recurrence, which can be up to 50% in the case of incisional or complex hernias, possible use of prosthetic materials (mesh) and the increased risk of mesh infxn if used, bleeding, chronic pain, post-op infxn, post-op SBO or ileus, and possible re-operation to address said risks. The risks of general anesthetic, if used, includes MI, CVA, sudden death or even reaction to anesthetic medications also discussed. Alternatives include continued observation.  Benefits include possible symptom relief, prevention of incarceration, strangulation, enlargement in size over time, and the risk of emergency surgery in the face of strangulation.   Typical post-op recovery time of 3-5 days with 2 weeks of activity restrictions were also discussed.  ED return precautions given for sudden increase in pain, size of hernia with accompanying fever, nausea, and/or vomiting.  The patient verbalized understanding and all questions were answered to the patient's satisfaction.   Patient has elected to proceed with surgical treatment. Procedure will be scheduled.  Written consent was obtained. RIGHT, robotic assisted laparoscopic  2. Schedule elective screening colonoscopy.  R/b/a discussed.  Risks include bleeding, perforation.  Benefits include diagnostic, curative procedure if needed.  Alternatives include continued observation.  Pt verbalized understanding.

## 2021-02-12 ENCOUNTER — Ambulatory Visit: Payer: 59 | Attending: Adult Health

## 2021-02-12 DIAGNOSIS — U071 COVID-19: Secondary | ICD-10-CM | POA: Insufficient documentation

## 2021-02-12 DIAGNOSIS — J1282 Pneumonia due to coronavirus disease 2019: Secondary | ICD-10-CM | POA: Insufficient documentation

## 2021-02-12 MED ORDER — ALBUTEROL SULFATE (2.5 MG/3ML) 0.083% IN NEBU
2.5000 mg | INHALATION_SOLUTION | Freq: Once | RESPIRATORY_TRACT | Status: AC
  Administered 2021-02-12: 2.5 mg via RESPIRATORY_TRACT
  Filled 2021-02-12: qty 3

## 2021-02-24 ENCOUNTER — Ambulatory Visit (HOSPITAL_COMMUNITY): Payer: 59 | Attending: Cardiology

## 2021-02-24 ENCOUNTER — Other Ambulatory Visit: Payer: Self-pay

## 2021-02-24 DIAGNOSIS — Z8616 Personal history of COVID-19: Secondary | ICD-10-CM | POA: Insufficient documentation

## 2021-02-24 DIAGNOSIS — I483 Typical atrial flutter: Secondary | ICD-10-CM | POA: Diagnosis not present

## 2021-02-24 DIAGNOSIS — E782 Mixed hyperlipidemia: Secondary | ICD-10-CM | POA: Diagnosis present

## 2021-02-24 DIAGNOSIS — I2510 Atherosclerotic heart disease of native coronary artery without angina pectoris: Secondary | ICD-10-CM | POA: Insufficient documentation

## 2021-02-24 LAB — ECHOCARDIOGRAM COMPLETE
Area-P 1/2: 3.37 cm2
S' Lateral: 3.3 cm

## 2021-02-25 ENCOUNTER — Encounter: Payer: Self-pay | Admitting: Physician Assistant

## 2021-03-03 ENCOUNTER — Telehealth: Payer: Self-pay | Admitting: Pulmonary Disease

## 2021-03-03 NOTE — Telephone Encounter (Signed)
Spoke to patient's spouse, Lisa(DPR). Misty Stanley stated that she received a call from our office, however a message was not left.  I do not see record of our office attempting to contact patient.  Nothing further needed at this time.

## 2021-03-03 NOTE — Progress Notes (Signed)
Called and spoke with patient's wife, Misty Stanley, listed on the DPR, provided results per Rubye Oaks NP.  She verbalized understanding.  Patient has a f/u with Dr. Jayme Cloud next week.  Nothing further needed.

## 2021-03-10 NOTE — Progress Notes (Addendum)
Cardiology Office Note:    Date:  03/11/2021   ID:  Curtis Rosales, DOB September 05, 1961, MRN [id number]  PCP:  Curtis Hartigan, MD   [facility] Providers Cardiologist:  Curtis Mocha, MD     Referring MD: Curtis Hartigan, MD   Chief Complaint:  Follow-up (CAD)    Patient Profile:    Curtis Rosales is a 60 y.o. male with:   Coronary artery disease  ? Ant-lat STEMI in 2013 c/b OOH VF arrest>>S/p DES to LAD, POBA to Dx  Ischemic CM ? Echocardiogram 9/13: EF 25-30 ? Echocardiogram 3/14: EF 50-55  Hypertension   Hyperlipidemia   Aortic atherosclerosis (CT 12/2020)  Diabetes mellitus (dx 11/2020)  Hx of COVD-19 ? admx in 11/2020 w hypoxic resp failure 2/2 COVID-19 pneumonia  Prior CV studies: Echocardiogram 02/24/21 EF 50-55, anteroseptal/inferoseptal AK, moderate asymmetric LVH, GR 1 DD, normal RVSF, trivial MR, mild dilation of ascending aorta (39 mm)  CARDIAC TELEMETRY MONITORING-INTERPRETATION ONLY 02/24/2021 Narrative 1. The basic rhythm is normal sinus with an average HR of 84 bpm 2. No atrial fibrillation or flutter 3. No high-grade heart block or pathologic pauses 4. There are occasional PVC's and occasional supraventricular beats without sustained arrhythmias Overall there are no significant arrhythmias identified  GATED SPECT MYO PERF W/EXERCISE STRESS 1D 01/28/2017 Narrative  Pt walked for 8:30 of a Bruce protocol GXt. Peak HR of 153 which is 93% predicted maximal HR  There were no ST or T wave changes to suggest ischemia.  This is a low risk study.  Nuclear stress EF: 54%. The left ventricular ejection fraction is normal (55-65%).  The study is normal.   Echocardiogram 01/03/13 Mild LVH, mild focal basal spetal hypertrophy, EF 50-55, ant-sept HK, Gr 1 DD, mild LAE  LHC (9/13):  pLAD occl, critical stenosis involving D1 bifurcation, pCFX 30%, anterolateral and apcial AK, EF 30% PCI: 3 x 16 mm Promus DES to pLAD and POBA to D1   History of Present  Illness: Curtis Rosales was last seen in 3/22 after a prolonged admission with COVID-19 pneumonia.  I noted he had an episode or AFlutter in the hospital.  I had him wear a Zio monitor that did not show any further AFlutter. an echocardiogram showed normal EF.  He returns for f/u.  He is here today with his wife.  He remains on home O2.  He sees pulmonology soon for reevaluation.  He has not had chest discomfort, syncope, orthopnea, leg edema.  He does get short of breath with some activities.  His breathing is overall stable.        Past Medical History:  Diagnosis Date  . Ascending aorta dilatation ([place])    Echo 4/22: 39 mm // Chest CT 2/22: "nonaneurysmal aorta"  . CAD (coronary artery disease)    a. 07/2012 Anterolateral MI/Cath: LAD 100 100%-> 3.0x16 Promus Element DES, otw nonobs dzs, EF 30%  . Cardiac arrest ([place])   . DM2 (diabetes mellitus, type 2) ([place])   . History of COVID-19    prolonged hospitalization c/b pneumonia, pneumomediastinum  . HLD (hyperlipidemia)   . HTN (hypertension)   . Ischemic cardiomyopathy    a. 07/2012 Echo: EF 25-30% w/ mid ant, basal inf, sept Sev HK & apical, dist post, mid/dist inf, dist ant, dist lat AK. Gr 1 DD, mild MR. // Echo 3/14: EF 50-55 // Echo 4/22: EF 50-55, anteroseptal and inferoseptal HK, moderate asymmetric LVH, GR 1 DD, normal RVSF, trivial MR, mild dilation of ascending aorta (  39 mm)   . MI (myocardial infarction) ([place])   . VF (ventricular fibrillation) ([place])    a. in setting of MI 07/2012    Current Medications: Current Meds  Medication Sig  . acetaminophen (TYLENOL) 500 MG tablet Take 1,000 mg by mouth every 6 (six) hours as needed for mild pain.  Marland Kitchen aspirin EC 81 MG tablet Take 1 tablet (81 mg total) by mouth daily.  . benzonatate (TESSALON) 200 MG capsule Take 1 capsule (200 mg total) by mouth 2 (two) times daily as needed for cough.  . blood glucose meter kit and supplies KIT Dispense based on patient and insurance preference. Use up to four  times daily as directed. (FOR ICD-9 250.00, 250.01).  . carvedilol (COREG) 3.125 MG tablet Take 1 tablet by mouth twice daily  . Multiple Vitamin (MULTIVITAMIN WITH MINERALS) TABS tablet Take 1 tablet by mouth daily.  . nitroGLYCERIN (NITROSTAT) 0.4 MG SL tablet Place 0.4 mg under the tongue every 5 (five) minutes as needed for chest pain.  Marland Kitchen omeprazole (PRILOSEC OTC) 20 MG tablet Take 20 mg by mouth daily.  . pravastatin (PRAVACHOL) 40 MG tablet Take 1 tablet by mouth once daily     Allergies:   Ibuprofen   Social History   Tobacco Use  . Smoking status: Never Smoker  . Smokeless tobacco: Never Used  Vaping Use  . Vaping Use: Never used  Substance Use Topics  . Alcohol use: No  . Drug use: No     Family Hx: The patient's family history includes Heart attack (age of onset: 37) in an other family member; Heart attack (age of onset: 22) in his sister; Heart attack (age of onset: 13) in his father.  ROS - See HPI  EKGs/Labs/Other Test Reviewed:    EKG:  EKG is not ordered today.  The ekg ordered today demonstrates n/a  Recent Labs: 12/15/2020: Magnesium 2.2 12/27/2020: B Natriuretic Peptide 67.0 12/28/2020: ALT 41; BUN 16; Creatinine, Ser 0.79; Hemoglobin 12.1; Platelets 261; Potassium 3.8; Sodium 141   Recent Lipid Panel Lab Results  Component Value Date/Time   CHOL 162 01/15/2021 07:51 AM   TRIG 233 (H) 01/15/2021 07:51 AM   HDL 31 (L) 01/15/2021 07:51 AM   CHOLHDL 5.2 (H) 01/15/2021 07:51 AM   CHOLHDL 4.9 11/15/2015 09:33 AM   LDLCALC 91 01/15/2021 07:51 AM   LDLDIRECT 81.1 09/21/2014 08:34 AM    Labs obtained through care everywhere  - personally reviewed and interpreted: 02/10/2021: Hgb 13.5, K+ 4.3, ALT 16, creatinine 0.8, triglycerides 340, total cholesterol 164, LDL 60, HDL 35.6  Risk Assessment/Calculations:    CHA2DS2-VASc Score = 3  This indicates a 3.2% annual risk of stroke. The patient's score is based upon: CHF History: No HTN History: Yes Diabetes  History: Yes Stroke History: No Vascular Disease History: Yes Age Score: 0 Gender Score: 0     Physical Exam:    VS:  BP 110/62   Pulse 85   Ht 5' 11"  (1.803 m)   Wt 214 lb 3.2 oz (97.2 kg)   SpO2 98%   BMI 29.87 kg/m     Wt Readings from Last 3 Encounters:  03/11/21 214 lb 3.2 oz (97.2 kg)  01/24/21 195 lb (88.5 kg)  01/21/21 194 lb (88 kg)     Constitutional:      Appearance: Healthy appearance. Not in distress.  Neck:     Vascular: JVD normal.  Pulmonary:     Effort: Pulmonary effort is normal.  Breath sounds: No wheezing. No rales.  Cardiovascular:     Normal rate. Regular rhythm. Normal S1. Normal S2.     Murmurs: There is no murmur.  Edema:    Peripheral edema absent.  Abdominal:     Palpations: Abdomen is soft. There is no hepatomegaly.  Skin:    General: Skin is warm and dry.  Neurological:     General: No focal deficit present.     Mental Status: Alert and oriented to person, place and time.     Cranial Nerves: Cranial nerves are intact.         ASSESSMENT & PLAN:    1. Coronary artery disease involving native coronary artery of native heart without angina pectoris History of anterior MI in 2013 treated with DES to the LAD and balloon angioplasty to the diagonal.  His last Myoview was in 2018 and was negative for ischemia.  He is doing well without anginal symptoms.  Recent echocardiogram demonstrated normal LV function.  Continue aspirin, beta-blocker, statin therapy.  Follow-up with Dr. Burt Knack in 6 months.  2. Typical atrial flutter ([place]) Recent monitor demonstrated no recurrent atrial flutter.  Therefore, his atrial flutter was secondary to his acute illness.  He does not require anticoagulation unless he has recurrent atrial flutter in the future.  3. Mixed hyperlipidemia Recent LDL at goal.  Triglycerides are elevated.  I have asked him to try taking fish oil from over-the-counter.  He has labs due in June and we can recheck his triglycerides  at that time.  Continue current dose of pravastatin.  4. Essential hypertension The patient's blood pressure is controlled on his current regimen.  Continue current therapy.      Dispo:  Return in about 6 months (around 09/11/2021) for Routine Follow Up, w/ Dr. Burt Knack.   Medication Adjustments/Labs and Tests Ordered: Current medicines are reviewed at length with the patient today.  Concerns regarding medicines are outlined above.  Tests Ordered: No orders of the defined types were placed in this encounter.  Medication Changes: No orders of the defined types were placed in this encounter.   Signed, Richardson Dopp, PA-C  03/11/2021 5:43 PM    [facility] [place], [place], [place]  [address] Phone: [phone]; Fax: [phone]

## 2021-03-11 ENCOUNTER — Ambulatory Visit (INDEPENDENT_AMBULATORY_CARE_PROVIDER_SITE_OTHER): Payer: 59 | Admitting: Physician Assistant

## 2021-03-11 ENCOUNTER — Encounter: Payer: Self-pay | Admitting: Physician Assistant

## 2021-03-11 ENCOUNTER — Other Ambulatory Visit: Payer: Self-pay

## 2021-03-11 VITALS — BP 110/62 | HR 85 | Ht 71.0 in | Wt 214.2 lb

## 2021-03-11 DIAGNOSIS — I483 Typical atrial flutter: Secondary | ICD-10-CM | POA: Diagnosis not present

## 2021-03-11 DIAGNOSIS — E782 Mixed hyperlipidemia: Secondary | ICD-10-CM

## 2021-03-11 DIAGNOSIS — I10 Essential (primary) hypertension: Secondary | ICD-10-CM

## 2021-03-11 DIAGNOSIS — I2510 Atherosclerotic heart disease of native coronary artery without angina pectoris: Secondary | ICD-10-CM

## 2021-03-11 NOTE — Patient Instructions (Signed)
Medication Instructions:  Try getting some Fish Oil over the counter and take 1000 mg twice daily.  *If you need a refill on your cardiac medications before your next appointment, please call your pharmacy*   Lab Work: Keep your lab appt in June to recheck your cholesterol after taking Fish Oil for a month.   If you have labs (blood work) drawn today and your tests are completely normal, you will receive your results only by: Marland Kitchen MyChart Message (if you have MyChart) OR . A paper copy in the mail If you have any lab test that is abnormal or we need to change your treatment, we will call you to review the results.   Follow-Up: At [facility], you and your health needs are our priority.  As part of our continuing mission to provide you with exceptional heart care, we have created designated Provider Care Teams.  These Care Teams include your primary Cardiologist (physician) and Advanced Practice Providers (APPs -  Physician Assistants and Nurse Practitioners) who all work together to provide you with the care you need, when you need it.  We recommend signing up for the patient portal called "MyChart".  Sign up information is provided on this After Visit Summary.  MyChart is used to connect with patients for Virtual Visits (Telemedicine).  Patients are able to view lab/test results, encounter notes, upcoming appointments, etc.  Non-urgent messages can be sent to your provider as well.   To learn more about what you can do with MyChart, go to [email or web address].    Your next appointment:   6 month(s)  The format for your next appointment:   In Person  Provider:   [name], MD   Other Instructions

## 2021-03-12 ENCOUNTER — Ambulatory Visit (INDEPENDENT_AMBULATORY_CARE_PROVIDER_SITE_OTHER): Payer: 59 | Admitting: Pulmonary Disease

## 2021-03-12 ENCOUNTER — Encounter: Payer: Self-pay | Admitting: Pulmonary Disease

## 2021-03-12 VITALS — BP 116/72 | HR 73 | Temp 97.5°F | Ht 71.0 in | Wt 209.0 lb

## 2021-03-12 DIAGNOSIS — Z8616 Personal history of COVID-19: Secondary | ICD-10-CM

## 2021-03-12 DIAGNOSIS — J9611 Chronic respiratory failure with hypoxia: Secondary | ICD-10-CM

## 2021-03-12 DIAGNOSIS — J8410 Pulmonary fibrosis, unspecified: Secondary | ICD-10-CM | POA: Diagnosis not present

## 2021-03-12 NOTE — Patient Instructions (Signed)
You did well with your walk test today.  You do not need oxygen when you walk.  You maintain oxygen saturations at 95%.  We are going to check an oxygen level when you sleep without the oxygen.  We will see her in follow-up in 3 months time with a lung function test to make sure that you are recovering well.

## 2021-03-12 NOTE — Progress Notes (Signed)
Subjective:    Patient ID: Curtis Rosales, male    DOB: 1961/09/29, 60 y.o.   MRN: [id number] Chief Complaint  Patient presents with   Follow-up    HPI Patient is a 60 year old lifelong never smoker who presents as a follow-up though this is my first time evaluating this patient.  He was initially seen by Dr. Brand Males on 20 December 2020 for post COVID hypoxic respiratory failure.  Ill being weaned off of oxygen at that point.  He had been hospitalized at [facility] in mid to late January for COVID-19 pneumonia and respiratory failure.  He did not require BiPAP or ventilator support.  Pain by our nurse practitioner Rexene Edison on 24 January 2021.  At that time he had been able to decrease his liter flow of oxygen to 2 L/min.  He was treated with a prednisone taper and was referred to pulmonary rehab.  He presents today still on oxygen at 2 L/min but he is tolerating on demand delivery.  He had a function testing performed on 12 February 2021 that showed moderate restriction, severe decrease in ERV consistent with obesity moderate diffusion capacity impairment and mild small airways component.  Cardiogram on 25 April showed LVEF of 50 to 55%, DD grade I trivial mitral regurgitation and mild dilation of the ascending aorta.  Overall he is feeling that he is making progress and feels he has more stamina.  He has not had a cough since his issues with COVID.  No fevers, chills or sweats.  Does not endorse any orthopnea or paroxysmal nocturnal dyspnea.  No lower extremity edema.    Review of Systems A 10 point review of systems was performed and it is as noted above otherwise negative.  Patient Active Problem List   Diagnosis Date Noted   Chronic respiratory failure with hypoxia ([place]) 01/24/2021   Physical deconditioning 01/24/2021   SIRS (systemic inflammatory response syndrome) ([place]) 12/27/2020   Hypotension 12/27/2020   Near syncope 12/27/2020   Sepsis due to pneumonia ([place]) 11/25/2020    Pneumonia due to COVID-19 virus 11/25/2020   Acute respiratory disease due to COVID-19 virus 11/24/2020   Acute on chronic respiratory failure with hypoxia ([place]) 11/24/2020   Hypertriglyceridemia 02/25/2015   Pure hypercholesterolemia 09/14/2014   Acute MI, anterolateral wall, initial episode of care ([place]) 07/12/2012   Cardiomyopathy, ischemic 07/12/2012   CAD (coronary artery disease) 07/12/2012   Ventricular fibrillation ([place]) 07/08/2012   Social History   Tobacco Use   Smoking status: Never   Smokeless tobacco: Never  Substance Use Topics   Alcohol use: No   Allergies  Allergen Reactions   Ibuprofen Other (See Comments)    Heart issue   Current Meds  Medication Sig   acetaminophen (TYLENOL) 500 MG tablet Take 1,000 mg by mouth every 6 (six) hours as needed for mild pain.   aspirin EC 81 MG tablet Take 1 tablet (81 mg total) by mouth daily.   benzonatate (TESSALON) 200 MG capsule Take 1 capsule (200 mg total) by mouth 2 (two) times daily as needed for cough.   blood glucose meter kit and supplies KIT Dispense based on patient and insurance preference. Use up to four times daily as directed. (FOR ICD-9 250.00, 250.01).   Multiple Vitamin (MULTIVITAMIN WITH MINERALS) TABS tablet Take 1 tablet by mouth daily.   nitroGLYCERIN (NITROSTAT) 0.4 MG SL tablet Place 0.4 mg under the tongue every 5 (five) minutes as needed for chest pain.   omeprazole (PRILOSEC OTC)  20 MG tablet Take 20 mg by mouth daily.   [DISCONTINUED] carvedilol (COREG) 3.125 MG tablet Take 1 tablet by mouth twice daily   [DISCONTINUED] pravastatin (PRAVACHOL) 40 MG tablet Take 1 tablet by mouth once daily    There is no immunization history on file for this patient. We discussed Covid-19 precautions.  I reviewed the vaccine effectiveness and potential side effects in detail to include differences between mRNA vaccines and traditional vaccines (attenuated virus).  Discussion also offered of long-term effectiveness and  safety profile which are unclear at this time.  Discussed current CDC guidance that all patients are recommended COVID-19 vaccinations -as long as they do not have allergy to components of the vaccine.    Objective:   Physical Exam BP 116/72 (BP Location: Left Arm, Cuff Size: Normal)   Pulse 73   Temp (!) 97.5 F (36.4 C) (Temporal)   Ht 5' 11"  (1.803 m)   Wt 209 lb (94.8 kg)   SpO2 99%   BMI 29.15 kg/m  GENERAL: Overweight gentleman, no acute distress.  Fully ambulatory.  No conversational dyspnea. HEAD: Normocephalic, atraumatic.  EYES: Pupils equal, round, reactive to light.  No scleral icterus.  MOUTH: Nose/mouth/throat not examined due to masking requirements for COVID 19. NECK: Supple. No thyromegaly. Trachea midline. No JVD.  No adenopathy. PULMONARY: Good air entry bilaterally.  No adventitious sounds. CARDIOVASCULAR: S1 and S2. Regular rate and rhythm.  No rubs, murmurs or gallops heard. ABDOMEN: Obese otherwise benign. MUSCULOSKELETAL: No joint deformity, no clubbing, no edema.  NEUROLOGIC: No focal deficit, no gait disturbance, speech is fluent. SKIN: Intact,warm,dry.  On limited exam no rashes. PSYCH: Mood and behavior normal.  Chest x-ray from 24 January 2021 with mild postinflammatory fibrosis changes:    Ambulatory oximetry performed on room air today: Patient tolerated over 750 feet of ambulation without difficulty.  No shortness of breath reported.  Resting oxygen saturation 99%, nadir was 95%.    Assessment & Plan:     ICD-10-CM   1. 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 Chronic respiratory failure with hypoxia (HCC)  J96.11 Pulse oximetry, overnight   May be off of oxygen during the day Check overnight oximetry on room air    3. History of 2019 novel coronavirus disease (COVID-19)  Z86.16    This issue adds complexity to his  management     Orders Placed This Encounter  Procedures   Pulse oximetry, overnight    On roomair  SVX:[id number]    Standing Status:   Future    Standing Expiration Date:   03/12/2022   Pulmonary Function Test [facility] Only    Standing Status:   Future    Standing Expiration Date:   03/12/2022    Scheduling Instructions:     27mo   Order Specific Question:   Full PFT: includes the following: basic spirometry, spirometry pre & post bronchodilator, diffusion capacity (DLCO), lung volumes    Answer:   Full PFT   Discussion:  The patient will be able to be weaned off of oxygen with his regular activities during the day.  However, to determine need for ongoing oxygen we will evaluate with an overnight oximetry as he may still be having issues with hypoventilation at nighttime in particular due to his restrictive physiology post COVID.  Patient in follow-up in 3 months time he is at that time to  make sure that he is recovering well.  Will determine need for discontinuation of all oxygen sources once results of overnight oximetry are available.  Renold Don, MD [facility]   *This note was dictated using voice recognition software/Dragon.  Despite best efforts to proofread, errors can occur which can change the meaning.  Any change was purely unintentional.

## 2021-04-01 ENCOUNTER — Other Ambulatory Visit: Payer: Self-pay | Admitting: Cardiovascular Disease

## 2021-04-03 ENCOUNTER — Encounter: Payer: Self-pay | Admitting: *Deleted

## 2021-04-07 ENCOUNTER — Telehealth: Payer: Self-pay | Admitting: Pulmonary Disease

## 2021-04-07 DIAGNOSIS — Z9189 Other specified personal risk factors, not elsewhere classified: Secondary | ICD-10-CM

## 2021-04-07 NOTE — Telephone Encounter (Signed)
ONO reviewed by Dr. Fredrich Romans sleep study.  Lm for patient.

## 2021-04-07 NOTE — Telephone Encounter (Signed)
Patient's spouse, Lisa(DPR) is aware of results and voiced her understanding.  Order has been placed for sleep study.  Nothing further needed at this time.

## 2021-04-10 ENCOUNTER — Encounter: Payer: Self-pay | Admitting: Pulmonary Disease

## 2021-04-21 ENCOUNTER — Other Ambulatory Visit: Payer: 59

## 2021-04-21 ENCOUNTER — Other Ambulatory Visit: Payer: Self-pay

## 2021-04-21 DIAGNOSIS — I2510 Atherosclerotic heart disease of native coronary artery without angina pectoris: Secondary | ICD-10-CM

## 2021-04-21 DIAGNOSIS — E782 Mixed hyperlipidemia: Secondary | ICD-10-CM

## 2021-04-21 DIAGNOSIS — Z8616 Personal history of COVID-19: Secondary | ICD-10-CM

## 2021-04-21 DIAGNOSIS — I483 Typical atrial flutter: Secondary | ICD-10-CM

## 2021-04-21 LAB — LIPID PANEL
Chol/HDL Ratio: 5.2 ratio — ABNORMAL HIGH (ref 0.0–5.0)
Cholesterol, Total: 161 mg/dL (ref 100–199)
HDL: 31 mg/dL — ABNORMAL LOW (ref >39)
LDL Chol Calc (NIH): 83 mg/dL (ref 0–99)
Triglycerides: 283 mg/dL — ABNORMAL HIGH (ref 0–149)
VLDL Cholesterol Cal: 47 mg/dL — ABNORMAL HIGH (ref 5–40)

## 2021-04-21 LAB — HEPATIC FUNCTION PANEL
ALT: 22 IU/L (ref 0–44)
AST: 19 IU/L (ref 0–40)
Albumin: 4.4 g/dL (ref 3.8–4.9)
Alkaline Phosphatase: 77 IU/L (ref 44–121)
Bilirubin Total: 0.3 mg/dL (ref 0.0–1.2)
Bilirubin, Direct: <0.1 mg/dL (ref 0.00–0.40)
Total Protein: 6.6 g/dL (ref 6.0–8.5)

## 2021-04-22 ENCOUNTER — Other Ambulatory Visit: Payer: Self-pay | Admitting: *Deleted

## 2021-04-22 DIAGNOSIS — I2510 Atherosclerotic heart disease of native coronary artery without angina pectoris: Secondary | ICD-10-CM

## 2021-04-22 DIAGNOSIS — E782 Mixed hyperlipidemia: Secondary | ICD-10-CM

## 2021-04-22 MED ORDER — ROSUVASTATIN CALCIUM 20 MG PO TABS: 20.0000 mg | ORAL_TABLET | Freq: Every day | ORAL | 3 refills | Status: DC

## 2021-04-23 ENCOUNTER — Telehealth: Payer: Self-pay | Admitting: Pulmonary Disease

## 2021-04-23 NOTE — Telephone Encounter (Signed)
Received record request from disability determination service. Request has been faxed to medical records.

## 2021-04-24 NOTE — Telephone Encounter (Signed)
Dr. Gonzalez, please advise. Thanks 

## 2021-04-24 NOTE — Telephone Encounter (Signed)
They should monitor with oximetry.  Make sure saturations remain at 88% or better.  Use Mucinex DM extra strength twice a day for cough.  Drink plenty of fluids.  If he develops increasing shortness of breath or chest discomfort then he needs to be seen in the emergency room.

## 2021-05-19 ENCOUNTER — Other Ambulatory Visit: Payer: 59

## 2021-05-20 ENCOUNTER — Ambulatory Visit: Payer: 59

## 2021-06-02 ENCOUNTER — Telehealth: Payer: Self-pay

## 2021-06-02 NOTE — Telephone Encounter (Signed)
Called and spoke to patients wife (on [place]) and reminded them of pt's upcoming Covid test on Wednesday. Nothing further needed.

## 2021-06-04 ENCOUNTER — Other Ambulatory Visit: Payer: Self-pay

## 2021-06-04 ENCOUNTER — Other Ambulatory Visit
Admission: RE | Admit: 2021-06-04 | Discharge: 2021-06-04 | Disposition: A | Discharge status: Home / Self Care | Payer: 59 | Source: Ambulatory Visit | Attending: Pulmonary Disease | Admitting: Pulmonary Disease

## 2021-06-04 DIAGNOSIS — Z01812 Encounter for preprocedural laboratory examination: Secondary | ICD-10-CM | POA: Insufficient documentation

## 2021-06-04 DIAGNOSIS — Z20822 Contact with and (suspected) exposure to covid-19: Secondary | ICD-10-CM | POA: Diagnosis not present

## 2021-06-04 LAB — SARS CORONAVIRUS 2 (TAT 6-24 HRS): SARS Coronavirus 2: NEGATIVE

## 2021-06-05 ENCOUNTER — Ambulatory Visit: Payer: 59 | Attending: Pulmonary Disease

## 2021-06-05 DIAGNOSIS — J8410 Pulmonary fibrosis, unspecified: Secondary | ICD-10-CM | POA: Insufficient documentation

## 2021-06-05 DIAGNOSIS — U099 Post covid-19 condition, unspecified: Secondary | ICD-10-CM | POA: Diagnosis not present

## 2021-06-05 MED ORDER — ALBUTEROL SULFATE (2.5 MG/3ML) 0.083% IN NEBU
2.5000 mg | INHALATION_SOLUTION | Freq: Once | RESPIRATORY_TRACT | Status: AC
  Administered 2021-06-05: 2.5 mg via RESPIRATORY_TRACT
  Filled 2021-06-05: qty 3

## 2021-06-25 MED ORDER — BUDESONIDE-FORMOTEROL FUMARATE 160-4.5 MCG/ACT IN AERO: 2.0000 | INHALATION_SPRAY | Freq: Two times a day (BID) | RESPIRATORY_TRACT | 6 refills | Status: DC

## 2021-06-25 MED ORDER — BENZONATATE 200 MG PO CAPS: 200.0000 mg | ORAL_CAPSULE | ORAL | 0 refills | Status: DC | PRN

## 2021-06-25 MED ORDER — MOLNUPIRAVIR 200 MG PO CAPS: 800.0000 mg | ORAL_CAPSULE | Freq: Every day | ORAL | 0 refills | Status: DC

## 2021-06-25 NOTE — Telephone Encounter (Signed)
Dr. Gonzalez, please advise. Thanks 

## 2021-06-25 NOTE — Telephone Encounter (Signed)
Lets call in molnupiravir 800 mg (4 x 200 mg capsules) daily for 5 days.  Call in some Symbicort 160/4.5, 2 inhalations twice a day and he can have some Tessalon Perles 200 mg every 4 as needed for cough.

## 2021-06-25 NOTE — Telephone Encounter (Signed)
Spoke to patient's spouse, Lisa(DPR) and relayed below recommendations.  Rx has been sent to preferred pharmacy. Nothing further needed at this time.

## 2021-07-04 ENCOUNTER — Other Ambulatory Visit: Payer: Self-pay | Admitting: Cardiovascular Disease

## 2021-07-14 ENCOUNTER — Other Ambulatory Visit: Payer: 59

## 2021-07-21 ENCOUNTER — Other Ambulatory Visit: Payer: 59 | Admitting: *Deleted

## 2021-07-21 ENCOUNTER — Other Ambulatory Visit: Payer: Self-pay

## 2021-07-21 DIAGNOSIS — I2510 Atherosclerotic heart disease of native coronary artery without angina pectoris: Secondary | ICD-10-CM

## 2021-07-21 DIAGNOSIS — E782 Mixed hyperlipidemia: Secondary | ICD-10-CM

## 2021-07-21 LAB — HEPATIC FUNCTION PANEL
ALT: 17 IU/L (ref 0–44)
AST: 17 IU/L (ref 0–40)
Albumin: 4.7 g/dL (ref 3.8–4.9)
Alkaline Phosphatase: 85 IU/L (ref 44–121)
Bilirubin Total: 0.4 mg/dL (ref 0.0–1.2)
Bilirubin, Direct: 0.13 mg/dL (ref 0.00–0.40)
Total Protein: 6.6 g/dL (ref 6.0–8.5)

## 2021-07-21 LAB — LIPID PANEL
Chol/HDL Ratio: 4.4 ratio (ref 0.0–5.0)
Cholesterol, Total: 127 mg/dL (ref 100–199)
HDL: 29 mg/dL — ABNORMAL LOW (ref >39)
LDL Chol Calc (NIH): 49 mg/dL (ref 0–99)
Triglycerides: 318 mg/dL — ABNORMAL HIGH (ref 0–149)
VLDL Cholesterol Cal: 49 mg/dL — ABNORMAL HIGH (ref 5–40)

## 2021-07-25 ENCOUNTER — Other Ambulatory Visit: Payer: Self-pay | Admitting: *Deleted

## 2021-07-25 DIAGNOSIS — E781 Pure hyperglyceridemia: Secondary | ICD-10-CM

## 2021-07-25 DIAGNOSIS — E7800 Pure hypercholesterolemia, unspecified: Secondary | ICD-10-CM

## 2021-07-25 MED ORDER — ICOSAPENT ETHYL 1 G PO CAPS: 2.0000 g | ORAL_CAPSULE | Freq: Two times a day (BID) | ORAL | 11 refills | Status: DC

## 2021-08-14 ENCOUNTER — Telehealth: Payer: Self-pay

## 2021-08-14 NOTE — Telephone Encounter (Signed)
Pt's wife calling requesting a refill on icosapent ethyl (vascepa) 1 g capsule. I explained to the wife that this medication has already been seen to pt's pharmacy. I called the pharmacy and the pharmacy tech stated that pt needed a prior auth done, for pt to be able to get his medication. Larita Fife, LPN, can you please address this matter? Thanks

## 2021-08-15 NOTE — Telephone Encounter (Signed)
**Note De-Identified Curtis Rosales Obfuscation** I started a Icosapent PA through covermymeds. Key: [id number]

## 2021-08-20 MED ORDER — ICOSAPENT ETHYL 1 G PO CAPS: 2.0000 g | ORAL_CAPSULE | Freq: Two times a day (BID) | ORAL | 11 refills | Status: DC

## 2021-08-20 NOTE — Telephone Encounter (Signed)
PA approved through 08/14/22, refill sent in.

## 2021-10-07 ENCOUNTER — Telehealth: Payer: Self-pay | Admitting: Cardiovascular Disease

## 2021-10-07 NOTE — Telephone Encounter (Signed)
Pt c/o of Chest Pain: STAT if CP now or developed within 24 hours  1. Are you having CP right now? NO, PT IS NOT HAVING CHEST PRESSURE RIGHT NOW BUT EVERY NOW AND THEN HE WILL TAKE HIS FINGERS AND TAP ON HIS CHEST WHEN HE FEELS THE PRESSURE.  2. Are you experiencing any other symptoms (ex. SOB, nausea, vomiting, sweating)? PT HAS LUNG DISEASE SO HE  IS ALWAYS  SOB  3. How long have you been experiencing CP? SINCE July 2022  4. Is your CP continuous or coming and going? COMING AND GOING  5. Have you taken Nitroglycerin? NO ?

## 2021-10-07 NOTE — Telephone Encounter (Signed)
The patient's wife (DPR) said that he has been having chest symptoms more often. He has been having chest pressure since July 2022 that comes and goes. No nausea, vomiting, diaphoresis associated. Always has dyspnea related to lung disease, however no change noted. They have not called in before today. Misty Stanley called in today because she keeps seeing her husband tap his chest since last week and has been asking him about it. He said it is just different than it has been and he does not feel right. He has not used his Nitro. BP 138/87  HR 89 on 10/07/21 Set up DOD appointment for tomorrow 12/7. Advised wife to have the patient use Nitro SL, if the chest pressure comes again along with ED precautions. Went over use of nitro instructions with her. Verbalized understanding and read back for appt date and time.

## 2021-10-08 ENCOUNTER — Ambulatory Visit: Payer: 59 | Admitting: Cardiovascular Disease

## 2021-10-08 ENCOUNTER — Encounter: Payer: Self-pay | Admitting: Cardiovascular Disease

## 2021-10-08 ENCOUNTER — Other Ambulatory Visit: Payer: Self-pay

## 2021-10-08 VITALS — BP 120/82 | HR 81 | Ht 71.0 in | Wt 217.8 lb

## 2021-10-08 DIAGNOSIS — R079 Chest pain, unspecified: Secondary | ICD-10-CM

## 2021-10-08 DIAGNOSIS — I2510 Atherosclerotic heart disease of native coronary artery without angina pectoris: Secondary | ICD-10-CM | POA: Diagnosis not present

## 2021-10-08 MED ORDER — NITROGLYCERIN 0.4 MG SL SUBL: 0.4000 mg | SUBLINGUAL_TABLET | SUBLINGUAL | 3 refills | Status: DC | PRN

## 2021-10-08 NOTE — Patient Instructions (Signed)
Medication Instructions:  NO CHANGES *If you need a refill on your cardiac medications before your next appointment, please call your pharmacy*   Lab Work: NONE If you have labs (blood work) drawn today and your tests are completely normal, you will receive your results only by: MyChart Message (if you have MyChart) OR A paper copy in the mail If you have any lab test that is abnormal or we need to change your treatment, we will call you to review the results.   Testing/Procedures: NONE   Follow-Up: At [facility], you and your health needs are our priority.  As part of our continuing mission to provide you with exceptional heart care, we have created designated Provider Care Teams.  These Care Teams include your primary Cardiologist (physician) and Advanced Practice Providers (APPs -  Physician Assistants and Nurse Practitioners) who all work together to provide you with the care you need, when you need it.  We recommend signing up for the patient portal called "MyChart".  Sign up information is provided on this After Visit Summary.  MyChart is used to connect with patients for Virtual Visits (Telemedicine).  Patients are able to view lab/test results, encounter notes, upcoming appointments, etc.  Non-urgent messages can be sent to your provider as well.   To learn more about what you can do with MyChart, go to [email or web address].    Your next appointment:   KEEP UPCOMING APPOINTMENT WITH DR Excell Seltzer  The format for your next appointment:   In Person

## 2021-10-08 NOTE — Progress Notes (Signed)
Cardiology Office Note:    Date:  10/08/2021   ID:  Curtis Rosales, DOB 1961/02/12, MRN [id number]  PCP:  Marina Goodell, MD   [facility] Providers Cardiologist:  [name], MD {    Referring MD: Marina Goodell, MD   Chief Complaint  Patient presents with   Chest Pain     History of Present Illness:    Curtis Rosales is a 60 y.o. male with a hx of coronary artery disease, diabetes mellitus, dilated ascending aorta, hypertension, hyperlipidemia  He is seen today as a DOD work in visit for episodes of chest pain.  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   Walks during the course of his day , no real cardio exercise Wife does  a good job of limiting his fats,     Past Medical History:  Diagnosis Date   Ascending aorta dilatation (HCC)    Echo 4/22: 39 mm // Chest CT 2/22: "nonaneurysmal aorta"   CAD (coronary artery disease)    a. 07/2012 Anterolateral MI/Cath: LAD 100 100%-> 3.0x16 Promus Element DES, otw nonobs dzs, EF 30%   Cardiac arrest (HCC)    DM2 (diabetes mellitus, type 2) (HCC)    History of COVID-19    prolonged hospitalization c/b pneumonia, pneumomediastinum   HLD (hyperlipidemia)    HTN (hypertension)    Ischemic cardiomyopathy    a. 07/2012 Echo: EF 25-30% w/ mid ant, basal inf, sept Sev HK & apical, dist post, mid/dist inf, dist ant, dist lat AK. Gr 1 DD, mild MR. // Echo 3/14: EF 50-55 // Echo 4/22: EF 50-55, anteroseptal and inferoseptal HK, moderate asymmetric LVH, GR 1 DD, normal RVSF, trivial MR, mild dilation of ascending aorta (39 mm)    MI (myocardial infarction) (HCC)    VF (ventricular fibrillation) (HCC)    a. in setting of MI 07/2012     Past Surgical History:  Procedure Laterality Date   APPENDECTOMY     CARDIAC CATHETERIZATION     LEFT HEART CATHETERIZATION WITH CORONARY ANGIOGRAM N/A 07/08/2012   Procedure: LEFT HEART CATHETERIZATION WITH CORONARY ANGIOGRAM;  Surgeon: [name], MD;  Location: [facility] CATH LAB;  Service: Cardiovascular;  Laterality: N/A;   PERCUTANEOUS CORONARY STENT INTERVENTION (PCI-S)  07/08/2012   Procedure: PERCUTANEOUS CORONARY STENT INTERVENTION (PCI-S);  Surgeon: [name], MD;  Location: [facility] CATH LAB;  Service: Cardiovascular;;    Current Medications: Current Meds  Medication Sig   acetaminophen (TYLENOL) 500 MG tablet Take 1,000 mg by mouth every 6 (six) hours as needed for mild pain.   albuterol (VENTOLIN HFA) 108 (90 Base) MCG/ACT inhaler Inhale 2 puffs into the lungs every 4 (four) hours as needed for shortness of breath or wheezing.   aspirin EC 81 MG tablet Take 1 tablet (81 mg total) by mouth daily.   budesonide-formoterol (SYMBICORT) 160-4.5 MCG/ACT inhaler Inhale 2 puffs into the lungs 2 (two) times daily.   carvedilol (COREG) 3.125 MG tablet Take 1 tablet by mouth twice daily   icosapent Ethyl (VASCEPA) 1 g capsule Take 2 capsules (2 g total) by mouth 2 (two) times  daily.   Molnupiravir 200 MG CAPS Take 4 capsules (800 mg total) by mouth daily.   Multiple Vitamin (MULTIVITAMIN WITH MINERALS) TABS tablet Take 1 tablet by mouth daily.   omeprazole (PRILOSEC OTC) 20 MG tablet Take 20 mg by mouth daily.   [DISCONTINUED] nitroGLYCERIN (NITROSTAT) 0.4 MG SL tablet Place 0.4 mg under the tongue every 5 (five) minutes as needed for chest pain.     Allergies:   Ibuprofen   Social History   Socioeconomic History   Marital status: Married    Spouse name: Not on file   Number of children: Not on file   Years of education: Not on file   Highest education level: Not on file  Occupational History   Occupation: Journalist, newspaper  Tobacco Use   Smoking status: Never   Smokeless tobacco:  Never  Vaping Use   Vaping Use: Never used  Substance and Sexual Activity   Alcohol use: No   Drug use: No   Sexual activity: Not on file    Comment: did not ask  Other Topics Concern   Not on file  Social History Narrative   He is not able to remember any of his past medical history, other than  He had an appendectomy. He cannot remember any medicines he takes or any conditions for which he receives treatment. He also cannot remember the name of his physician. Patient lives with wife in [place] and drives racecars for a hobby.   Social Determinants of Health   Financial Resource Strain: Not on file  Food Insecurity: Not on file  Transportation Needs: Not on file  Physical Activity: Not on file  Stress: Not on file  Social Connections: Not on file     Family History: The patient's family history includes Heart attack (age of onset: 89) in an other family member; Heart attack (age of onset: 69) in his sister; Heart attack (age of onset: 27) in his father.  ROS:   Please see the history of present illness.     All other systems reviewed and are negative.  EKGs/Labs/Other Studies Reviewed:    The following studies were reviewed today:   EKG:   October 08, 2021: Normal sinus rhythm at 81.  No ST or T wave changes.  Recent Labs: 12/15/2020: Magnesium 2.2 12/27/2020: B Natriuretic Peptide 67.0 12/28/2020: BUN 16; Creatinine, Ser 0.79; Hemoglobin 12.1; Platelets 261; Potassium 3.8; Sodium 141 07/21/2021: ALT 17  Recent Lipid Panel    Component Value Date/Time   CHOL 127 07/21/2021 1048   TRIG 318 (H) 07/21/2021 1048   HDL 29 (L) 07/21/2021 1048   CHOLHDL 4.4 07/21/2021 1048   CHOLHDL 4.9 11/15/2015 0933   VLDL 44 (H) 11/15/2015 0933   LDLCALC 49 07/21/2021 1048   LDLDIRECT 81.1 09/21/2014 0834     Risk Assessment/Calculations:           Physical Exam:    VS:  BP 120/82 (BP Location: Left Arm, Patient Position: Sitting, Cuff Size: Normal)   Pulse 81   Ht 5\' 11"   (1.803 m)   Wt 217 lb 12.8 oz (98.8 kg)   SpO2 97%   BMI 30.38 kg/m     Wt Readings from Last 3 Encounters:  10/08/21 217 lb 12.8 oz (98.8 kg)  03/12/21 209 lb (94.8 kg)  03/11/21 214 lb 3.2 oz (97.2 kg)     GEN:  Well nourished, well developed in no acute distress HEENT: Normal NECK: No JVD; No carotid bruits LYMPHATICS: No  lymphadenopathy CARDIAC: RRR, no murmurs, rubs, gallops RESPIRATORY:  Clear to auscultation without rales, wheezing or rhonchi  ABDOMEN: Soft, non-tender, non-distended MUSCULOSKELETAL:  No edema; No deformity  SKIN: Warm and dry NEUROLOGIC:  Alert and oriented x 3 PSYCHIATRIC:  Normal affect   ASSESSMENT:    1. Chest pain, unspecified type   2. Coronary artery disease involving native coronary artery without angina pectoris, unspecified whether native or transplanted heart    PLAN:     Chest pain : He had a rather atypical episode of chest pain that occurred when he was bending over to pick something up.  The pain only lasted for a split second and did not return.  He is EKG shows no abnormalities. At this point I think this is probably a musculoskeletal issue.  He does not exercise on a regular basis.  I have advised him to exercise regularly and to make note of if he has any episodes of chest discomfort with exercise.  The pain did not last for any length of time.  There was no ripping or tearing sensation.  It does not sound like an aortic dissection.  He is got an appointment to see Dr. Excell Seltzer in a month.  He will keep that appointment.      2.  Coronary artery disease: Status post stenting in the past      Medication Adjustments/Labs and Tests Ordered: Current medicines are reviewed at length with the patient today.  Concerns regarding medicines are outlined above.  Orders Placed This Encounter  Procedures   EKG 12-Lead   Meds ordered this encounter  Medications   nitroGLYCERIN (NITROSTAT) 0.4 MG SL tablet    Sig: Place 1 tablet (0.4  mg total) under the tongue every 5 (five) minutes as needed for chest pain.    Dispense:  25 tablet    Refill:  3    Patient Instructions  Medication Instructions:  NO CHANGES *If you need a refill on your cardiac medications before your next appointment, please call your pharmacy*   Lab Work: NONE If you have labs (blood work) drawn today and your tests are completely normal, you will receive your results only by: MyChart Message (if you have MyChart) OR A paper copy in the mail If you have any lab test that is abnormal or we need to change your treatment, we will call you to review the results.   Testing/Procedures: NONE   Follow-Up: At [facility], you and your health needs are our priority.  As part of our continuing mission to provide you with exceptional heart care, we have created designated Provider Care Teams.  These Care Teams include your primary Cardiologist (physician) and Advanced Practice Providers (APPs -  Physician Assistants and Nurse Practitioners) who all work together to provide you with the care you need, when you need it.  We recommend signing up for the patient portal called "MyChart".  Sign up information is provided on this After Visit Summary.  MyChart is used to connect with patients for Virtual Visits (Telemedicine).  Patients are able to view lab/test results, encounter notes, upcoming appointments, etc.  Non-urgent messages can be sent to your provider as well.   To learn more about what you can do with MyChart, go to [email or web address].    Your next appointment:   KEEP UPCOMING APPOINTMENT WITH DR Excell Seltzer  The format for your next appointment:   In Person      Signed, Kristeen Miss, MD  10/08/2021 5:29 PM  [facility]

## 2021-11-05 ENCOUNTER — Other Ambulatory Visit: Payer: Self-pay

## 2021-11-05 ENCOUNTER — Ambulatory Visit: Payer: Managed Care, Other (non HMO) | Admitting: Cardiovascular Disease

## 2021-11-05 ENCOUNTER — Telehealth (HOSPITAL_COMMUNITY): Payer: Self-pay | Admitting: *Deleted

## 2021-11-05 ENCOUNTER — Encounter: Payer: Self-pay | Admitting: Cardiovascular Disease

## 2021-11-05 VITALS — BP 120/84 | HR 77 | Ht 71.0 in | Wt 213.4 lb

## 2021-11-05 DIAGNOSIS — I25118 Atherosclerotic heart disease of native coronary artery with other forms of angina pectoris: Secondary | ICD-10-CM | POA: Diagnosis not present

## 2021-11-05 DIAGNOSIS — I10 Essential (primary) hypertension: Secondary | ICD-10-CM | POA: Diagnosis not present

## 2021-11-05 DIAGNOSIS — E782 Mixed hyperlipidemia: Secondary | ICD-10-CM

## 2021-11-05 MED ORDER — ICOSAPENT ETHYL 1 G PO CAPS: 2.0000 g | ORAL_CAPSULE | Freq: Two times a day (BID) | ORAL | 11 refills | Status: DC

## 2021-11-05 NOTE — H&P (View-Only) (Signed)
Cardiology Office Note:    Date:  11/05/2021   ID:  Curtis Rosales, DOB 1961-06-10, MRN [id number]  PCP:  Sofie Hartigan, MD   [facility] Providers Cardiologist:  Sherren Mocha, MD     Referring MD: Sofie Hartigan, MD   Chief Complaint  Patient presents with   Coronary Artery Disease    History of Present Illness:    Curtis Rosales is a 61 y.o. male with a hx of: Coronary artery disease  [place] STEMI in 2013 c/b OOH VF arrest>>S/p DES to LAD, POBA to Dx Ischemic CM Echocardiogram 9/13: EF 25-30 Echocardiogram 3/14: EF 50-55 Hypertension  Hyperlipidemia  Aortic atherosclerosis (CT 12/2020) Diabetes mellitus (dx 11/2020) Hx of COVD-19 admx in 11/2020 w hypoxic resp failure 2/2 COVID-19 pneumonia  The patient is here with his wife today.  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  He felt like something was pushing on his chest and he is not sure if it was related to the stress of driving, anxiety, or physical exertion involved.  Symptoms resolved after a period of rest.  He had no associated shortness of breath, diaphoresis, or nausea.  He has had no other episodes of chest discomfort.  He denies shortness of breath, edema, heart palpitations, orthopnea, or PND.  The patient is compliant with his medications.  Past Medical History:  Diagnosis Date   Ascending aorta dilatation ([place])    Echo 4/22: 39 mm // Chest CT 2/22: "nonaneurysmal aorta"   CAD (coronary artery disease)    a. 07/2012 Anterolateral MI/Cath: LAD 100 100%-> 3.0x16 Promus Element DES, otw nonobs dzs, EF 30%   Cardiac arrest ([place])    DM2 (diabetes mellitus, type 2) ([place])    History of COVID-19    prolonged hospitalization c/b pneumonia, pneumomediastinum   HLD (hyperlipidemia)    HTN (hypertension)    Ischemic cardiomyopathy    a. 07/2012 Echo: EF 25-30% w/ mid ant, basal inf, sept Sev HK & apical, dist post, mid/dist inf, dist ant, dist lat AK. Gr 1 DD, mild MR.  // Echo 3/14: EF 50-55 // Echo 4/22: EF 50-55, anteroseptal and inferoseptal HK, moderate asymmetric LVH, GR 1 DD, normal RVSF, trivial MR, mild dilation of ascending aorta (39 mm)    MI (myocardial infarction) (HCC)    VF (ventricular fibrillation) ([place])    a. in setting of MI 07/2012    Past Surgical History:  Procedure Laterality Date   [place] N/A 07/08/2012   Procedure: LEFT HEART CATHETERIZATION WITH CORONARY ANGIOGRAM;  Surgeon: Sherren Mocha, MD;  Location: [facility] CATH LAB;  Service: Cardiovascular;  Laterality: N/A;   PERCUTANEOUS CORONARY STENT INTERVENTION (PCI-S)  07/08/2012   Procedure: PERCUTANEOUS CORONARY STENT INTERVENTION (PCI-S);  Surgeon: Sherren Mocha, MD;  Location: [facility] CATH LAB;  Service: Cardiovascular;;    Current Medications: Current Meds  Medication Sig   acetaminophen (TYLENOL) 500 MG tablet Take 1,000 mg by mouth every 6 (six) hours as needed for mild pain.   albuterol (VENTOLIN HFA) 108 (90 Base) MCG/ACT inhaler Inhale 2 puffs into the lungs every 4 (four) hours as needed for shortness of breath or wheezing.   aspirin EC 81 MG tablet Take 1 tablet (81 mg total) by mouth daily.   budesonide-formoterol (SYMBICORT) 160-4.5 MCG/ACT inhaler Inhale 2 puffs into the lungs 2 (two) times daily.   carvedilol (COREG) 3.125  MG tablet Take 1 tablet by mouth twice daily   Multiple Vitamin (MULTIVITAMIN WITH MINERALS) TABS tablet Take 1 tablet by mouth daily.   nitroGLYCERIN (NITROSTAT) 0.4 MG SL tablet Place 1 tablet (0.4 mg total) under the tongue every 5 (five) minutes as needed for chest pain.   omeprazole (PRILOSEC OTC) 20 MG tablet Take 20 mg by mouth daily.   rosuvastatin (CRESTOR) 20 MG tablet Take 1 tablet (20 mg total) by mouth daily.   [DISCONTINUED] icosapent Ethyl (VASCEPA) 1 g capsule Take 2 capsules (2 g total) by mouth 2 (two) times daily.     Allergies:   Ibuprofen   Social  History   Socioeconomic History   Marital status: Married    Spouse name: Not on file   Number of children: Not on file   Years of education: Not on file   Highest education level: Not on file  Occupational History   Occupation: Cabin crew  Tobacco Use   Smoking status: Never   Smokeless tobacco: Never  Vaping Use   Vaping Use: Never used  Substance and Sexual Activity   Alcohol use: No   Drug use: No   Sexual activity: Not on file    Comment: did not ask  Other Topics Concern   Not on file  Social History Narrative   He is not able to remember any of his past medical history, other than  He had an appendectomy. He cannot remember any medicines he takes or any conditions for which he receives treatment. He also cannot remember the name of his physician. Patient lives with wife in [place] and drives racecars for a hobby.   Social Determinants of Health   Financial Resource Strain: Not on file  Food Insecurity: Not on file  Transportation Needs: Not on file  Physical Activity: Not on file  Stress: Not on file  Social Connections: Not on file     Family History: The patient's family history includes Heart attack (age of onset: 29) in an other family member; Heart attack (age of onset: 27) in his sister; Heart attack (age of onset: 102) in his father.  ROS:   Please see the history of present illness.    All other systems reviewed and are negative.  EKGs/Labs/Other Studies Reviewed:    The following studies were reviewed today: Echocardiogram 02/24/2021:  1. Left ventricular ejection fraction, by estimation, is 50 to 55%. The  left ventricle has normal function. The left ventricle demonstrates  regional wall motion abnormalities (see scoring diagram/findings for  description). Focal akinesis of mid  anteroseptal/inferoseptal walls with overall normal systolic function.  There is moderate asymmetric left ventricular hypertrophy of the  basal-septal segment. Left  ventricular diastolic parameters are consistent  with Grade I diastolic dysfunction  (impaired relaxation).   2. Right ventricular systolic function is normal. The right ventricular  size is normal. Tricuspid regurgitation signal is inadequate for assessing  PA pressure.   3. The mitral valve is normal in structure. Trivial mitral valve  regurgitation.   4. The aortic valve is tricuspid. Aortic valve regurgitation is not  visualized. No aortic stenosis is present.   5. Aortic dilatation noted. There is mild dilatation of the ascending  aorta, measuring 39 mm.    Exercise stress Myoview scan 01/28/2017: Pt walked for 8:30 of a Bruce protocol GXt. Peak HR of 153 which is 93% predicted maximal HR There were no ST or T wave changes to suggest ischemia. This is a low risk  study. Nuclear stress EF: 54%. The left ventricular ejection fraction is normal (55-65%). The study is normal.  Event monitor 02/24/21: The basic rhythm is normal sinus with an average HR of 84 bpm No atrial fibrillation or flutter No high-grade heart block or pathologic pauses There are occasional PVC's and occasional supraventricular beats without sustained arrhythmias   Overall there are no significant arrhythmias identified  EKG:  EKG is not ordered today.    Recent Labs: 12/15/2020: Magnesium 2.2 12/27/2020: B Natriuretic Peptide 67.0 12/28/2020: BUN 16; Creatinine, Ser 0.79; Hemoglobin 12.1; Platelets 261; Potassium 3.8; Sodium 141 07/21/2021: ALT 17  Recent Lipid Panel    Component Value Date/Time   CHOL 127 07/21/2021 1048   TRIG 318 (H) 07/21/2021 1048   HDL 29 (L) 07/21/2021 1048   CHOLHDL 4.4 07/21/2021 1048   CHOLHDL 4.9 11/15/2015 0933   VLDL 44 (H) 11/15/2015 0933   LDLCALC 49 07/21/2021 1048   LDLDIRECT 81.1 09/21/2014 0834     Risk Assessment/Calculations:           Physical Exam:    VS:  BP 120/84    Pulse 77    Ht 5\' 11"  (1.803 m)    Wt 213 lb 6.4 oz (96.8 kg)    SpO2 96%    BMI 29.76  kg/m     Wt Readings from Last 3 Encounters:  11/05/21 213 lb 6.4 oz (96.8 kg)  10/08/21 217 lb 12.8 oz (98.8 kg)  03/12/21 209 lb (94.8 kg)     GEN:  Well nourished, well developed in no acute distress HEENT: Normal NECK: No JVD; No carotid bruits LYMPHATICS: No lymphadenopathy CARDIAC: RRR, no murmurs, rubs, gallops RESPIRATORY:  Clear to auscultation without rales, wheezing or rhonchi  ABDOMEN: Soft, non-tender, non-distended MUSCULOSKELETAL:  No edema; No deformity  SKIN: Warm and dry NEUROLOGIC:  Alert and oriented x 3 PSYCHIATRIC:  Normal affect   ASSESSMENT:    1. Coronary artery disease involving native coronary artery of native heart with other form of angina pectoris ([place])   2. Mixed hyperlipidemia   3. Essential hypertension    PLAN:    In order of problems listed above:  The patient has had 2 episodes concerning for angina.  With his known history of CAD and old MI, I have recommended a stress Myoview scan for further evaluation.  He will continue on aspirin for antiplatelet therapy, low-dose beta-blocker with carvedilol, and rosuvastatin 20 mg daily. Blood pressure is controlled on low-dose carvedilol. Lipids are reviewed.  LDL cholesterol is 49 mg/dL, total cholesterol 127.  He will continue on Crestor 20 mg daily.  His triglycerides are elevated at 318.  I discussed lifestyle modification with him at length.  He understands the need to reduce simple starches, sugars, and fatty foods.  He will work on dietary modification.      Shared Decision Making/Informed Consent The risks [chest pain, shortness of breath, cardiac arrhythmias, dizziness, blood pressure fluctuations, myocardial infarction, stroke/transient ischemic attack, nausea, vomiting, allergic reaction, radiation exposure, metallic taste sensation and life-threatening complications (estimated to be 1 in 10,000)], benefits (risk stratification, diagnosing coronary artery disease, treatment guidance) and  alternatives of a nuclear stress test were discussed in detail with Mr. Bagent and he agrees to proceed.    Medication Adjustments/Labs and Tests Ordered: Current medicines are reviewed at length with the patient today.  Concerns regarding medicines are outlined above.  Orders Placed This Encounter  Procedures   MYOCARDIAL PERFUSION IMAGING   Meds ordered this  encounter  Medications   icosapent Ethyl (VASCEPA) 1 g capsule    Sig: Take 2 capsules (2 g total) by mouth 2 (two) times daily.    Dispense:  120 capsule    Refill:  11    Patient Instructions  Medication Instructions:  Your physician recommends that you continue on your current medications as directed. Please refer to the Current Medication list given to you today.  *If you need a refill on your cardiac medications before your next appointment, please call your pharmacy*  Testing/Procedures: Your physician has requested that you have en exercise stress myoview. For further information please visit [email or web address]. Please follow instruction sheet, as given.  Follow-Up: At [facility], you and your health needs are our priority.  As part of our continuing mission to provide you with exceptional heart care, we have created designated Provider Care Teams.  These Care Teams include your primary Cardiologist (physician) and Advanced Practice Providers (APPs -  Physician Assistants and Nurse Practitioners) who all work together to provide you with the care you need, when you need it.  Your next appointment:   1 year(s)  The format for your next appointment:   In Person  Provider:   Sherren Mocha, MD       Signed, Sherren Mocha, MD  11/05/2021 8:44 AM    [place]

## 2021-11-05 NOTE — Telephone Encounter (Signed)
Patient's wife given detailed instructions per Myocardial Perfusion Study Information Sheet for the test on 11/10/2021 at 10:15. Patient notified to arrive 15 minutes early and that it is imperative to arrive on time for appointment to keep from having the test rescheduled.  If you need to cancel or reschedule your appointment, please call the office within 24 hours of your appointment. . Patient's wife verbalized understanding.Curtis Rosales

## 2021-11-05 NOTE — Progress Notes (Signed)
Cardiology Office Note:    Date:  11/05/2021   ID:  Curtis Rosales, DOB 1961/10/04, MRN [id number]  PCP:  Sofie Hartigan, MD   [facility] Providers Cardiologist:  Sherren Mocha, MD     Referring MD: Sofie Hartigan, MD   Chief Complaint  Patient presents with   Coronary Artery Disease    History of Present Illness:    Curtis Rosales is a 61 y.o. male with a hx of: Coronary artery disease  [place] STEMI in 2013 c/b OOH VF arrest>>S/p DES to LAD, POBA to Dx Ischemic CM Echocardiogram 9/13: EF 25-30 Echocardiogram 3/14: EF 50-55 Hypertension  Hyperlipidemia  Aortic atherosclerosis (CT 12/2020) Diabetes mellitus (dx 11/2020) Hx of COVD-19 admx in 11/2020 w hypoxic resp failure 2/2 COVID-19 pneumonia  The patient is here with his wife today.  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  He felt like something was pushing on his chest and he is not sure if it was related to the stress of driving, anxiety, or physical exertion involved.  Symptoms resolved after a period of rest.  He had no associated shortness of breath, diaphoresis, or nausea.  He has had no other episodes of chest discomfort.  He denies shortness of breath, edema, heart palpitations, orthopnea, or PND.  The patient is compliant with his medications.  Past Medical History:  Diagnosis Date   Ascending aorta dilatation ([place])    Echo 4/22: 39 mm // Chest CT 2/22: "nonaneurysmal aorta"   CAD (coronary artery disease)    a. 07/2012 Anterolateral MI/Cath: LAD 100 100%-> 3.0x16 Promus Element DES, otw nonobs dzs, EF 30%   Cardiac arrest ([place])    DM2 (diabetes mellitus, type 2) ([place])    History of COVID-19    prolonged hospitalization c/b pneumonia, pneumomediastinum   HLD (hyperlipidemia)    HTN (hypertension)    Ischemic cardiomyopathy    a. 07/2012 Echo: EF 25-30% w/ mid ant, basal inf, sept Sev HK & apical, dist post, mid/dist inf, dist ant, dist lat AK. Gr 1 DD, mild MR.  // Echo 3/14: EF 50-55 // Echo 4/22: EF 50-55, anteroseptal and inferoseptal HK, moderate asymmetric LVH, GR 1 DD, normal RVSF, trivial MR, mild dilation of ascending aorta (39 mm)    MI (myocardial infarction) ([facility])    VF (ventricular fibrillation) ([place])    a. in setting of MI 07/2012    Past Surgical History:  Procedure Laterality Date   [place] N/A 07/08/2012   Procedure: LEFT HEART CATHETERIZATION WITH CORONARY ANGIOGRAM;  Surgeon: Sherren Mocha, MD;  Location: [facility] CATH LAB;  Service: Cardiovascular;  Laterality: N/A;   PERCUTANEOUS CORONARY STENT INTERVENTION (PCI-S)  07/08/2012   Procedure: PERCUTANEOUS CORONARY STENT INTERVENTION (PCI-S);  Surgeon: Sherren Mocha, MD;  Location: [facility] CATH LAB;  Service: Cardiovascular;;    Current Medications: Current Meds  Medication Sig   acetaminophen (TYLENOL) 500 MG tablet Take 1,000 mg by mouth every 6 (six) hours as needed for mild pain.   albuterol (VENTOLIN HFA) 108 (90 Base) MCG/ACT inhaler Inhale 2 puffs into the lungs every 4 (four) hours as needed for shortness of breath or wheezing.   aspirin EC 81 MG tablet Take 1 tablet (81 mg total) by mouth daily.   budesonide-formoterol (SYMBICORT) 160-4.5 MCG/ACT inhaler Inhale 2 puffs into the lungs 2 (two) times daily.   carvedilol (COREG) 3.125  MG tablet Take 1 tablet by mouth twice daily   Multiple Vitamin (MULTIVITAMIN WITH MINERALS) TABS tablet Take 1 tablet by mouth daily.   nitroGLYCERIN (NITROSTAT) 0.4 MG SL tablet Place 1 tablet (0.4 mg total) under the tongue every 5 (five) minutes as needed for chest pain.   omeprazole (PRILOSEC OTC) 20 MG tablet Take 20 mg by mouth daily.   rosuvastatin (CRESTOR) 20 MG tablet Take 1 tablet (20 mg total) by mouth daily.   [DISCONTINUED] icosapent Ethyl (VASCEPA) 1 g capsule Take 2 capsules (2 g total) by mouth 2 (two) times daily.     Allergies:   Ibuprofen   Social  History   Socioeconomic History   Marital status: Married    Spouse name: Not on file   Number of children: Not on file   Years of education: Not on file   Highest education level: Not on file  Occupational History   Occupation: Cabin crew  Tobacco Use   Smoking status: Never   Smokeless tobacco: Never  Vaping Use   Vaping Use: Never used  Substance and Sexual Activity   Alcohol use: No   Drug use: No   Sexual activity: Not on file    Comment: did not ask  Other Topics Concern   Not on file  Social History Narrative   He is not able to remember any of his past medical history, other than  He had an appendectomy. He cannot remember any medicines he takes or any conditions for which he receives treatment. He also cannot remember the name of his physician. Patient lives with wife in [place] and drives racecars for a hobby.   Social Determinants of Health   Financial Resource Strain: Not on file  Food Insecurity: Not on file  Transportation Needs: Not on file  Physical Activity: Not on file  Stress: Not on file  Social Connections: Not on file     Family History: The patient's family history includes Heart attack (age of onset: 58) in an other family member; Heart attack (age of onset: 21) in his sister; Heart attack (age of onset: 42) in his father.  ROS:   Please see the history of present illness.    All other systems reviewed and are negative.  EKGs/Labs/Other Studies Reviewed:    The following studies were reviewed today: Echocardiogram 02/24/2021:  1. Left ventricular ejection fraction, by estimation, is 50 to 55%. The  left ventricle has normal function. The left ventricle demonstrates  regional wall motion abnormalities (see scoring diagram/findings for  description). Focal akinesis of mid  anteroseptal/inferoseptal walls with overall normal systolic function.  There is moderate asymmetric left ventricular hypertrophy of the  basal-septal segment. Left  ventricular diastolic parameters are consistent  with Grade I diastolic dysfunction  (impaired relaxation).   2. Right ventricular systolic function is normal. The right ventricular  size is normal. Tricuspid regurgitation signal is inadequate for assessing  PA pressure.   3. The mitral valve is normal in structure. Trivial mitral valve  regurgitation.   4. The aortic valve is tricuspid. Aortic valve regurgitation is not  visualized. No aortic stenosis is present.   5. Aortic dilatation noted. There is mild dilatation of the ascending  aorta, measuring 39 mm.    Exercise stress Myoview scan 01/28/2017: Pt walked for 8:30 of a Bruce protocol GXt. Peak HR of 153 which is 93% predicted maximal HR There were no ST or T wave changes to suggest ischemia. This is a low risk  study. Nuclear stress EF: 54%. The left ventricular ejection fraction is normal (55-65%). The study is normal.  Event monitor 02/24/21: The basic rhythm is normal sinus with an average HR of 84 bpm No atrial fibrillation or flutter No high-grade heart block or pathologic pauses There are occasional PVC's and occasional supraventricular beats without sustained arrhythmias   Overall there are no significant arrhythmias identified  EKG:  EKG is not ordered today.    Recent Labs: 12/15/2020: Magnesium 2.2 12/27/2020: B Natriuretic Peptide 67.0 12/28/2020: BUN 16; Creatinine, Ser 0.79; Hemoglobin 12.1; Platelets 261; Potassium 3.8; Sodium 141 07/21/2021: ALT 17  Recent Lipid Panel    Component Value Date/Time   CHOL 127 07/21/2021 1048   TRIG 318 (H) 07/21/2021 1048   HDL 29 (L) 07/21/2021 1048   CHOLHDL 4.4 07/21/2021 1048   CHOLHDL 4.9 11/15/2015 0933   VLDL 44 (H) 11/15/2015 0933   LDLCALC 49 07/21/2021 1048   LDLDIRECT 81.1 09/21/2014 0834     Risk Assessment/Calculations:           Physical Exam:    VS:  BP 120/84    Pulse 77    Ht 5\' 11"  (1.803 m)    Wt 213 lb 6.4 oz (96.8 kg)    SpO2 96%    BMI 29.76  kg/m     Wt Readings from Last 3 Encounters:  11/05/21 213 lb 6.4 oz (96.8 kg)  10/08/21 217 lb 12.8 oz (98.8 kg)  03/12/21 209 lb (94.8 kg)     GEN:  Well nourished, well developed in no acute distress HEENT: Normal NECK: No JVD; No carotid bruits LYMPHATICS: No lymphadenopathy CARDIAC: RRR, no murmurs, rubs, gallops RESPIRATORY:  Clear to auscultation without rales, wheezing or rhonchi  ABDOMEN: Soft, non-tender, non-distended MUSCULOSKELETAL:  No edema; No deformity  SKIN: Warm and dry NEUROLOGIC:  Alert and oriented x 3 PSYCHIATRIC:  Normal affect   ASSESSMENT:    1. Coronary artery disease involving native coronary artery of native heart with other form of angina pectoris ([place])   2. Mixed hyperlipidemia   3. Essential hypertension    PLAN:    In order of problems listed above:  The patient has had 2 episodes concerning for angina.  With his known history of CAD and old MI, I have recommended a stress Myoview scan for further evaluation.  He will continue on aspirin for antiplatelet therapy, low-dose beta-blocker with carvedilol, and rosuvastatin 20 mg daily. Blood pressure is controlled on low-dose carvedilol. Lipids are reviewed.  LDL cholesterol is 49 mg/dL, total cholesterol 127.  He will continue on Crestor 20 mg daily.  His triglycerides are elevated at 318.  I discussed lifestyle modification with him at length.  He understands the need to reduce simple starches, sugars, and fatty foods.  He will work on dietary modification.      Shared Decision Making/Informed Consent The risks [chest pain, shortness of breath, cardiac arrhythmias, dizziness, blood pressure fluctuations, myocardial infarction, stroke/transient ischemic attack, nausea, vomiting, allergic reaction, radiation exposure, metallic taste sensation and life-threatening complications (estimated to be 1 in 10,000)], benefits (risk stratification, diagnosing coronary artery disease, treatment guidance) and  alternatives of a nuclear stress test were discussed in detail with Mr. Kuligowski and he agrees to proceed.    Medication Adjustments/Labs and Tests Ordered: Current medicines are reviewed at length with the patient today.  Concerns regarding medicines are outlined above.  Orders Placed This Encounter  Procedures   MYOCARDIAL PERFUSION IMAGING   Meds ordered this  encounter  Medications   icosapent Ethyl (VASCEPA) 1 g capsule    Sig: Take 2 capsules (2 g total) by mouth 2 (two) times daily.    Dispense:  120 capsule    Refill:  11    Patient Instructions  Medication Instructions:  Your physician recommends that you continue on your current medications as directed. Please refer to the Current Medication list given to you today.  *If you need a refill on your cardiac medications before your next appointment, please call your pharmacy*  Testing/Procedures: Your physician has requested that you have en exercise stress myoview. For further information please visit [email or web address]. Please follow instruction sheet, as given.  Follow-Up: At [facility], you and your health needs are our priority.  As part of our continuing mission to provide you with exceptional heart care, we have created designated Provider Care Teams.  These Care Teams include your primary Cardiologist (physician) and Advanced Practice Providers (APPs -  Physician Assistants and Nurse Practitioners) who all work together to provide you with the care you need, when you need it.  Your next appointment:   1 year(s)  The format for your next appointment:   In Person  Provider:   Sherren Mocha, MD       Signed, Sherren Mocha, MD  11/05/2021 8:44 AM    [place]

## 2021-11-05 NOTE — Patient Instructions (Addendum)
Medication Instructions:  Your physician recommends that you continue on your current medications as directed. Please refer to the Current Medication list given to you today.  *If you need a refill on your cardiac medications before your next appointment, please call your pharmacy*  Testing/Procedures: Your physician has requested that you have an exercise stress myoview. For further information please visit [email or web address] Please follow instruction sheet, as given.  Follow-Up: At [facility], you and your health needs are our priority.  As part of our continuing mission to provide you with exceptional heart care, we have created designated Provider Care Teams.  These Care Teams include your primary Cardiologist (physician) and Advanced Practice Providers (APPs -  Physician Assistants and Nurse Practitioners) who all work together to provide you with the care you need, when you need it.  Your next appointment:   1 year(s)  The format for your next appointment:   In Person  Provider:   [name], MD

## 2021-11-06 ENCOUNTER — Telehealth: Payer: Self-pay

## 2021-11-06 NOTE — Telephone Encounter (Signed)
**Note De-Identified Amilee Janvier Obfuscation** [employer] PA started through covermymeds. Key: Raina Mina

## 2021-11-10 ENCOUNTER — Encounter (HOSPITAL_COMMUNITY): Payer: Managed Care, Other (non HMO)

## 2021-11-12 ENCOUNTER — Encounter (HOSPITAL_COMMUNITY): Payer: Managed Care, Other (non HMO)

## 2021-11-13 ENCOUNTER — Telehealth (HOSPITAL_COMMUNITY): Payer: Self-pay | Admitting: *Deleted

## 2021-11-13 NOTE — Telephone Encounter (Signed)
Patient given detailed instructions per Myocardial Perfusion Study Information Sheet for the test on 11/20/21 Patient notified to arrive 15 minutes early and that it is imperative to arrive on time for appointment to keep from having the test rescheduled. ° If you need to cancel or reschedule your appointment, please call the office within 24 hours of your appointment. . Patient verbalized understanding.Curtis Rosales ° ° °

## 2021-11-20 ENCOUNTER — Ambulatory Visit (HOSPITAL_COMMUNITY): Payer: Commercial Managed Care - HMO | Attending: Internal Medicine

## 2021-11-20 ENCOUNTER — Encounter (HOSPITAL_COMMUNITY): Payer: Managed Care, Other (non HMO)

## 2021-11-20 ENCOUNTER — Other Ambulatory Visit: Payer: Self-pay

## 2021-11-20 DIAGNOSIS — E782 Mixed hyperlipidemia: Secondary | ICD-10-CM | POA: Diagnosis not present

## 2021-11-20 DIAGNOSIS — I25118 Atherosclerotic heart disease of native coronary artery with other forms of angina pectoris: Secondary | ICD-10-CM | POA: Diagnosis present

## 2021-11-20 DIAGNOSIS — I10 Essential (primary) hypertension: Secondary | ICD-10-CM | POA: Insufficient documentation

## 2021-11-20 LAB — MYOCARDIAL PERFUSION IMAGING
Angina Index: 0
Estimated workload: 7
Exercise duration (min): 6 min
LV dias vol: 85 mL (ref 62–150)
LV sys vol: 35 mL
MPHR: 161 {beats}/min
Nuc Stress EF: 59 %
Peak HR: 146 {beats}/min
Percent HR: 91 %
RPE: 18
Rest HR: 62 {beats}/min
Rest Nuclear Isotope Dose: 10.1 mCi
SDS: 4
SRS: 2
SSS: 6
Stress Nuclear Isotope Dose: 29.5 mCi
TID: 0.81

## 2021-11-20 MED ORDER — TECHNETIUM TC 99M TETROFOSMIN IV KIT
29.5000 | PACK | Freq: Once | INTRAVENOUS | Status: AC | PRN
  Administered 2021-11-20: 29.5 via INTRAVENOUS
  Filled 2021-11-20: qty 30

## 2021-11-20 MED ORDER — TECHNETIUM TC 99M TETROFOSMIN IV KIT
10.1000 | PACK | Freq: Once | INTRAVENOUS | Status: AC | PRN
  Administered 2021-11-20: 10.1 via INTRAVENOUS
  Filled 2021-11-20: qty 11

## 2021-11-25 ENCOUNTER — Telehealth: Payer: Self-pay | Admitting: *Deleted

## 2021-11-25 ENCOUNTER — Encounter: Payer: Self-pay | Admitting: *Deleted

## 2021-11-25 DIAGNOSIS — Z01812 Encounter for preprocedural laboratory examination: Secondary | ICD-10-CM

## 2021-11-25 DIAGNOSIS — I25118 Atherosclerotic heart disease of native coronary artery with other forms of angina pectoris: Secondary | ICD-10-CM

## 2021-11-25 NOTE — Telephone Encounter (Signed)
-----   Message from [name], MD sent at 11/25/2021  3:04 PM EST ----- Reviewed study findings with the patient's wife.  The patient has an intermediate risk stress test with ST depression on EKG and a mixed defect in the inferolateral septal and basal septal walls with both infarct and ischemia.  He has had more than one episode of angina.  I have recommended cardiac catheterization for definitive evaluation in the setting of typical angina and an intermediate risk stress Myoview.  I discussed risks, indications, and alternatives to the procedure with the patient's wife.  She will talk with Channing Mutters and is certain that he will want to proceed.  We will contact him when his heart catheterization is scheduled and will be available to answer any questions that he may have.  He is advised to avoid strenuous activity in the interim.  Note to Triage RN: please schedule on my next available cath lab slot. Thank you

## 2021-11-25 NOTE — Telephone Encounter (Signed)
Cath scheduled for 12/04/21 at 7:30.  I spoke with patient's wife and went over cath instructions with her.  Will send instructions through my chart.  Patient will come in for lab work on 11/27/21

## 2021-11-27 ENCOUNTER — Other Ambulatory Visit: Payer: Self-pay

## 2021-11-27 ENCOUNTER — Other Ambulatory Visit: Payer: Managed Care, Other (non HMO)

## 2021-11-27 DIAGNOSIS — Z01812 Encounter for preprocedural laboratory examination: Secondary | ICD-10-CM

## 2021-11-27 DIAGNOSIS — I25118 Atherosclerotic heart disease of native coronary artery with other forms of angina pectoris: Secondary | ICD-10-CM

## 2021-11-27 LAB — BASIC METABOLIC PANEL
BUN/Creatinine Ratio: 16 (ref 10–24)
BUN: 14 mg/dL (ref 8–27)
CO2: 25 mmol/L (ref 20–29)
Calcium: 9.4 mg/dL (ref 8.6–10.2)
Chloride: 105 mmol/L (ref 96–106)
Creatinine, Ser: 0.9 mg/dL (ref 0.76–1.27)
Glucose: 124 mg/dL — ABNORMAL HIGH (ref 70–99)
Potassium: 4.2 mmol/L (ref 3.5–5.2)
Sodium: 138 mmol/L (ref 134–144)
eGFR: 98 mL/min/{1.73_m2} (ref >59)

## 2021-11-27 LAB — CBC
Hematocrit: 40.4 % (ref 37.5–51.0)
Hemoglobin: 14.1 g/dL (ref 13.0–17.7)
MCH: 26.4 pg — ABNORMAL LOW (ref 26.6–33.0)
MCHC: 34.9 g/dL (ref 31.5–35.7)
MCV: 76 fL — ABNORMAL LOW (ref 79–97)
Platelets: 276 10*3/uL (ref 150–450)
RBC: 5.34 x10E6/uL (ref 4.14–5.80)
RDW: 15.6 % — ABNORMAL HIGH (ref 11.6–15.4)
WBC: 7.6 10*3/uL (ref 3.4–10.8)

## 2021-12-02 ENCOUNTER — Telehealth: Payer: Self-pay | Admitting: *Deleted

## 2021-12-02 NOTE — Telephone Encounter (Signed)
Cardiac catheterization scheduled at [facility] for: Thursday December 04, 2021 7:30 AM Arrive [facility] Main Entrance [facility]) at: 5:30 AM   Diet-no solid food after midnight prior to cath, clear liquids until 5 AM day of procedure.  Medication instructions for procedure: -Usual morning medications can be taken pre-cath with sips of water including aspirin 81 mg.    Confirmed patient has responsible adult to drive home post procedure and be with patient first 24 hours after arriving home.  [facility] does allow one visitor to accompany you and wait in the hospital waiting room while you are there for your procedure. You and your visitor will be asked to wear a mask once you enter the hospital.   Patient reports does not currently have any new symptoms concerning for COVID-19 and no household members with COVID-19 like illness.               Reviewed procedure/mask/visitor instructions with patient's wife (DPR).

## 2021-12-04 ENCOUNTER — Other Ambulatory Visit: Payer: Self-pay

## 2021-12-04 ENCOUNTER — Encounter (HOSPITAL_COMMUNITY)
Admission: RE | Disposition: A | Discharge status: Home / Self Care | Payer: Managed Care, Other (non HMO) | Source: Home / Self Care | Attending: Cardiovascular Disease

## 2021-12-04 ENCOUNTER — Encounter (HOSPITAL_COMMUNITY): Payer: Self-pay | Admitting: Cardiovascular Disease

## 2021-12-04 ENCOUNTER — Telehealth: Payer: Self-pay | Admitting: Pulmonary Disease

## 2021-12-04 ENCOUNTER — Ambulatory Visit (HOSPITAL_COMMUNITY)
Admission: RE | Admit: 2021-12-04 | Discharge: 2021-12-04 | Disposition: A | Discharge status: Home / Self Care | Payer: Commercial Managed Care - HMO | Attending: Cardiovascular Disease | Admitting: Cardiovascular Disease

## 2021-12-04 DIAGNOSIS — Z955 Presence of coronary angioplasty implant and graft: Secondary | ICD-10-CM | POA: Diagnosis not present

## 2021-12-04 DIAGNOSIS — Z7982 Long term (current) use of aspirin: Secondary | ICD-10-CM | POA: Insufficient documentation

## 2021-12-04 DIAGNOSIS — I255 Ischemic cardiomyopathy: Secondary | ICD-10-CM | POA: Insufficient documentation

## 2021-12-04 DIAGNOSIS — I25119 Atherosclerotic heart disease of native coronary artery with unspecified angina pectoris: Secondary | ICD-10-CM | POA: Diagnosis present

## 2021-12-04 DIAGNOSIS — I700 Atherosclerosis of aorta: Secondary | ICD-10-CM | POA: Insufficient documentation

## 2021-12-04 DIAGNOSIS — T82855A Stenosis of coronary artery stent, initial encounter: Secondary | ICD-10-CM | POA: Insufficient documentation

## 2021-12-04 DIAGNOSIS — Z8674 Personal history of sudden cardiac arrest: Secondary | ICD-10-CM | POA: Insufficient documentation

## 2021-12-04 DIAGNOSIS — I10 Essential (primary) hypertension: Secondary | ICD-10-CM | POA: Insufficient documentation

## 2021-12-04 DIAGNOSIS — Z79899 Other long term (current) drug therapy: Secondary | ICD-10-CM | POA: Diagnosis not present

## 2021-12-04 DIAGNOSIS — E782 Mixed hyperlipidemia: Secondary | ICD-10-CM | POA: Insufficient documentation

## 2021-12-04 DIAGNOSIS — Y848 Other medical procedures as the cause of abnormal reaction of the patient, or of later complication, without mention of misadventure at the time of the procedure: Secondary | ICD-10-CM | POA: Insufficient documentation

## 2021-12-04 DIAGNOSIS — Z8616 Personal history of COVID-19: Secondary | ICD-10-CM | POA: Insufficient documentation

## 2021-12-04 DIAGNOSIS — E119 Type 2 diabetes mellitus without complications: Secondary | ICD-10-CM | POA: Insufficient documentation

## 2021-12-04 DIAGNOSIS — I252 Old myocardial infarction: Secondary | ICD-10-CM | POA: Diagnosis not present

## 2021-12-04 DIAGNOSIS — I25118 Atherosclerotic heart disease of native coronary artery with other forms of angina pectoris: Secondary | ICD-10-CM | POA: Diagnosis present

## 2021-12-04 HISTORY — PX: LEFT HEART CATH AND CORONARY ANGIOGRAPHY: CATH118249

## 2021-12-04 SURGERY — LEFT HEART CATH AND CORONARY ANGIOGRAPHY
Anesthesia: LOCAL

## 2021-12-04 MED ORDER — MIDAZOLAM HCL 2 MG/2ML IJ SOLN
INTRAMUSCULAR | Status: DC | PRN
  Administered 2021-12-04: 2 mg via INTRAVENOUS

## 2021-12-04 MED ORDER — SODIUM CHLORIDE 0.9% FLUSH: 3.0000 mL | INTRAVENOUS | Status: DC | PRN

## 2021-12-04 MED ORDER — FENTANYL CITRATE (PF) 100 MCG/2ML IJ SOLN
INTRAMUSCULAR | Status: AC
  Filled 2021-12-04: qty 2

## 2021-12-04 MED ORDER — HYDRALAZINE HCL 20 MG/ML IJ SOLN: 10.0000 mg | INTRAMUSCULAR | Status: DC | PRN

## 2021-12-04 MED ORDER — SODIUM CHLORIDE 0.9% FLUSH: 3.0000 mL | Freq: Two times a day (BID) | INTRAVENOUS | Status: DC

## 2021-12-04 MED ORDER — SODIUM CHLORIDE 0.9 % WEIGHT BASED INFUSION: 1.0000 mL/kg/h | INTRAVENOUS | Status: DC

## 2021-12-04 MED ORDER — VERAPAMIL HCL 2.5 MG/ML IV SOLN
INTRAVENOUS | Status: AC
  Filled 2021-12-04: qty 2

## 2021-12-04 MED ORDER — LIDOCAINE HCL (PF) 1 % IJ SOLN
INTRAMUSCULAR | Status: DC | PRN
  Administered 2021-12-04: 2 mL

## 2021-12-04 MED ORDER — ACETAMINOPHEN 325 MG PO TABS: 650.0000 mg | ORAL_TABLET | ORAL | Status: DC | PRN

## 2021-12-04 MED ORDER — LABETALOL HCL 5 MG/ML IV SOLN: 10.0000 mg | INTRAVENOUS | Status: DC | PRN

## 2021-12-04 MED ORDER — HEPARIN SODIUM (PORCINE) 1000 UNIT/ML IJ SOLN
INTRAMUSCULAR | Status: DC | PRN
  Administered 2021-12-04: 5000 [IU] via INTRAVENOUS

## 2021-12-04 MED ORDER — MIDAZOLAM HCL 2 MG/2ML IJ SOLN
INTRAMUSCULAR | Status: AC
  Filled 2021-12-04: qty 2

## 2021-12-04 MED ORDER — HEPARIN (PORCINE) IN NACL 1000-0.9 UT/500ML-% IV SOLN
INTRAVENOUS | Status: DC | PRN
  Administered 2021-12-04 (×2): 500 mL

## 2021-12-04 MED ORDER — FENTANYL CITRATE (PF) 100 MCG/2ML IJ SOLN
INTRAMUSCULAR | Status: DC | PRN
  Administered 2021-12-04: 25 ug via INTRAVENOUS

## 2021-12-04 MED ORDER — ONDANSETRON HCL 4 MG/2ML IJ SOLN: 4.0000 mg | Freq: Four times a day (QID) | INTRAMUSCULAR | Status: DC | PRN

## 2021-12-04 MED ORDER — HEPARIN SODIUM (PORCINE) 1000 UNIT/ML IJ SOLN
INTRAMUSCULAR | Status: AC
  Filled 2021-12-04: qty 10

## 2021-12-04 MED ORDER — SODIUM CHLORIDE 0.9 % WEIGHT BASED INFUSION
3.0000 mL/kg/h | INTRAVENOUS | Status: AC
  Administered 2021-12-04: 3 mL/kg/h via INTRAVENOUS

## 2021-12-04 MED ORDER — LIDOCAINE HCL (PF) 1 % IJ SOLN
INTRAMUSCULAR | Status: AC
  Filled 2021-12-04: qty 30

## 2021-12-04 MED ORDER — ASPIRIN 81 MG PO CHEW: 81.0000 mg | CHEWABLE_TABLET | ORAL | Status: AC

## 2021-12-04 MED ORDER — IOHEXOL 350 MG/ML SOLN
INTRAVENOUS | Status: DC | PRN
  Administered 2021-12-04: 40 mL via INTRA_ARTERIAL

## 2021-12-04 MED ORDER — SODIUM CHLORIDE 0.9 % IV SOLN: 250.0000 mL | INTRAVENOUS | Status: DC | PRN

## 2021-12-04 MED ORDER — VERAPAMIL HCL 2.5 MG/ML IV SOLN
INTRAVENOUS | Status: DC | PRN
  Administered 2021-12-04: 10 mL via INTRA_ARTERIAL

## 2021-12-04 MED ORDER — HEPARIN (PORCINE) IN NACL 1000-0.9 UT/500ML-% IV SOLN
INTRAVENOUS | Status: AC
  Filled 2021-12-04: qty 1000

## 2021-12-04 SURGICAL SUPPLY — 9 items

## 2021-12-04 NOTE — Interval H&P Note (Signed)
Cath Lab Visit (complete for each Cath Lab visit)  Clinical Evaluation Leading to the Procedure:   ACS: No.  Non-ACS:    Anginal Classification: CCS II  Anti-ischemic medical therapy: Minimal Therapy (1 class of medications)  Non-Invasive Test Results: Intermediate-risk stress test findings: cardiac mortality 1-3%/year  Prior CABG: No previous CABG      History and Physical Interval Note:  12/04/2021 7:31 AM  Curtis Rosales  has presented today for surgery, with the diagnosis of abnormal stress test.  The various methods of treatment have been discussed with the patient and family. After consideration of risks, benefits and other options for treatment, the patient has consented to  Procedure(s): LEFT HEART CATH AND CORONARY ANGIOGRAPHY (N/A) as a surgical intervention.  The patient's history has been reviewed, patient examined, no change in status, stable for surgery.  I have reviewed the patient's chart and labs.  Questions were answered to the patient's satisfaction.     [name]

## 2021-12-04 NOTE — Telephone Encounter (Signed)
Anita, please advise. Thanks 

## 2021-12-05 NOTE — Telephone Encounter (Signed)
I have called and left a message asking the wife to call with the new insurance information so I can resubmit to [facility]

## 2021-12-18 NOTE — Telephone Encounter (Signed)
Mrs. Counsell finally called me back with the new insurance information I have now refaxed the Split night Sleep Study order back to [facility]

## 2021-12-30 ENCOUNTER — Ambulatory Visit: Payer: Managed Care, Other (non HMO) | Admitting: Primary Care

## 2022-01-06 ENCOUNTER — Other Ambulatory Visit: Payer: Self-pay

## 2022-01-06 ENCOUNTER — Ambulatory Visit: Payer: Managed Care, Other (non HMO) | Admitting: Pulmonary Disease

## 2022-01-06 ENCOUNTER — Encounter: Payer: Self-pay | Admitting: Pulmonary Disease

## 2022-01-06 ENCOUNTER — Ambulatory Visit (INDEPENDENT_AMBULATORY_CARE_PROVIDER_SITE_OTHER): Payer: Managed Care, Other (non HMO) | Admitting: Pulmonary Disease

## 2022-01-06 VITALS — BP 106/80 | HR 89 | Temp 97.3°F | Ht 71.0 in | Wt 211.4 lb

## 2022-01-06 DIAGNOSIS — G4734 Idiopathic sleep related nonobstructive alveolar hypoventilation: Secondary | ICD-10-CM | POA: Diagnosis not present

## 2022-01-06 DIAGNOSIS — J8410 Pulmonary fibrosis, unspecified: Secondary | ICD-10-CM

## 2022-01-06 DIAGNOSIS — R0683 Snoring: Secondary | ICD-10-CM

## 2022-01-06 NOTE — Progress Notes (Signed)
? ?Subjective:  ? ? Patient ID: Curtis Rosales, male    DOB: 26-Feb-1961, 61 y.o.   MRN: [id number] ?Patient Care Team: ?Marina Goodell, MD as PCP - General (Family Medicine) ?[name], MD as PCP - Cardiology (Cardiology) ? ?Chief Complaint  ?Patient presents with  ? Follow-up  ?  Pulmonary fibrosis post-COVID, sleep disordered breathing  ? 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  He also has issues with persistent nocturnal hypoxemia.  The patient was hospitalized at [facility] in mid to late January 2022 for COVID-19 pneumonia and respiratory failure.  He never required BiPAP assistance or ventilator support.  The patient however remained oxygen dependent for a number of months but eventually was able to wean off of oxygen.  Pulmonary function testing performed 12 February 2021 showed moderate restriction and moderate diffusion capacity impairment.  He also did have some small airways component.  After initial visit with me on 12 Mar 2021, he was evaluated at the time that he had been weaned off of oxygen.  An overnight oximetry however was ordered and this was performed on 2 June 222.  He was instructed to continue using nocturnal oxygen due to desaturations as low as 85%.  However the desaturation events were numerous and so erratic that a formal sleep study was warranted.  The patient was changing insurances and the sleep study never came to fruition.  He has been on Symbicort but does not note that this makes an impact.  He very rarely uses as needed albuterol. ? ?Today on follow-up he has still some persistent dyspnea on exertion however he notes that he has more stamina.  He does however note nonrestorative sleep and daytime sleepiness.  Patient's wife states that she has to wake him up sometimes in the middle of the night because he quits breathing.  He also has significant snoring noted as  well. ? ?Patient has had no fevers, chills or sweats.  No chest pain.  No gastroesophageal reflux symptoms.  No nocturnal awakenings.  He voices no other complaint.  Overall he looks well ? ? ?Review of Systems ?A 10 point review of systems was performed and it is as noted above otherwise negative. ? ?Patient Active Problem List  ? Diagnosis Date Noted  ? Chronic respiratory failure with hypoxia (HCC) 01/24/2021  ? Physical deconditioning 01/24/2021  ? SIRS (systemic inflammatory response syndrome) (HCC) 12/27/2020  ? Hypotension 12/27/2020  ? Near syncope 12/27/2020  ? Sepsis due to pneumonia (HCC) 11/25/2020  ? Pneumonia due to COVID-19 virus 11/25/2020  ? Acute respiratory disease due to COVID-19 virus 11/24/2020  ? Acute on chronic respiratory failure with hypoxia (HCC) 11/24/2020  ? Hypertriglyceridemia 02/25/2015  ? Pure hypercholesterolemia 09/14/2014  ? Acute MI, anterolateral wall, initial episode of care (HCC) 07/12/2012  ? Cardiomyopathy, ischemic 07/12/2012  ? Coronary artery disease involving native coronary artery with angina pectoris (HCC) 07/12/2012  ? Ventricular fibrillation (HCC) 07/08/2012  ? ?Social History  ? ?Tobacco Use  ? Smoking status: Never  ? Smokeless tobacco: Never  ?Substance Use Topics  ? Alcohol use: No  ? ?Allergies  ?Allergen Reactions  ? Ibuprofen Other (See Comments)  ?  Heart issue  ? ?Current Meds  ?Medication Sig  ? acetaminophen (TYLENOL) 500 MG tablet Take 1,000 mg by mouth every 6 (six) hours as needed for mild pain.  ? albuterol (VENTOLIN HFA) 108 (90 Base) MCG/ACT  inhaler Inhale 2 puffs into the lungs every 4 (four) hours as needed for shortness of breath or wheezing.  ? aspirin EC 81 MG tablet Take 1 tablet (81 mg total) by mouth daily.  ? budesonide-formoterol (SYMBICORT) 160-4.5 MCG/ACT inhaler Inhale 2 puffs into the lungs 2 (two) times daily.  ? carvedilol (COREG) 3.125 MG tablet Take 1 tablet by mouth twice daily  ? icosapent Ethyl (VASCEPA) 1 g capsule Take 2  capsules (2 g total) by mouth 2 (two) times daily.  ? Multiple Vitamin (MULTIVITAMIN WITH MINERALS) TABS tablet Take 1 tablet by mouth daily.  ? nitroGLYCERIN (NITROSTAT) 0.4 MG SL tablet Place 1 tablet (0.4 mg total) under the tongue every 5 (five) minutes as needed for chest pain.  ? omeprazole (PRILOSEC OTC) 20 MG tablet Take 20 mg by mouth daily.  ? [DISCONTINUED] fenofibrate 160 MG tablet Take 1 tablet (160 mg total) by mouth daily.  ? ?Immunization History  ?Administered Date(s) Administered  ? Influenza,inj,Quad PF,6+ Mos 11/22/2012  ? ?   ?Objective:  ? Physical Exam ?BP 106/80 (BP Location: Left Arm, Patient Position: Sitting, Cuff Size: Normal)   Pulse 89   Temp (!) 97.3 ?F (36.3 ?C) (Oral)   Ht 5\' 11"  (1.803 m)   Wt 211 lb 6.4 oz (95.9 kg)   SpO2 98%   BMI 29.48 kg/m?  ?GENERAL: Overweight gentleman, no acute distress.  Fully ambulatory.  No conversational dyspnea. ?HEAD: Normocephalic, atraumatic.  ?EYES: Pupils equal, round, reactive to light.  No scleral icterus.  ?MOUTH: Nose/mouth/throat not examined due to masking requirements for COVID 19. ?NECK: Supple. No thyromegaly. Trachea midline. No JVD.  No adenopathy. ?PULMONARY: Good air entry bilaterally.  No adventitious sounds. ?CARDIOVASCULAR: S1 and S2. Regular rate and rhythm.  No rubs, murmurs or gallops heard. ?ABDOMEN: Obese otherwise benign. ?MUSCULOSKELETAL: No joint deformity, no clubbing, no edema.  ?NEUROLOGIC: No focal deficit, no gait disturbance, speech is fluent. ?SKIN: Intact,warm,dry.  On limited exam no rashes. ?PSYCH: Mood and behavior normal. ? ?Pulmonary Functions Testing Results: ?05 June 2021: ?FEV1 3.51 L or 92% predicted, FVC 3.84 L or 77% predicted, FEV1/FVC 91%, no bronchodilator response, lung volumes at low end of normal.  Diffusion capacity moderately impaired. ? ? ?Results of the Epworth flowsheet 01/06/2022  ?Sitting and reading 3  ?Watching TV 3  ?Sitting, inactive in a public place (e.g. a theatre or a meeting) 1   ?As a passenger in a car for an hour without a break 1  ?Lying down to rest in the afternoon when circumstances permit 3  ?Sitting and talking to someone 0  ?Sitting quietly after a lunch without alcohol 1  ?In a car, while stopped for a few minutes in traffic 0  ?Total score 12  ? ?   ?Assessment & Plan:  ? ?  ICD-10-CM   ?1. Postinflammatory pulmonary fibrosis (HCC)  J84.10   ? Recent PFTs show improvement ?Advised continued exercise and staying active ?Will observe expectantly ?Discontinue Symbicort  ?  ?2. Loud snoring and apneas  R06.83 Split night study  ? High suspect for sleep apnea ?Also erratic desaturation pattern on overnight oximetry ?Split-night study  ?  ?3. Nocturnal hypoxemia  G47.34   ? Continue oxygen for now ?Further determination with split-night study  ?  ? ?Orders Placed This Encounter  ?Procedures  ? Split night study  ?  Standing Status:   Future  ?  Standing Expiration Date:   01/07/2023  ?  Order Specific Question:  Where should this test be performed:  ?  Answer:   [place]  ? ?The patient has been instructed that he can discontinue Symbicort since he does not appear to get any benefit from the medication.  Continue nocturnal oxygen for now.  We have ordered a formal split-night study.  Follow-up will be in 3 months time he is to contact us prior to that time should any new arise.  We will relay results of the sleep study once these are known. ? ? ?C. Danice Goltz, MD ?Advanced Bronchoscopy ?PCCM [facility] ? ? ? ?*This note was dictated using voice recognition software/Dragon.  Despite best efforts to proofread, errors can occur which can change the meaning. Any transcriptional errors that result from this process are unintentional and may not be fully corrected at the time of dictation. ?

## 2022-01-06 NOTE — Patient Instructions (Signed)
We will order the split-night sleep study. ? ?You may stop the Symbicort. ? ?Try to stay as active as possible this will help you get better conditioned with regards to your shortness of breath. ? ? ?We will see you in follow-up in 3 months time call sooner should any new problems arise.  We will call you with the results of the split-night study once these are known. ? ? ?

## 2022-01-12 IMAGING — CR DG CHEST 2V
2 series · 2 of 2 positions shown · non-contrast
Comparison: Single-view of the chest 12/15/2020, 12/13/2020
12/01/2020 and 11/24/2020.

CLINICAL DATA: History of COVID pneumonia.

EXAM:
CHEST - 2 VIEW

[chest pa]
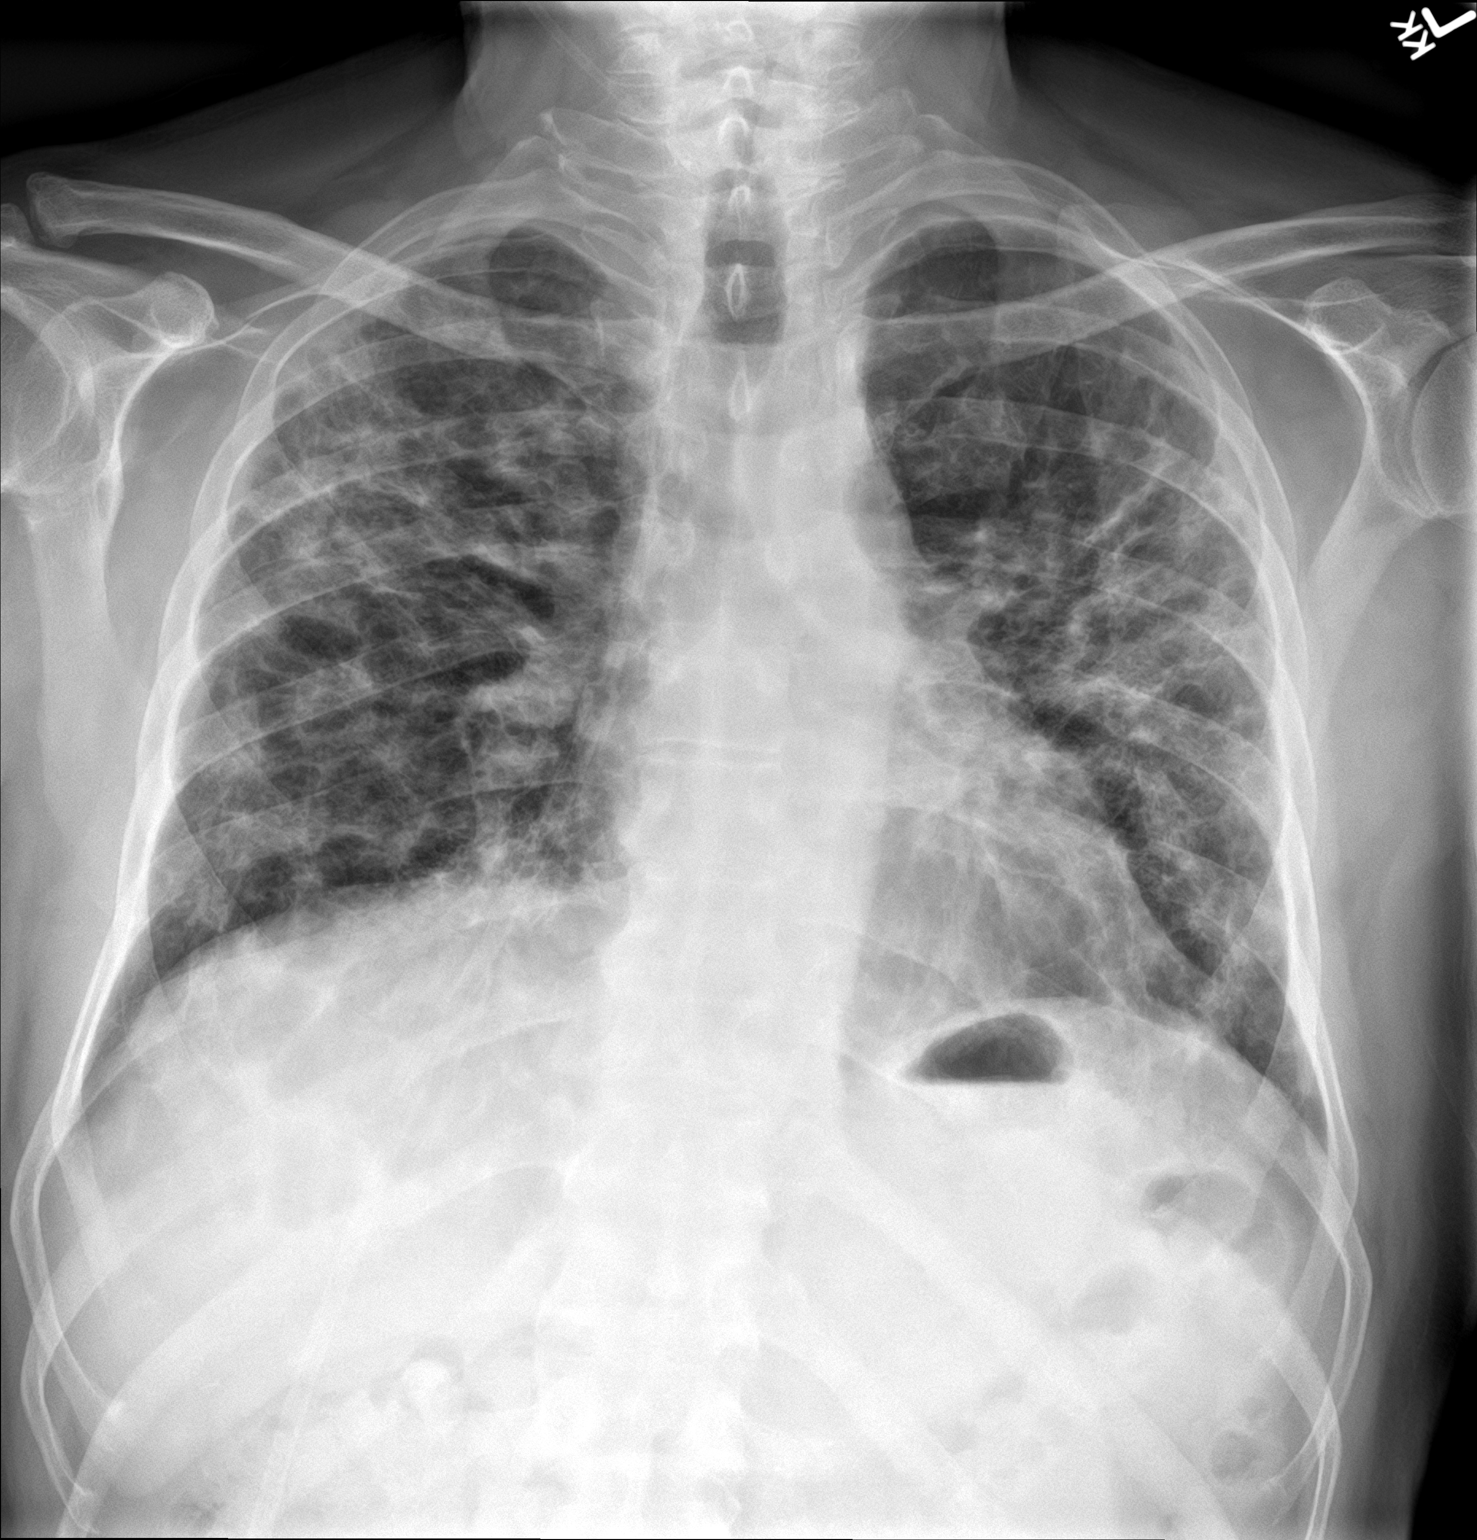

[chest lat]
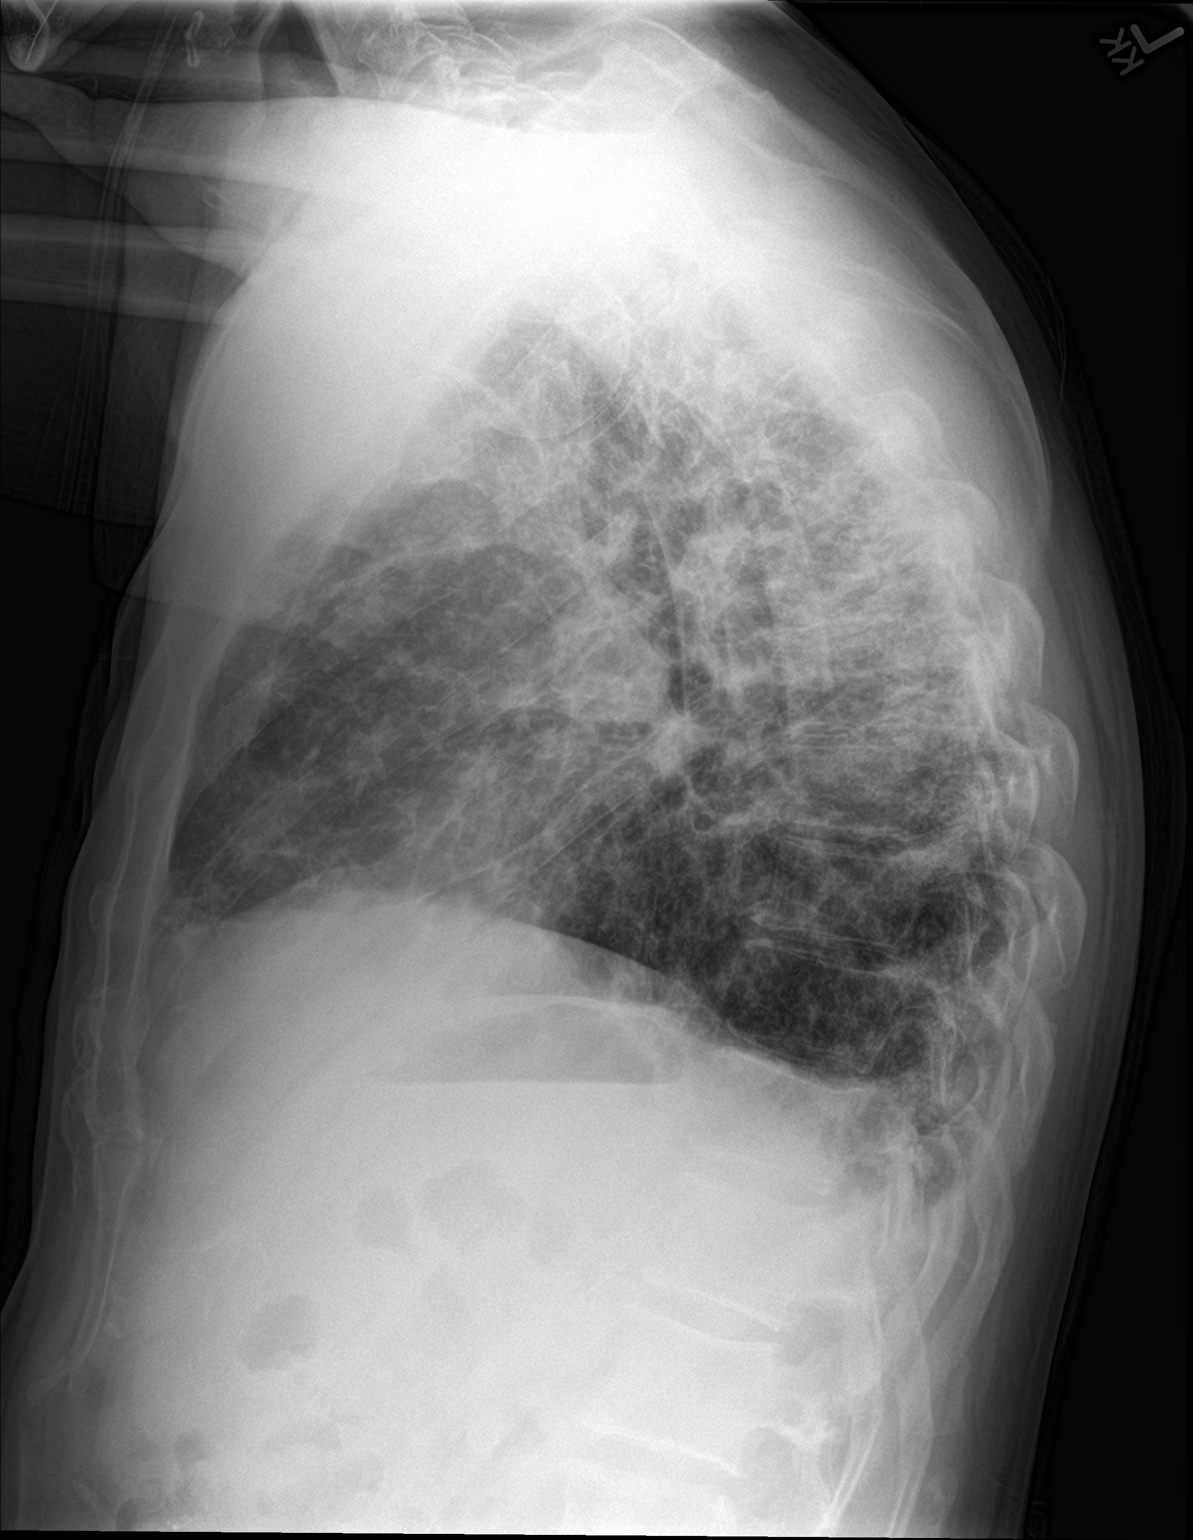

[2 of 2 positions shown; findings below may reference images not displayed]

FINDINGS: Bilateral pulmonary opacities persist without marked change since
the most recent examination. No pneumothorax or pleural effusion.
Heart size is normal.
IMPRESSION: Bilateral pulmonary opacities have an appearance most compatible
with post COVID fibrosis.

## 2022-01-19 IMAGING — CR DG CHEST 2V
2 series · 2 of 2 positions shown · non-contrast
Comparison: 12/20/2020

CLINICAL DATA: Shortness of breath. Recent COVID pneumonia.
Previous myocardial infarcts.

EXAM:
CHEST - 2 VIEW

[chest pa]
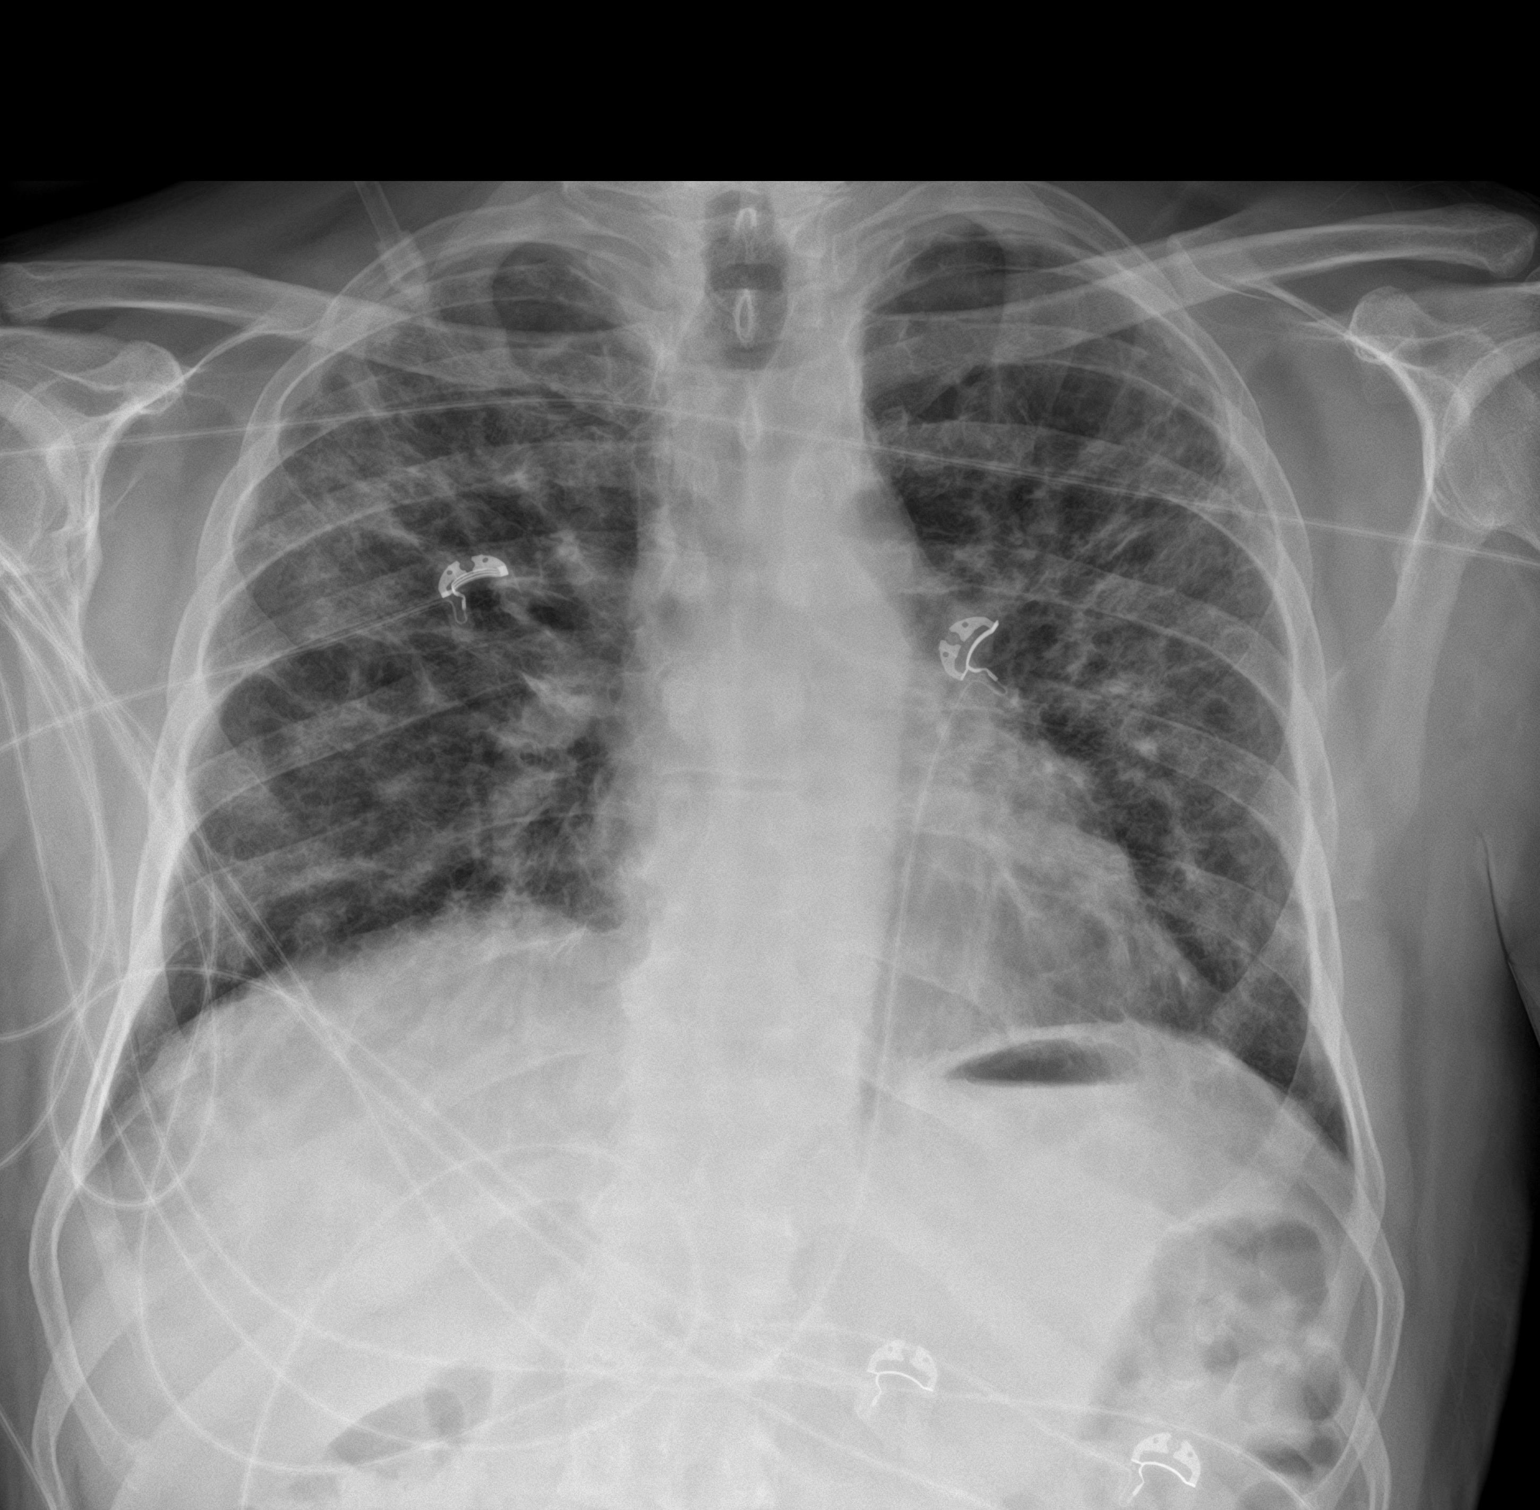

[chest lat]
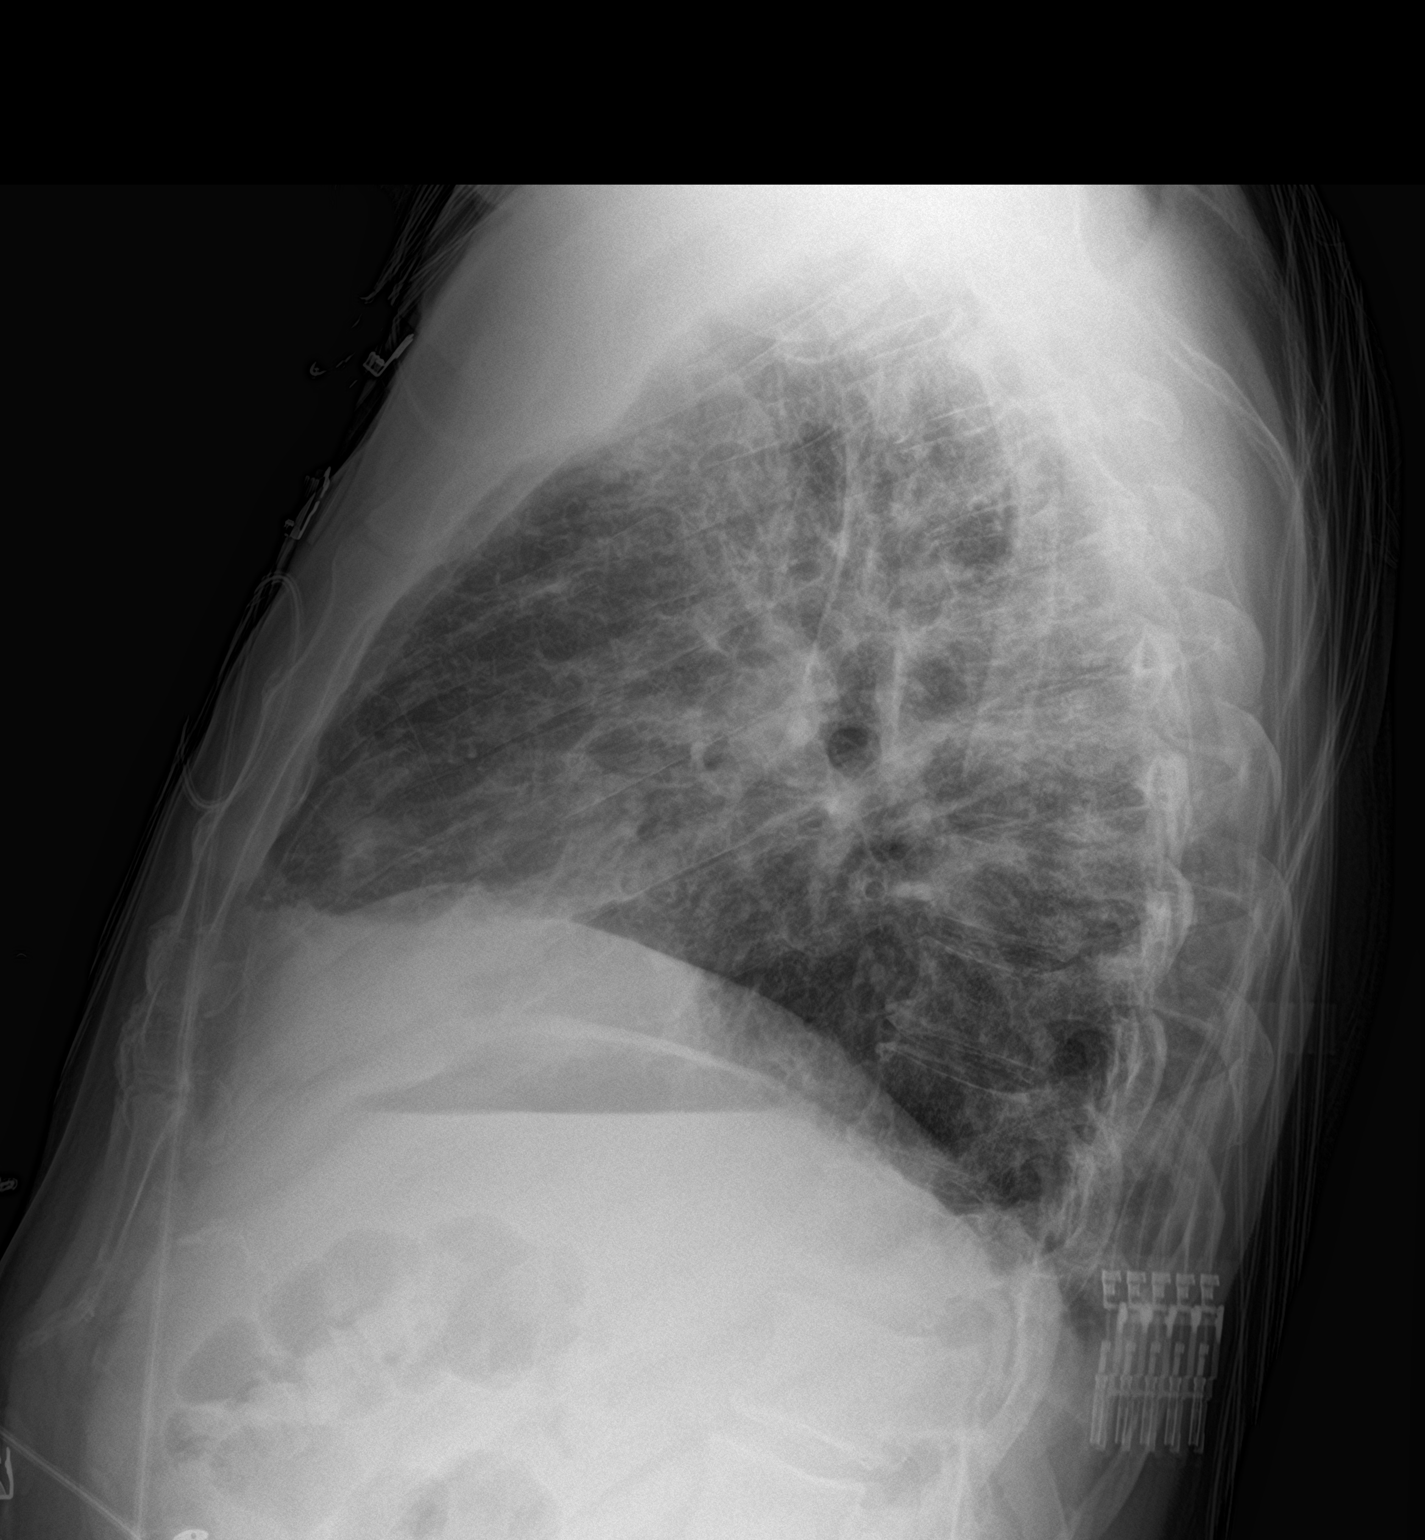

[2 of 2 positions shown; findings below may reference images not displayed]

FINDINGS: The heart size and mediastinal contours are within normal limits.
Coarse pulmonary opacity throughout both lungs is unchanged, and
consistent with scarring and fibrosis. No new or superimposed areas
of pulmonary infiltrate are seen. No evidence of pleural effusion.
IMPRESSION: Stable coarse bilateral pulmonary opacities, consistent with
scarring and fibrosis. No acute findings.

## 2022-02-09 ENCOUNTER — Encounter: Payer: Self-pay | Admitting: *Deleted

## 2022-02-09 NOTE — Research (Unsigned)
Voicemail left to informed him of [facility] research. Encouraged him to call me back.  ?

## 2022-02-16 IMAGING — CR DG CHEST 2V
1 series · 2 of 2 positions shown · non-contrast
Comparison: 12/27/2020 chest radiograph.

CLINICAL DATA: Dyspnea, XFFKV-5L infection in November 2020 with
persistent oxygen requirement

EXAM:
CHEST - 2 VIEW

[Series 1: dg chest 2 view · 0.14mm/px · 2 of 2 slices shown]
[im 1/2]
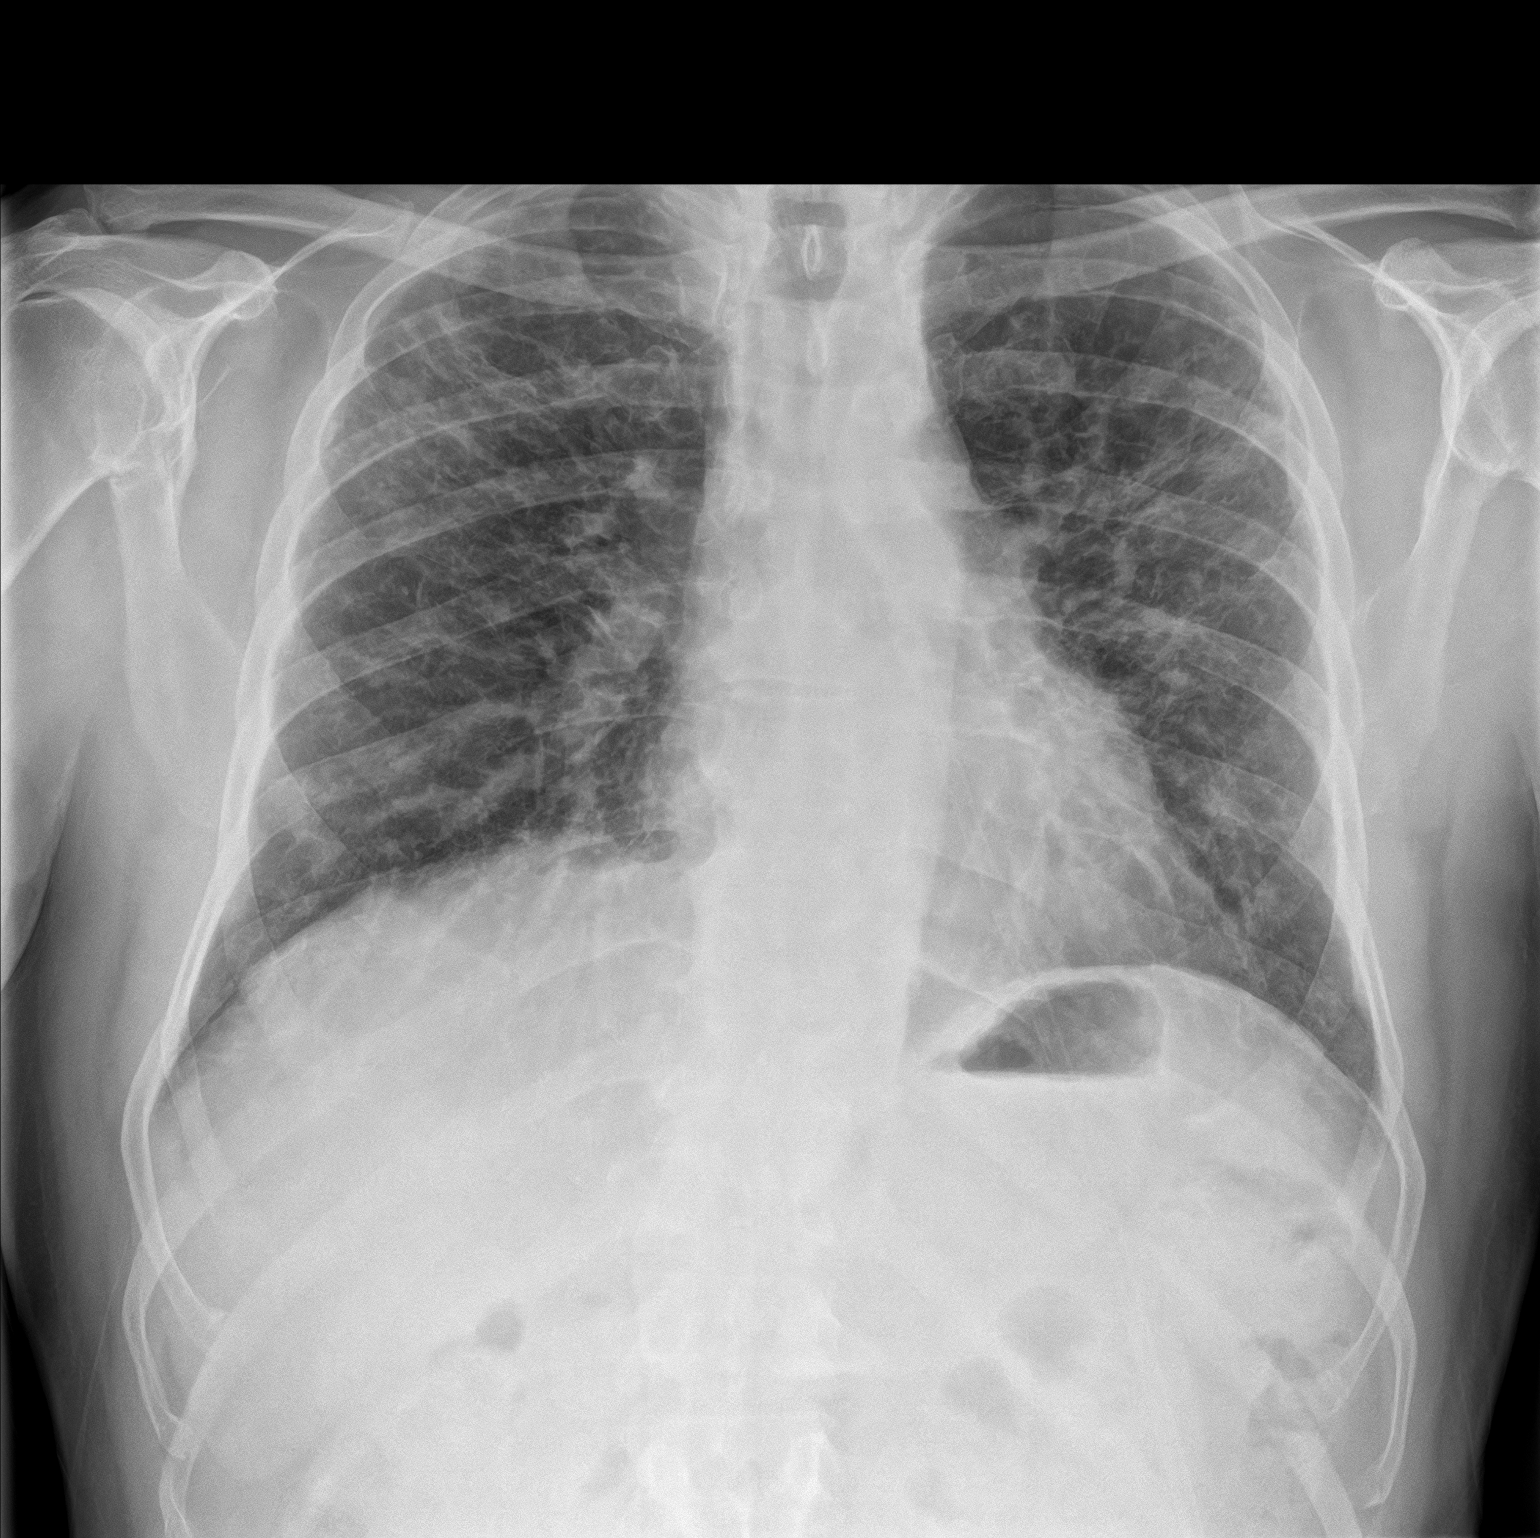
[im 2/2]
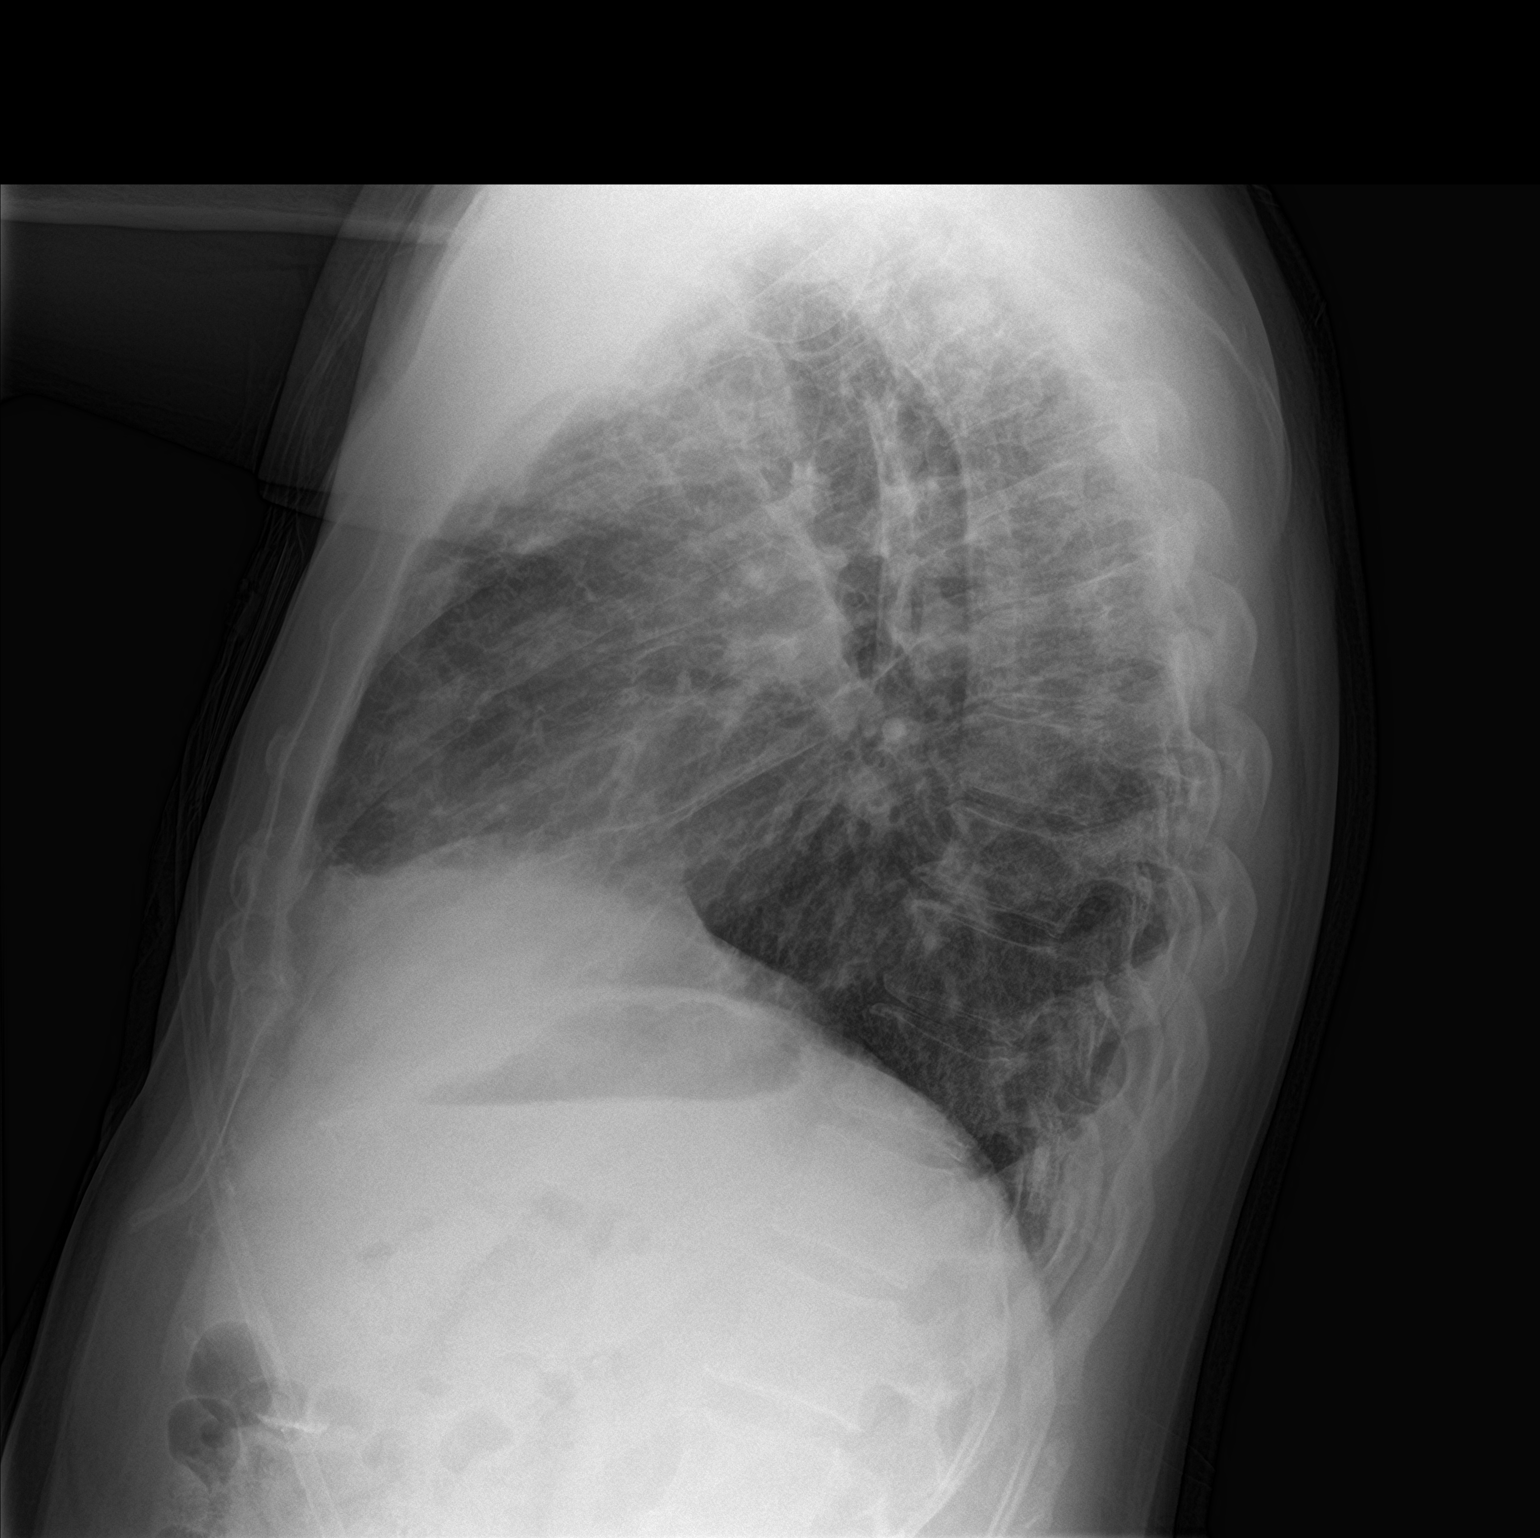

[2 of 2 positions shown; findings below may reference images not displayed]

FINDINGS: Stable cardiomediastinal silhouette with normal heart size. No
pneumothorax. No pleural effusion. Moderate patchy hazy and
reticular opacities throughout both lungs with associated
parenchymal distortion, with slightly decreased hazy opacities and
slightly increased reticular opacities and distortion in the
interval.
IMPRESSION: Pulmonary parenchymal findings suggestive of evolving
postinflammatory fibrosis due to history of XFFKV-5L infection.
Consider follow-up high-resolution chest CT study in 3-6 months, as
clinically warranted.

## 2022-04-06 ENCOUNTER — Telehealth: Payer: Self-pay

## 2022-04-06 NOTE — Telephone Encounter (Signed)
Lm for patient to see if she has had split night sleep study that was ordered at last OV.

## 2022-04-07 NOTE — Telephone Encounter (Signed)
Lm x2 for patient.  Will close encounter per office protocol.   

## 2022-04-08 ENCOUNTER — Ambulatory Visit: Payer: Managed Care, Other (non HMO) | Admitting: Pulmonary Disease

## 2022-04-13 ENCOUNTER — Other Ambulatory Visit: Payer: Self-pay | Admitting: Cardiovascular Disease

## 2022-04-20 ENCOUNTER — Encounter: Payer: Self-pay | Admitting: *Deleted

## 2022-04-20 DIAGNOSIS — Z006 Encounter for examination for normal comparison and control in clinical research program: Secondary | ICD-10-CM

## 2022-04-20 NOTE — Research (Unsigned)
Message left information about Curtis Rosales. Encourage him to call back.

## 2022-05-21 ENCOUNTER — Other Ambulatory Visit: Payer: Self-pay

## 2022-05-21 MED ORDER — ROSUVASTATIN CALCIUM 20 MG PO TABS: 20.0000 mg | ORAL_TABLET | Freq: Every day | ORAL | 3 refills | Status: DC

## 2022-07-07 ENCOUNTER — Other Ambulatory Visit: Payer: Self-pay | Admitting: Radiation Oncology

## 2022-07-07 DIAGNOSIS — J9601 Acute respiratory failure with hypoxia: Secondary | ICD-10-CM

## 2022-07-21 ENCOUNTER — Ambulatory Visit: Payer: Disability Insurance | Attending: Radiation Oncology

## 2022-07-21 DIAGNOSIS — U071 COVID-19: Secondary | ICD-10-CM | POA: Insufficient documentation

## 2022-07-21 DIAGNOSIS — J9601 Acute respiratory failure with hypoxia: Secondary | ICD-10-CM | POA: Insufficient documentation

## 2022-07-21 LAB — PULMONARY FUNCTION TEST ARMC ONLY
DL/VA % pred: 67 %
DL/VA: 2.83 ml/min/mmHg/L
DLCO unc % pred: 57 %
DLCO unc: 16.41 ml/min/mmHg
FEF 25-75 Post: 5.81 L/sec
FEF 25-75 Pre: 5.98 L/sec
FEF2575-%Change-Post: -2 %
FEF2575-%Pred-Post: 189 %
FEF2575-%Pred-Pre: 194 %
FEV1-%Change-Post: 0 %
FEV1-%Pred-Post: 101 %
FEV1-%Pred-Pre: 100 %
FEV1-Post: 3.78 L
FEV1-Pre: 3.77 L
FEV1FVC-%Change-Post: 0 %
FEV1FVC-%Pred-Pre: 119 %
FEV6-%Change-Post: 0 %
FEV6-%Pred-Post: 88 %
FEV6-%Pred-Pre: 88 %
FEV6-Post: 4.18 L
FEV6-Pre: 4.17 L
FEV6FVC-%Pred-Post: 104 %
FEV6FVC-%Pred-Pre: 104 %
FVC-%Change-Post: 0 %
FVC-%Pred-Post: 84 %
FVC-%Pred-Pre: 84 %
FVC-Post: 4.18 L
FVC-Pre: 4.17 L
Post FEV1/FVC ratio: 90 %
Post FEV6/FVC ratio: 100 %
Pre FEV1/FVC ratio: 90 %
Pre FEV6/FVC Ratio: 100 %

## 2022-07-21 MED ORDER — ALBUTEROL SULFATE (2.5 MG/3ML) 0.083% IN NEBU
2.5000 mg | INHALATION_SOLUTION | Freq: Once | RESPIRATORY_TRACT | Status: AC
  Administered 2022-07-21: 2.5 mg via RESPIRATORY_TRACT
  Filled 2022-07-21: qty 3

## 2022-08-07 ENCOUNTER — Ambulatory Visit: Payer: 59 | Admitting: Pulmonary Disease

## 2022-08-07 ENCOUNTER — Encounter: Payer: Self-pay | Admitting: Pulmonary Disease

## 2022-08-07 VITALS — BP 128/80 | HR 66 | Temp 98.1°F | Ht 71.0 in | Wt 208.0 lb

## 2022-08-07 DIAGNOSIS — J8410 Pulmonary fibrosis, unspecified: Secondary | ICD-10-CM

## 2022-08-07 DIAGNOSIS — G4734 Idiopathic sleep related nonobstructive alveolar hypoventilation: Secondary | ICD-10-CM

## 2022-08-07 DIAGNOSIS — R0683 Snoring: Secondary | ICD-10-CM

## 2022-08-07 DIAGNOSIS — Z8616 Personal history of COVID-19: Secondary | ICD-10-CM

## 2022-08-07 NOTE — Patient Instructions (Addendum)
We will reschedule your sleep study.  We are also going to refer you to the pulmonary rehab program I think that this will help you particularly after your COVID issues.  We will see you in follow-up in 3 months time call sooner should any new problems arise.

## 2022-08-07 NOTE — Progress Notes (Signed)
Subjective:    Patient ID: Curtis Rosales, male    DOB: 02-Aug-1961, 61 y.o.   MRN: [id number] Patient Care Team: Marina Goodell, MD as PCP - General (Family Medicine) [name], MD as PCP - Cardiology (Cardiology)  Chief Complaint  Patient presents with   Follow-up    Postinflammatory pulmonary fibrosis. SOB with exertion. No wheezing. Cough in the morning. 2L at night.    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  He also has issues with persistent nocturnal hypoxemia.  The patient was hospitalized at [facility] in mid to late January 2022 for COVID-19 pneumonia and respiratory failure.  He never required BiPAP assistance or ventilator support.  The patient however remained oxygen dependent for a number of months but eventually was able to wean off of oxygen.  Pulmonary function testing performed 12 February 2021 showed moderate restriction and moderate diffusion capacity impairment.  He also did have some small airways component.  After initial visit with me on 12 Mar 2021, he had been weaned off of oxygen.  An overnight oximetry however was ordered and this was performed on 2 June 222.  He was instructed to continue using nocturnal oxygen due to desaturations as low as 85%.  However the desaturation events were numerous and so erratic that a formal sleep study was warranted.  The patient was changing insurances and the sleep study never came to fruition.  He had been on Symbicort to help with secretion mobilization but does not note that this makes an impact, therefore, he discontinued it.  He very rarely uses as needed albuterol.   Today on follow-up he has still some persistent dyspnea on exertion however he notes that he has more stamina.  Even with this though he is limited due to dyspnea.  He continues to note nonrestorative sleep and daytime sleepiness.  Patient's wife states that  she has to wake him up sometimes in the middle of the night because he quits breathing.  He also has significant snoring noted as well.   Patient has had no fevers, chills or sweats.  No chest pain.  No gastroesophageal reflux symptoms.  No nocturnal awakenings.  He voices no other complaint.  Overall he looks well.   Review of Systems A 10 point review of systems was performed and it is as noted above otherwise negative.  Patient Active Problem List   Diagnosis Date Noted   Chronic respiratory failure with hypoxia (HCC) 01/24/2021   Physical deconditioning 01/24/2021   SIRS (systemic inflammatory response syndrome) (HCC) 12/27/2020   Hypotension 12/27/2020   Near syncope 12/27/2020   Sepsis due to pneumonia (HCC) 11/25/2020   Pneumonia due to COVID-19 virus 11/25/2020   Acute respiratory disease due to COVID-19 virus 11/24/2020   Acute on chronic respiratory failure with hypoxia (HCC) 11/24/2020   Hypertriglyceridemia 02/25/2015   Pure hypercholesterolemia 09/14/2014   Acute MI, anterolateral wall, initial episode of care (HCC) 07/12/2012   Cardiomyopathy, ischemic 07/12/2012   Coronary artery disease involving native coronary artery with angina pectoris (HCC) 07/12/2012   Ventricular fibrillation (HCC) 07/08/2012   Social History   Tobacco Use   Smoking status: Never   Smokeless tobacco: Never  Substance Use Topics   Alcohol use: No   Allergies  Allergen Reactions   Ibuprofen Other (See Comments)    Heart issue   Current Meds  Medication Sig   acetaminophen (TYLENOL) 500  MG tablet Take 1,000 mg by mouth every 6 (six) hours as needed for mild pain.   albuterol (VENTOLIN HFA) 108 (90 Base) MCG/ACT inhaler Inhale 2 puffs into the lungs every 4 (four) hours as needed for shortness of breath or wheezing.   aspirin EC 81 MG tablet Take 1 tablet (81 mg total) by mouth daily.   carvedilol (COREG) 3.125 MG tablet Take 1 tablet by mouth twice daily   icosapent Ethyl (VASCEPA) 1 g  capsule Take 2 capsules (2 g total) by mouth 2 (two) times daily.   Multiple Vitamin (MULTIVITAMIN WITH MINERALS) TABS tablet Take 1 tablet by mouth daily.   nitroGLYCERIN (NITROSTAT) 0.4 MG SL tablet Place 1 tablet (0.4 mg total) under the tongue every 5 (five) minutes as needed for chest pain.   omeprazole (PRILOSEC OTC) 20 MG tablet Take 20 mg by mouth daily.   rosuvastatin (CRESTOR) 20 MG tablet Take 1 tablet (20 mg total) by mouth daily.   Immunization History  Administered Date(s) Administered   Influenza,inj,Quad PF,6+ Mos 11/22/2012      Objective:   Physical Exam BP 128/80 (BP Location: Left Arm, Cuff Size: Normal)   Pulse 66   Temp 98.1 F (36.7 C)   Ht 5\' 11"  (1.803 m)   Wt 208 lb (94.3 kg)   SpO2 96%   BMI 29.01 kg/m  GENERAL: Overweight gentleman, no acute distress.  Fully ambulatory.  No conversational dyspnea. HEAD: Normocephalic, atraumatic.  EYES: Pupils equal, round, reactive to light.  No scleral icterus.  MOUTH: Nose/mouth/throat not examined due to masking requirements for COVID 19. NECK: Supple. No thyromegaly. Trachea midline. No JVD.  No adenopathy. PULMONARY: Good air entry bilaterally.  No adventitious sounds. CARDIOVASCULAR: S1 and S2. Regular rate and rhythm.  No rubs, murmurs or gallops heard. ABDOMEN: Obese otherwise benign. MUSCULOSKELETAL: No joint deformity, no clubbing, no edema.  NEUROLOGIC: No focal deficit, no gait disturbance, speech is fluent. SKIN: Intact,warm,dry.  On limited exam no rashes. PSYCH: Mood and behavior normal.        01/06/2022    4:00 PM  Results of the Epworth flowsheet  Sitting and reading 3  Watching TV 3  Sitting, inactive in a public place (e.g. a theatre or a meeting) 1  As a passenger in a car for an hour without a break 1  Lying down to rest in the afternoon when circumstances permit 3  Sitting and talking to someone 0  Sitting quietly after a lunch without alcohol 1  In a car, while stopped for a few minutes  in traffic 0  Total score 12       Assessment & Plan:     ICD-10-CM   1. Postinflammatory pulmonary fibrosis (HCC)  J84.10 AMB referral to pulmonary rehabilitation    Split night study   Continued issues with dyspnea Post COVID-19 Pulmonary rehab    2. Loud snoring and apneas  R06.83 AMB referral to pulmonary rehabilitation    Split night study   Formal sleep study In lab    3. Nocturnal hypoxemia  G47.34 AMB referral to pulmonary rehabilitation    Split night study   Continue oxygen at 2 L/min Patient compliant with therapy Patient notes benefit of therapy    4. History of 2019 novel coronavirus disease (COVID-19)  Z86.16 AMB referral to pulmonary rehabilitation    Split night study   This issue adds complexity to his management Persistent dyspnea post disease Pulmonary rehab     Orders Placed This  Encounter  Procedures   AMB referral to pulmonary rehabilitation    Referral Priority:   Routine    Referral Type:   Consultation    Number of Visits Requested:   1   Split night study    Standing Status:   Future    Standing Expiration Date:   08/08/2023    Order Specific Question:   Where should this test be performed:    Answer:   [place]   See the patient in follow-up in 3 months time he is to call sooner should any new problems arise.  Gailen Shelter, MD Advanced Bronchoscopy PCCM [facility]    *This note was dictated using voice recognition software/Dragon.  Despite best efforts to proofread, errors can occur which can change the meaning. Any transcriptional errors that result from this process are unintentional and may not be fully corrected at the time of dictation.

## 2022-09-28 ENCOUNTER — Ambulatory Visit: Payer: 59 | Attending: Otolaryngology

## 2022-09-28 DIAGNOSIS — Z6829 Body mass index (BMI) 29.0-29.9, adult: Secondary | ICD-10-CM | POA: Diagnosis not present

## 2022-09-28 DIAGNOSIS — G4733 Obstructive sleep apnea (adult) (pediatric): Secondary | ICD-10-CM | POA: Diagnosis not present

## 2022-09-28 DIAGNOSIS — J9611 Chronic respiratory failure with hypoxia: Secondary | ICD-10-CM | POA: Insufficient documentation

## 2022-09-28 DIAGNOSIS — R0683 Snoring: Secondary | ICD-10-CM | POA: Diagnosis present

## 2022-12-08 ENCOUNTER — Other Ambulatory Visit: Payer: Self-pay | Admitting: Cardiovascular Disease

## 2023-01-07 ENCOUNTER — Encounter: Payer: Self-pay | Admitting: Pulmonary Disease

## 2023-01-07 ENCOUNTER — Ambulatory Visit: Payer: 59 | Admitting: Pulmonary Disease

## 2023-01-07 VITALS — BP 124/84 | HR 67 | Temp 97.5°F | Ht 71.0 in | Wt 215.2 lb

## 2023-01-07 DIAGNOSIS — G4734 Idiopathic sleep related nonobstructive alveolar hypoventilation: Secondary | ICD-10-CM | POA: Diagnosis not present

## 2023-01-07 DIAGNOSIS — J8410 Pulmonary fibrosis, unspecified: Secondary | ICD-10-CM | POA: Diagnosis not present

## 2023-01-07 DIAGNOSIS — G4730 Sleep apnea, unspecified: Secondary | ICD-10-CM

## 2023-01-07 NOTE — Progress Notes (Signed)
Subjective:    Patient ID: Curtis Rosales, male    DOB: 1961-01-16, 62 y.o.   MRN: [id number] Patient Care Team: Sofie Hartigan, MD as PCP - General (Family Medicine) Sherren Mocha, MD as PCP - Cardiology (Cardiology)  Chief Complaint  Patient presents with   Follow-up    SOB with exertion. No wheezing or cough.    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  He also has issues with persistent nocturnal hypoxemia.  The patient was hospitalized at [facility] in mid to late January 2022 for COVID-19 pneumonia and respiratory failure.  He never required BiPAP assistance or ventilator support.  The patient however remained oxygen dependent for a number of months but eventually was able to wean off of oxygen.  Pulmonary function testing performed 12 February 2021 showed moderate restriction and moderate diffusion capacity impairment.  He also did have some small airways component.  After initial visit with me on 12 Mar 2021, he was weaned off of oxygen.  An overnight oximetry however was ordered and this was performed on 2 June 222.  He was instructed to continue using nocturnal oxygen due to desaturations as low as 85%.  However the desaturation events were numerous and so erratic that a formal sleep study was warranted.  The patient was changing insurances and the sleep study never came to fruition.  He did been on Symbicort but did not note that this made an impact so this was discontinued.  He very rarely uses as needed albuterol.   Today on follow-up he has still some persistent dyspnea on exertion however he notes that he has more stamina.  He had a sleep study performed 30 September 2022 and had very mild sleep apnea with an AHI of 5.3.  He does have some PLMS.  O2 nadir was 86% (oxygen was not used during the study).   Patient has had no fevers, chills or sweats.  No chest pain.  No  gastroesophageal reflux symptoms.  No nocturnal awakenings.  He voices no other complaint.  Overall he feels well and looks well.   DATA 12/27/2020 CT angio chest: No evidence of pulmonary embolism, subcentimeter mediastinal hilar nodes, no pneumothorax or pleural effusion widespread coarse bandlike densities with groundglass density felt most suggestive of postinfectious/postinflammatory scarring 06/05/2021 PFTs: FEV1 3.51 L or 92% predicted, FVC 3.84 L or 77% predicted, FEV1/FVC 91%, no bronchodilator response, mild restriction diffusion capacity moderately reduced 09/30/2022 sleep study: AHI 5.3 events per hour P O2 nadir 86%, no oxygen use during the testing  Review of Systems A 10 point review of systems was performed and it is as noted above otherwise negative.  Patient Active Problem List   Diagnosis Date Noted   Chronic respiratory failure with hypoxia ([place]) 01/24/2021   Physical deconditioning 01/24/2021   SIRS (systemic inflammatory response syndrome) ([place]) 12/27/2020   Hypotension 12/27/2020   Near syncope 12/27/2020   Sepsis due to pneumonia ([place]) 11/25/2020   Pneumonia due to COVID-19 virus 11/25/2020   Acute respiratory disease due to COVID-19 virus 11/24/2020   Acute on chronic respiratory failure with hypoxia ([place]) 11/24/2020   Hypertriglyceridemia 02/25/2015   Pure hypercholesterolemia 09/14/2014   Acute MI, anterolateral wall, initial episode of care [facility]) 07/12/2012   Cardiomyopathy, ischemic 07/12/2012   Coronary artery disease involving native coronary artery with angina pectoris ([place]) 07/12/2012   Ventricular fibrillation ([place]) 07/08/2012   Social History  Tobacco Use   Smoking status: Never   Smokeless tobacco: Never  Substance Use Topics   Alcohol use: No   Allergies  Allergen Reactions   Ibuprofen Other (See Comments)    Heart issue   Current Meds  Medication Sig   acetaminophen (TYLENOL) 500 MG tablet Take 1,000 mg by mouth every 6 (six) hours as  needed for mild pain.   albuterol (VENTOLIN HFA) 108 (90 Base) MCG/ACT inhaler Inhale 2 puffs into the lungs every 4 (four) hours as needed for shortness of breath or wheezing.   aspirin EC 81 MG tablet Take 1 tablet (81 mg total) by mouth daily.   carvedilol (COREG) 3.125 MG tablet Take 1 tablet by mouth twice daily   icosapent Ethyl (VASCEPA) 1 g capsule Take 2 capsules (2 g total) by mouth 2 (two) times daily.   Multiple Vitamin (MULTIVITAMIN WITH MINERALS) TABS tablet Take 1 tablet by mouth daily.   nitroGLYCERIN (NITROSTAT) 0.4 MG SL tablet DISSOLVE ONE TABLET UNDER THE TONGUE EVERY 5 MINUTES AS NEEDED FOR CHEST PAIN.  DO NOT EXCEED A TOTAL OF 3 DOSES IN 15 MINUTES   omeprazole (PRILOSEC OTC) 20 MG tablet Take 20 mg by mouth daily.   rosuvastatin (CRESTOR) 20 MG tablet Take 1 tablet (20 mg total) by mouth daily.   Immunization History  Administered Date(s) Administered   Influenza,inj,Quad PF,6+ Mos 11/22/2012       Objective:   Physical Exam BP 124/84 (BP Location: Left Arm, Cuff Size: Large)   Pulse 67   Temp (!) 97.5 F (36.4 C)   Ht '5\' 11"'$  (1.803 m)   Wt 215 lb 3.2 oz (97.6 kg)   SpO2 96%   BMI 30.01 kg/m   SpO2: 96 % O2 Device: None (Room air)  GENERAL: Overweight gentleman, no acute distress.  Fully ambulatory.  No conversational dyspnea. HEAD: Normocephalic, atraumatic.  EYES: Pupils equal, round, reactive to light.  No scleral icterus.  MOUTH: Native teeth, poor dentition overall with missing teeth, chipped.  Oral mucosa moist.   NECK: Supple. No thyromegaly. Trachea midline. No JVD.  No adenopathy. PULMONARY: Good air entry bilaterally.  No adventitious sounds. CARDIOVASCULAR: S1 and S2. Regular rate and rhythm.  No rubs, murmurs or gallops heard. ABDOMEN: Obese, otherwise benign. MUSCULOSKELETAL: No joint deformity, no clubbing, no edema.  NEUROLOGIC: No focal deficit, no gait disturbance, speech is fluent. SKIN: Intact,warm,dry.  On limited exam no  rashes. PSYCH: Mood and behavior normal.   Ambulatory oximetry was performed today: Resting heart rate was 90 bpm, resting O2 sat 97%, at peak exercise oxygen nadir was 95% maximum heart rate was 86 bpm.  Patient ambulated at moderate pace without sensation of dyspnea.    Assessment & Plan:     ICD-10-CM   1. Postinflammatory pulmonary fibrosis (HCC)  J84.10 Pulmonary Function Test [facility] Only   Reassess with PFTs    2. Mild sleep apnea  G47.30 Ambulatory referral to Dentistry   AHI of only 5.3 Trial of oral appliance    3. Nocturnal hypoxemia  G47.34    Continue oxygen at 2 L/min This is secondary to postinflammatory pulmonary fibrosis     Orders Placed This Encounter  Procedures   Ambulatory referral to Dentistry    Referral Priority:   Routine    Referral Type:   Consultation    Referral Reason:   Specialty Services Required    Requested Specialty:   Dental General Practice    Number of Visits Requested:  1   Pulmonary Function Test [facility] Only    Standing Status:   Future    Standing Expiration Date:   07/10/2023    Order Specific Question:   Full PFT: includes the following: basic spirometry, spirometry pre & post bronchodilator, diffusion capacity (DLCO), lung volumes    Answer:   Full PFT    Order Specific Question:   This test can only be performed at    Answer:   [facility]   Will see the patient in follow-up in 4 to 6 months time he is to contact us prior to that time should any new difficulties arise.  Renold Don, MD Advanced Bronchoscopy PCCM [facility]    *This note was dictated using voice recognition software/Dragon.  Despite best efforts to proofread, errors can occur which can change the meaning. Any transcriptional errors that result from this process are unintentional and may not be fully corrected at the time of dictation.

## 2023-01-07 NOTE — Patient Instructions (Signed)
We are going to get a breathing test.  You did well with your walk test.  We are referring you to the dentist for a dental appliance for your sleep apnea.  Your sleep apnea is very mild, we also recommend weight loss of at least 10 to 15 pounds of weight.  We will see you in follow-up in 4 to 6 months time call sooner should any new problems arise.

## 2023-01-20 ENCOUNTER — Other Ambulatory Visit: Payer: Self-pay | Admitting: Cardiovascular Disease

## 2023-01-26 ENCOUNTER — Telehealth: Payer: Self-pay | Admitting: Cardiovascular Disease

## 2023-01-26 MED ORDER — CARVEDILOL 3.125 MG PO TABS: 3.1250 mg | ORAL_TABLET | Freq: Two times a day (BID) | ORAL | 0 refills | Status: DC

## 2023-01-26 NOTE — Telephone Encounter (Signed)
Returned call to patient's wife Curtis Rosales ([place] per [facility]).  She states they had just filled Rx for carvedilol last week and patient has lost the bottle, unable to find it anywhere.  Requesting refill be sent to [facility]. Curtis Rosales aware they will have to pay OOP for this refill.  Curtis Rosales verbalized understanding.

## 2023-01-26 NOTE — Telephone Encounter (Signed)
Pt c/o medication issue:  1. Name of Medication: carvedilol (COREG) 3.125 MG tablet   2. How are you currently taking this medication (dosage and times per day)? As prescribed   3. Are you having a reaction (difficulty breathing--STAT)?   4. What is your medication issue? Pt's wife states that they have lost the last bottle of this medication and would like another prescription called in until their upcoming appt.

## 2023-02-09 NOTE — Progress Notes (Signed)
Cardiology Office Note:    Date:  02/23/2023   ID:  Curtis Rosales, DOB Apr 22, 1961, MRN [id number]  PCP:  Marina Goodell, MD  [facility] Providers Cardiologist:  [name], MD     Referring MD: Marina Goodell, MD   Chief Complaint:  Follow-up     History of Present Illness:   Curtis Rosales is a 62 y.o. 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 He has done well over the last decade but at a recent office visit admitted to recurrent anginal symptoms. A stress Myoview scan showed inferior infarct with ischemia.      Cardiac cath 12/2022  Stable coronary anatomy. Continue medical therapy. The patient stress Myoview which showed inferior ischemia is likely related to artifact as there is no significant coronary stenosis in this distribution. The ostial diagonal lesion is unchanged and in fact somewhat improved from the patient's index procedure 10 years ago. See below for details.  Patient comes in for f/u. Denies chest pain. Occasionally notices some leg swelling when on his feet all day. Works as a Curator. Left heel hurts. He's borderline diabetic. Labs reviewed in care everywhere from 08/2022. Never received vascepa due to cost but they changed insurance now.     Past Medical History:  Diagnosis Date   Ascending aorta dilatation    Echo 4/22: 39 mm // Chest CT 2/22: "nonaneurysmal aorta"   CAD (coronary artery disease)    a. 07/2012 Anterolateral MI/Cath: LAD 100 100%-> 3.0x16 Promus Element DES, otw nonobs dzs, EF 30%   Cardiac arrest    DM2 (diabetes mellitus, type 2)    History of COVID-19    prolonged hospitalization c/b pneumonia, pneumomediastinum   HLD (hyperlipidemia)    HTN (hypertension)    Ischemic cardiomyopathy    a. 07/2012 Echo: EF 25-30% w/ mid ant, basal inf, sept Sev HK & apical, dist post, mid/dist inf, dist ant, dist lat AK. Gr 1 DD, mild MR. // Echo 3/14: EF 50-55 // Echo 4/22: EF 50-55,  anteroseptal and inferoseptal HK, moderate asymmetric LVH, GR 1 DD, normal RVSF, trivial MR, mild dilation of ascending aorta (39 mm)    MI (myocardial infarction)    VF (ventricular fibrillation)    a. in setting of MI 07/2012   Current Medications: Current Meds  Medication Sig   acetaminophen (TYLENOL) 500 MG tablet Take 1,000 mg by mouth every 6 (six) hours as needed for mild pain.   albuterol (VENTOLIN HFA) 108 (90 Base) MCG/ACT inhaler Inhale 2 puffs into the lungs every 4 (four) hours as needed for shortness of breath or wheezing.   aspirin EC 81 MG tablet Take 1 tablet (81 mg total) by mouth daily.   carvedilol (COREG) 3.125 MG tablet Take 1 tablet (3.125 mg total) by mouth 2 (two) times daily. Please keep appointment with Herma Carson, PA-C on 02/23/23 for future refills.   Multiple Vitamin (MULTIVITAMIN WITH MINERALS) TABS tablet Take 1 tablet by mouth daily.   omeprazole (PRILOSEC OTC) 20 MG tablet Take 20 mg by mouth daily.   [DISCONTINUED] icosapent Ethyl (VASCEPA) 1 g capsule Take 2 capsules (2 g total) by mouth 2 (two) times daily.   [DISCONTINUED] nitroGLYCERIN (NITROSTAT) 0.4 MG SL tablet DISSOLVE ONE TABLET UNDER THE TONGUE EVERY 5 MINUTES AS NEEDED FOR CHEST PAIN.  DO NOT EXCEED A TOTAL OF 3 DOSES IN 15 MINUTES   [DISCONTINUED] rosuvastatin (CRESTOR) 20 MG tablet Take  1 tablet (20 mg total) by mouth daily.    Allergies:   Ibuprofen   Social History   Tobacco Use   Smoking status: Never   Smokeless tobacco: Never  Vaping Use   Vaping Use: Never used  Substance Use Topics   Alcohol use: No   Drug use: No    Family Hx: The patient's family history includes Heart attack (age of onset: 67) in an other family member; Heart attack (age of onset: 46) in his sister; Heart attack (age of onset: 42) in his father.  ROS     Physical Exam:    VS:  BP 112/78   Pulse 70   Ht 5\' 11"  (1.803 m)   Wt 211 lb 3.2 oz (95.8 kg)   SpO2 98%   BMI 29.46 kg/m     Wt Readings  from Last 3 Encounters:  02/23/23 211 lb 3.2 oz (95.8 kg)  01/07/23 215 lb 3.2 oz (97.6 kg)  08/07/22 208 lb (94.3 kg)    Physical Exam  GEN: Well nourished, well developed, in no acute distress  Neck: no JVD, carotid bruits, or masses Cardiac:RRR; no murmurs, rubs, or gallops  Respiratory:  clear to auscultation bilaterally, normal work of breathing GI: soft, nontender, nondistended, + BS Ext: without cyanosis, clubbing, or edema, Good distal pulses bilaterally Neuro:  Alert and Oriented x 3, Psych: euthymic mood, full affect        EKGs/Labs/Other Test Reviewed:    EKG:  EKG is   ordered today.  The ekg ordered today demonstrates NSR normal EKG  Recent Labs: No results found for requested labs within last 365 days.   Recent Lipid Panel No results for input(s): "CHOL", "TRIG", "HDL", "VLDL", "LDLCALC", "LDLDIRECT" in the last 8760 hours.   Prior CV Studies:     Cath 12/2021: 1.  Right dominant coronary artery with mild mid RCA plaquing, mild distal RCA stenosis, and mild proximal PDA stenosis.  No obstructive disease throughout the RCA distribution. 2.  Widely patent left main, left circumflex, and ramus intermedius. 3.  Continued patency of the proximal LAD stented segment with mild diffuse 30% in-stent restenosis and mild diffuse nonobstructive plaquing throughout the LAD 4.  Moderate to severe ostial diagonal stenosis, vessel jailed by proximal LAD stent, unchanged from index procedure in 2013.   Recommendations: Stable coronary anatomy.  Continue medical therapy.  The patient stress Myoview which showed inferior ischemia is likely related to artifact as there is no significant coronary stenosis in this distribution.  The ostial diagonal lesion is unchanged and in fact somewhat improved from the patient's index procedure 10 years ago.     Echocardiogram 02/24/2021:  1. Left ventricular ejection fraction, by estimation, is 50 to 55%. The  left ventricle has normal function.  The left ventricle demonstrates  regional wall motion abnormalities (see scoring diagram/findings for  description). Focal akinesis of mid  anteroseptal/inferoseptal walls with overall normal systolic function.  There is moderate asymmetric left ventricular hypertrophy of the  basal-septal segment. Left ventricular diastolic parameters are consistent  with Grade I diastolic dysfunction  (impaired relaxation).   2. Right ventricular systolic function is normal. The right ventricular  size is normal. Tricuspid regurgitation signal is inadequate for assessing  PA pressure.   3. The mitral valve is normal in structure. Trivial mitral valve  regurgitation.   4. The aortic valve is tricuspid. Aortic valve regurgitation is not  visualized. No aortic stenosis is present.   5. Aortic dilatation noted. There  is mild dilatation of the ascending  aorta, measuring 39 mm.      Exercise stress Myoview scan 01/28/2017: Pt walked for 8:30 of a Bruce protocol GXt. Peak HR of 153 which is 93% predicted maximal HR There were no ST or T wave changes to suggest ischemia. This is a low risk study. Nuclear stress EF: 54%. The left ventricular ejection fraction is normal (55-65%). The study is normal.   Event monitor 02/24/21: The basic rhythm is normal sinus with an average HR of 84 bpm No atrial fibrillation or flutter No high-grade heart block or pathologic pauses There are occasional PVC's and occasional supraventricular beats without sustained arrhythmias   Overall there are no significant arrhythmias identified     Risk Assessment/Calculations/Metrics:              ASSESSMENT & PLAN:   No problem-specific Assessment & Plan notes found for this encounter.    CAD and remote anterior MI complicated by cardiac arrest in 2013.  recurrent anginal symptoms. A stress Myoview scan showed inferior infarct with ischemia. Cath 12/2021 patent LAD stent mod-severe ostial Dx jailed by pLAD stent unchanged.  Inf ischemia felt to be artifact.  No recent angina. Continue ASA/coreg/Crestor. 150 min exercise weekly.  HTN BP controlled  HLD LDL 36 trig 281 labs 08/2022. Will reorder vascepa-he has new insurance that will hopefully cover the cost.          Dispo:  No follow-ups on file.   Medication Adjustments/Labs and Tests Ordered: Current medicines are reviewed at length with the patient today.  Concerns regarding medicines are outlined above.  Tests Ordered: Orders Placed This Encounter  Procedures   EKG 12-Lead   Medication Changes: Meds ordered this encounter  Medications   icosapent Ethyl (VASCEPA) 1 g capsule    Sig: Take 2 capsules (2 g total) by mouth 2 (two) times daily.    Dispense:  120 capsule    Refill:  11   rosuvastatin (CRESTOR) 20 MG tablet    Sig: Take 1 tablet (20 mg total) by mouth daily.    Dispense:  90 tablet    Refill:  3   nitroGLYCERIN (NITROSTAT) 0.4 MG SL tablet    Sig: DISSOLVE ONE TABLET UNDER THE TONGUE EVERY 5 MINUTES AS NEEDED FOR CHEST PAIN.  DO NOT EXCEED A TOTAL OF 3 DOSES IN 15 MINUTES    Dispense:  25 tablet    Refill:  5    Please call our office to schedule an overdue appointment with Dr. Elease HashimotoNahser before anymore refills. [phone]. Thank you 1st attempt   Signed, Jacolyn ReedyMichele Emersen Carroll, PA-C  02/23/2023 11:52 AM    [facility] [address], [place], [place]  [address] Phone: [phone]; Fax: [phone]

## 2023-02-19 ENCOUNTER — Encounter: Payer: Self-pay | Admitting: Pulmonary Disease

## 2023-02-23 ENCOUNTER — Encounter: Payer: Self-pay | Admitting: Physician Assistant

## 2023-02-23 ENCOUNTER — Ambulatory Visit: Payer: 59 | Attending: Physician Assistant | Admitting: Physician Assistant

## 2023-02-23 VITALS — BP 112/78 | HR 70 | Ht 71.0 in | Wt 211.2 lb

## 2023-02-23 DIAGNOSIS — I2510 Atherosclerotic heart disease of native coronary artery without angina pectoris: Secondary | ICD-10-CM | POA: Diagnosis not present

## 2023-02-23 DIAGNOSIS — E7849 Other hyperlipidemia: Secondary | ICD-10-CM

## 2023-02-23 DIAGNOSIS — I10 Essential (primary) hypertension: Secondary | ICD-10-CM

## 2023-02-23 MED ORDER — NITROGLYCERIN 0.4 MG SL SUBL: SUBLINGUAL_TABLET | SUBLINGUAL | 5 refills | Status: DC

## 2023-02-23 MED ORDER — ICOSAPENT ETHYL 1 G PO CAPS: 2.0000 g | ORAL_CAPSULE | Freq: Two times a day (BID) | ORAL | 11 refills | Status: DC

## 2023-02-23 MED ORDER — ROSUVASTATIN CALCIUM 20 MG PO TABS: 20.0000 mg | ORAL_TABLET | Freq: Every day | ORAL | 3 refills | Status: DC

## 2023-02-23 NOTE — Patient Instructions (Signed)
Medication Instructions:  Your physician recommends that you continue on your current medications as directed. Please refer to the Current Medication list given to you today.  *If you need a refill on your cardiac medications before your next appointment, please call your pharmacy*   Lab Work: NONE If you have labs (blood work) drawn today and your tests are completely normal, you will receive your results only by: MyChart Message (if you have MyChart) OR A paper copy in the mail If you have any lab test that is abnormal or we need to change your treatment, we will call you to review the results.   Testing/Procedures: NONE   Follow-Up: At [facility], you and your health needs are our priority.  As part of our continuing mission to provide you with exceptional heart care, we have created designated Provider Care Teams.  These Care Teams include your primary Cardiologist (physician) and Advanced Practice Providers (APPs -  Physician Assistants and Nurse Practitioners) who all work together to provide you with the care you need, when you need it.  We recommend signing up for the patient portal called "MyChart".  Sign up information is provided on this After Visit Summary.  MyChart is used to connect with patients for Virtual Visits (Telemedicine).  Patients are able to view lab/test results, encounter notes, upcoming appointments, etc.  Non-urgent messages can be sent to your provider as well.   To learn more about what you can do with MyChart, go to [email or web address].    Your next appointment:   1 year(s)  Provider:   [name], MD     Other Instructions YOUR PROVIDER RECOMMENDS THAT YOU ARE EXERCISING AT LEAST 150 MINUTES A WEEK

## 2023-02-25 ENCOUNTER — Other Ambulatory Visit: Payer: Self-pay | Admitting: Cardiovascular Disease

## 2024-03-18 ENCOUNTER — Other Ambulatory Visit: Payer: Self-pay | Admitting: Physician Assistant

## 2024-03-22 ENCOUNTER — Other Ambulatory Visit: Payer: Self-pay | Admitting: Physician Assistant

## 2024-04-21 ENCOUNTER — Other Ambulatory Visit: Payer: Self-pay | Admitting: Cardiovascular Disease

## 2024-04-25 ENCOUNTER — Other Ambulatory Visit: Payer: Self-pay | Admitting: Physician Assistant

## 2024-04-25 ENCOUNTER — Other Ambulatory Visit: Payer: Self-pay | Admitting: Cardiovascular Disease

## 2024-05-16 ENCOUNTER — Other Ambulatory Visit: Payer: Self-pay | Admitting: Physician Assistant

## 2024-06-08 ENCOUNTER — Telehealth: Payer: Self-pay | Admitting: Cardiovascular Disease

## 2024-06-08 MED ORDER — CARVEDILOL 3.125 MG PO TABS: 3.1250 mg | ORAL_TABLET | Freq: Two times a day (BID) | ORAL | 1 refills | Status: DC

## 2024-06-08 MED ORDER — ICOSAPENT ETHYL 1 G PO CAPS: 2.0000 g | ORAL_CAPSULE | Freq: Two times a day (BID) | ORAL | 1 refills | Status: AC

## 2024-06-08 NOTE — Telephone Encounter (Signed)
*  STAT* If patient is at the pharmacy, call can be transferred to refill team.   1. Which medications need to be refilled? (please list name of each medication and dose if known)   carvedilol  (COREG ) 3.125 MG tablet  icosapent  Ethyl (VASCEPA ) 1 g capsule   2. Would you like to learn more about the convenience, safety, & potential cost savings by using the [facility]?   3. Are you open to using the [facility] (Type [facility]. ).  4. Which pharmacy/location (including street and city if local pharmacy) is medication to be sent to?  [id number] - [place], [place] - [address] [address]   5. Do they need a 30 day or 90 day supply?   Wife Giles) stated patient is completely out of these medications.  Patient has an appointment scheduled on 12/1 with Dr. Wonda.

## 2024-06-09 ENCOUNTER — Other Ambulatory Visit: Payer: Self-pay | Admitting: Cardiovascular Disease

## 2024-06-12 ENCOUNTER — Encounter: Payer: Self-pay | Admitting: Gastroenterology

## 2024-06-12 ENCOUNTER — Ambulatory Visit
Admission: RE | Admit: 2024-06-12 | Discharge: 2024-06-12 | Disposition: A | Discharge status: Home / Self Care | Payer: Self-pay | Attending: Gastroenterology | Admitting: Gastroenterology

## 2024-06-12 ENCOUNTER — Ambulatory Visit: Payer: Self-pay

## 2024-06-12 ENCOUNTER — Ambulatory Visit: Admit: 2024-06-12 | Admitting: Gastroenterology

## 2024-06-12 ENCOUNTER — Encounter
Admission: RE | Disposition: A | Discharge status: Home / Self Care | Payer: Self-pay | Source: Home / Self Care | Attending: Gastroenterology

## 2024-06-12 DIAGNOSIS — E119 Type 2 diabetes mellitus without complications: Secondary | ICD-10-CM | POA: Insufficient documentation

## 2024-06-12 DIAGNOSIS — K449 Diaphragmatic hernia without obstruction or gangrene: Secondary | ICD-10-CM | POA: Insufficient documentation

## 2024-06-12 DIAGNOSIS — Z7982 Long term (current) use of aspirin: Secondary | ICD-10-CM | POA: Insufficient documentation

## 2024-06-12 DIAGNOSIS — K2289 Other specified disease of esophagus: Secondary | ICD-10-CM | POA: Insufficient documentation

## 2024-06-12 DIAGNOSIS — I10 Essential (primary) hypertension: Secondary | ICD-10-CM | POA: Insufficient documentation

## 2024-06-12 DIAGNOSIS — D120 Benign neoplasm of cecum: Secondary | ICD-10-CM | POA: Insufficient documentation

## 2024-06-12 DIAGNOSIS — Z79899 Other long term (current) drug therapy: Secondary | ICD-10-CM | POA: Insufficient documentation

## 2024-06-12 DIAGNOSIS — K31A19 Gastric intestinal metaplasia without dysplasia, unspecified site: Secondary | ICD-10-CM | POA: Insufficient documentation

## 2024-06-12 DIAGNOSIS — K219 Gastro-esophageal reflux disease without esophagitis: Secondary | ICD-10-CM | POA: Insufficient documentation

## 2024-06-12 DIAGNOSIS — R1314 Dysphagia, pharyngoesophageal phase: Secondary | ICD-10-CM | POA: Insufficient documentation

## 2024-06-12 DIAGNOSIS — R1084 Generalized abdominal pain: Secondary | ICD-10-CM | POA: Insufficient documentation

## 2024-06-12 DIAGNOSIS — R194 Change in bowel habit: Secondary | ICD-10-CM | POA: Insufficient documentation

## 2024-06-12 DIAGNOSIS — K5730 Diverticulosis of large intestine without perforation or abscess without bleeding: Secondary | ICD-10-CM | POA: Insufficient documentation

## 2024-06-12 HISTORY — PX: POLYPECTOMY: SHX149

## 2024-06-12 HISTORY — PX: ESOPHAGOGASTRODUODENOSCOPY: SHX5428

## 2024-06-12 HISTORY — PX: COLONOSCOPY: SHX5424

## 2024-06-12 LAB — GLUCOSE, CAPILLARY: Glucose-Capillary: 127 mg/dL — ABNORMAL HIGH (ref 70–99)

## 2024-06-12 SURGERY — COLONOSCOPY
Anesthesia: General

## 2024-06-12 MED ORDER — PHENYLEPHRINE 80 MCG/ML (10ML) SYRINGE FOR IV PUSH (FOR BLOOD PRESSURE SUPPORT)
PREFILLED_SYRINGE | INTRAVENOUS | Status: DC | PRN
  Administered 2024-06-12 (×2): 160 ug via INTRAVENOUS

## 2024-06-12 MED ORDER — LIDOCAINE HCL (PF) 2 % IJ SOLN
INTRAMUSCULAR | Status: DC | PRN
  Administered 2024-06-12 (×2): 100 mg via INTRADERMAL

## 2024-06-12 MED ORDER — SODIUM CHLORIDE 0.9 % IV SOLN
INTRAVENOUS | Status: DC
  Administered 2024-06-12 (×2): 20 mL/h via INTRAVENOUS

## 2024-06-12 MED ORDER — PROPOFOL 500 MG/50ML IV EMUL
INTRAVENOUS | Status: DC | PRN
  Administered 2024-06-12: 150 mg via INTRAVENOUS
  Administered 2024-06-12: 200 ug/kg/min via INTRAVENOUS
  Administered 2024-06-12: 150 mg via INTRAVENOUS
  Administered 2024-06-12: 200 ug/kg/min via INTRAVENOUS

## 2024-06-12 NOTE — Op Note (Signed)
 [facility] Gastroenterology Patient Name: Curtis Rosales Procedure Date: 06/12/2024 10:09 AM MRN: [id number] Account #: [id number] Date of Birth: 1961/04/11 Admit Type: Outpatient Age: 63 Room: [facility] ENDO ROOM 2 Gender: Male Note Status: Finalized Instrument Name: Colon Scope [id number] Procedure:             Colonoscopy Indications:           Generalized abdominal pain, Change in bowel habits Providers:             Elspeth Ozell Onita ROSALEA, DO Referring MD:          Cheryl CHARLENA Jericho (Referring MD) Medicines:             Monitored Anesthesia Care Complications:         No immediate complications. Estimated blood loss:                         Minimal. Procedure:             Pre-Anesthesia Assessment:                        - Prior to the procedure, a History and Physical was                         performed, and patient medications and allergies were                         reviewed. The patient is competent. The risks and                         benefits of the procedure and the sedation options and                         risks were discussed with the patient. All questions                         were answered and informed consent was obtained.                         Patient identification and proposed procedure were                         verified by the physician, the nurse, the anesthetist                         and the technician in the endoscopy suite. Mental                         Status Examination: alert and oriented. Airway                         Examination: normal oropharyngeal airway and neck                         mobility. Respiratory Examination: clear to                         auscultation. CV Examination: RRR, no murmurs, no S3  or S4. Prophylactic Antibiotics: The patient does not                         require prophylactic antibiotics. Prior                         Anticoagulants: The patient has taken no anticoagulant                          or antiplatelet agents. ASA Grade Assessment: III - A                         patient with severe systemic disease. After reviewing                         the risks and benefits, the patient was deemed in                         satisfactory condition to undergo the procedure. The                         anesthesia plan was to use monitored anesthesia care                         (MAC). Immediately prior to administration of                         medications, the patient was re-assessed for adequacy                         to receive sedatives. The heart rate, respiratory                         rate, oxygen saturations, blood pressure, adequacy of                         pulmonary ventilation, and response to care were                         monitored throughout the procedure. The physical                         status of the patient was re-assessed after the                         procedure.                        After obtaining informed consent, the colonoscope was                         passed under direct vision. Throughout the procedure,                         the patient's blood pressure, pulse, and oxygen                         saturations were monitored continuously. The  Colonoscope was introduced through the anus and                         advanced to the the terminal ileum, with                         identification of the appendiceal orifice and IC                         valve. The colonoscopy was performed without                         difficulty. The patient tolerated the procedure well.                         The quality of the bowel preparation was evaluated                         using the BBPS [facility] Bowel Preparation Scale) with                         scores of: Right Colon = 3, Transverse Colon = 3 and                         Left Colon = 3 (entire mucosa seen well with no                          residual staining, small fragments of stool or opaque                         liquid). The total BBPS score equals 9. The terminal                         ileum, ileocecal valve, appendiceal orifice, and                         rectum were photographed. Findings:      The perianal and digital rectal examinations were normal. Pertinent       negatives include normal sphincter tone.      The terminal ileum appeared normal. Estimated blood loss: none.      Retroflexion in the right colon was performed.      Multiple small-mouthed diverticula were found in the left colon.       Estimated blood loss: none.      A 3 to 4 mm polyp was found in the cecum. The polyp was sessile. The       polyp was removed with a cold snare. Resection and retrieval were       complete. Estimated blood loss was minimal.      The exam was otherwise without abnormality on direct and retroflexion       views. Impression:            - The examined portion of the ileum was normal.                        - Diverticulosis in the left colon.                        -  One 3 to 4 mm polyp in the cecum, removed with a                         cold snare. Resected and retrieved.                        - The examination was otherwise normal on direct and                         retroflexion views. Recommendation:        - Patient has a contact number available for                         emergencies. The signs and symptoms of potential                         delayed complications were discussed with the patient.                         Return to normal activities tomorrow. Written                         discharge instructions were provided to the patient.                        - Discharge patient to home.                        - Resume previous diet.                        - Continue present medications.                        - Await pathology results.                        - Repeat colonoscopy for surveillance based on                          pathology results.                        - Return to referring physician as previously                         scheduled.                        - The findings and recommendations were discussed with                         the patient. Procedure Code(s):     --- Professional ---                        [id number], Colonoscopy, flexible; with removal of                         tumor(s), polyp(s), or other lesion(s) by snare  technique Diagnosis Code(s):     --- Professional ---                        D12.0, Benign neoplasm of cecum                        R10.84, Generalized abdominal pain                        R19.4, Change in bowel habit                        K57.30, Diverticulosis of large intestine without                         perforation or abscess without bleeding CPT copyright 2022 American [employer] Association. All rights reserved. The codes documented in this report are preliminary and upon coder review may  be revised to meet current compliance requirements. Attending Participation:      I personally performed the entire procedure. Elspeth Jungling, DO Elspeth Ozell Jungling DO, DO 06/12/2024 11:04:58 AM This report has been signed electronically. Number of Addenda: 0 Note Initiated On: 06/12/2024 10:09 AM Scope Withdrawal Time: 0 hours 13 minutes 0 seconds  Total Procedure Duration: 0 hours 17 minutes 53 seconds  Estimated Blood Loss:  Estimated blood loss was minimal.      [facility]

## 2024-06-12 NOTE — Op Note (Signed)
 [facility] Gastroenterology Patient Name: Curtis Rosales Procedure Date: 06/12/2024 10:10 AM MRN: [id number] Account #: [id number] Date of Birth: 05-Jul-1961 Admit Type: Outpatient Age: 63 Room: [facility] ENDO ROOM 2 Gender: Male Note Status: Finalized Instrument Name: Upper GI Scope [id number] Procedure:             Upper GI endoscopy Indications:           Dysphagia Providers:             Elspeth Ozell Jungling DO, DO Referring MD:          Cheryl CHARLENA Jericho (Referring MD) Medicines:             Monitored Anesthesia Care Complications:         No immediate complications. Estimated blood loss:                         Minimal. Procedure:             Pre-Anesthesia Assessment:                        - Prior to the procedure, a History and Physical was                         performed, and patient medications and allergies were                         reviewed. The patient is competent. The risks and                         benefits of the procedure and the sedation options and                         risks were discussed with the patient. All questions                         were answered and informed consent was obtained.                         Patient identification and proposed procedure were                         verified by the physician, the nurse, the anesthetist                         and the technician in the endoscopy suite. Mental                         Status Examination: alert and oriented. Airway                         Examination: normal oropharyngeal airway and neck                         mobility. Respiratory Examination: clear to                         auscultation. CV Examination: RRR, no murmurs, no S3  or S4. Prophylactic Antibiotics: The patient does not                         require prophylactic antibiotics. Prior                         Anticoagulants: The patient has taken no anticoagulant                         or  antiplatelet agents. ASA Grade Assessment: III - A                         patient with severe systemic disease. After reviewing                         the risks and benefits, the patient was deemed in                         satisfactory condition to undergo the procedure. The                         anesthesia plan was to use monitored anesthesia care                         (MAC). Immediately prior to administration of                         medications, the patient was re-assessed for adequacy                         to receive sedatives. The heart rate, respiratory                         rate, oxygen saturations, blood pressure, adequacy of                         pulmonary ventilation, and response to care were                         monitored throughout the procedure. The physical                         status of the patient was re-assessed after the                         procedure.                        After obtaining informed consent, the endoscope was                         passed under direct vision. Throughout the procedure,                         the patient's blood pressure, pulse, and oxygen                         saturations were monitored continuously. The Endoscope  was introduced through the mouth, and advanced to the                         second part of duodenum. The upper GI endoscopy was                         accomplished without difficulty. The patient tolerated                         the procedure well. Findings:      The duodenal bulb, first portion of the duodenum and second portion of       the duodenum were normal. Estimated blood loss: none.      A small hiatal hernia was present. Estimated blood loss: none.      The exam of the stomach was otherwise normal.      Esophagogastric landmarks were identified: the gastroesophageal junction       was found at 40 cm from the incisors.      The Z-line was irregular. Mucosa was  biopsied with a cold forceps for       histology. One specimen bottle was sent to pathology. Estimated blood       loss was minimal.      The exam of the esophagus was otherwise normal.      No endoscopic abnormality was evident in the esophagus to explain the       patient's complaint of dysphagia. Estimated blood loss: none. Impression:            - Normal duodenal bulb, first portion of the duodenum                         and second portion of the duodenum.                        - Small hiatal hernia.                        - Esophagogastric landmarks identified.                        - Z-line irregular. Biopsied.                        - No endoscopic esophageal abnormality to explain                         patient's dysphagia. Recommendation:        - Patient has a contact number available for                         emergencies. The signs and symptoms of potential                         delayed complications were discussed with the patient.                         Return to normal activities tomorrow. Written                         discharge instructions were provided to the patient.                        -  Discharge patient to home.                        - Resume previous diet.                        - Continue present medications.                        - Can consider increased proton pump inhibitor.                        Patient describes issues transferring from mouth to                         esophagus. Can consider modified barium swallow study.                        - Await pathology results.                        - Repeat upper endoscopy for surveillance based on                         pathology results.                        - Return to GI office as previously scheduled.                        - The findings and recommendations were discussed with                         the patient. Procedure Code(s):     --- Professional ---                        [phone],  Esophagogastroduodenoscopy, flexible,                         transoral; with biopsy, single or multiple Diagnosis Code(s):     --- Professional ---                        K44.9, Diaphragmatic hernia without obstruction or                         gangrene                        K22.89, Other specified disease of esophagus                        R13.10, Dysphagia, unspecified CPT copyright 2022 American Medical Association. All rights reserved. The codes documented in this report are preliminary and upon coder review may  be revised to meet current compliance requirements. Attending Participation:      I personally performed the entire procedure. Elspeth Jungling, DO Elspeth Ozell Jungling DO, DO 06/12/2024 10:36:24 AM This report has been signed electronically. Number of Addenda: 0 Note Initiated On: 06/12/2024 10:10 AM Estimated Blood Loss:  Estimated blood loss was minimal.      [facility]

## 2024-06-12 NOTE — Interval H&P Note (Signed)
 History and Physical Interval Note: Preprocedure H&P from 06/12/24  was reviewed and there was no interval change after seeing and examining the patient.  Written consent was obtained from the patient after discussion of risks, benefits, and alternatives. Patient has consented to proceed with Esophagogastroduodenoscopy and Colonoscopy with possible intervention   06/12/2024 10:13 AM  Curtis Rosales  has presented today for surgery, with the diagnosis of Bowel habit changes, pharyngoesophageal dysphagia,GERD.general abdominal pain.  The various methods of treatment have been discussed with the patient and family. After consideration of risks, benefits and other options for treatment, the patient has consented to  Procedure(s): COLONOSCOPY (N/A) EGD (ESOPHAGOGASTRODUODENOSCOPY) (N/A) as a surgical intervention.  The patient's history has been reviewed, patient examined, no change in status, stable for surgery.  I have reviewed the patient's chart and labs.  Questions were answered to the patient's satisfaction.     Elspeth Ozell Jungling

## 2024-06-12 NOTE — Anesthesia Postprocedure Evaluation (Signed)
 Anesthesia Post Note  Patient: Curtis Rosales  Procedure(s) Performed: COLONOSCOPY EGD (ESOPHAGOGASTRODUODENOSCOPY) POLYPECTOMY, INTESTINE  Anesthesia Type: General Anesthetic complications: no   There were no known notable events for this encounter.   Last Vitals:  Vitals:   06/12/24 1111 06/12/24 1121  BP: (!) 92/56 121/78  Pulse: 77 73  Resp: 17 20  Temp:    SpO2: 97% 100%    Last Pain:  Vitals:   06/12/24 1121  TempSrc:   PainSc: 0-No pain                 Lynwood KANDICE Clause

## 2024-06-12 NOTE — Transfer of Care (Signed)
 Immediate Anesthesia Transfer of Care Note  Patient: Curtis Rosales  Procedure(s) Performed: COLONOSCOPY EGD (ESOPHAGOGASTRODUODENOSCOPY) POLYPECTOMY, INTESTINE  Patient Location: Endoscopy Unit  Anesthesia Type:General  Level of Consciousness: drowsy  Airway & Oxygen Therapy: Patient Spontanous Breathing  Post-op Assessment: Report given to RN and Post -op Vital signs reviewed and stable  Post vital signs: Reviewed and stable  Last Vitals:  Vitals Value Taken Time  BP 98/62 06/12/24 11:02  Temp 35.9 C 06/12/24 11:01  Pulse 81 06/12/24 11:02  Resp 13 06/12/24 11:02  SpO2 95 % 06/12/24 11:02  Vitals shown include unfiled device data.  Last Pain:  Vitals:   06/12/24 1101  TempSrc: Tympanic  PainSc: Asleep         Complications: There were no known notable events for this encounter.

## 2024-06-12 NOTE — Anesthesia Preprocedure Evaluation (Signed)
 Anesthesia Evaluation  Patient identified by MRN, date of birth, ID band Patient awake    Reviewed: Allergy & Precautions, H&P , NPO status , Patient's Chart, lab work & pertinent test results, reviewed documented beta blocker date and time   Airway Mallampati: II   Neck ROM: full    Dental  (+) Poor Dentition   Pulmonary pneumonia, resolved   Pulmonary exam normal        Cardiovascular Exercise Tolerance: Poor hypertension, On Medications + angina with exertion + CAD and + Past MI  Normal cardiovascular exam Rhythm:regular Rate:Normal     Neuro/Psych negative neurological ROS  negative psych ROS   GI/Hepatic negative GI ROS, Neg liver ROS,,,  Endo/Other  negative endocrine ROSdiabetes, Well Controlled    Renal/GU negative Renal ROS  negative genitourinary   Musculoskeletal   Abdominal   Peds  Hematology negative hematology ROS (+)   Anesthesia Other Findings Past Medical History: No date: Ascending aorta dilatation (HCC)     Comment:  Echo 4/22: 39 mm // Chest CT 2/22: nonaneurysmal aorta No date: CAD (coronary artery disease)     Comment:  a. 07/2012 Anterolateral MI/Cath: LAD 100 100%-> 3.0x16               Promus Element DES, otw nonobs dzs, EF 30% No date: Cardiac arrest (HCC) No date: DM2 (diabetes mellitus, type 2) (HCC) No date: History of COVID-19     Comment:  prolonged hospitalization c/b pneumonia,               pneumomediastinum No date: HLD (hyperlipidemia) No date: HTN (hypertension) No date: Ischemic cardiomyopathy     Comment:  a. 07/2012 Echo: EF 25-30% w/ mid ant, basal inf, sept               Sev HK & apical, dist post, mid/dist inf, dist ant, dist               lat AK. Gr 1 DD, mild MR. // Echo 3/14: EF 50-55 // Echo               4/22: EF 50-55, anteroseptal and inferoseptal HK,               moderate asymmetric LVH, GR 1 DD, normal RVSF, trivial               MR, mild dilation of  ascending aorta (39 mm)  No date: MI (myocardial infarction) (HCC) No date: VF (ventricular fibrillation) (HCC)     Comment:  a. in setting of MI 07/2012 Past Surgical History: No date: APPENDECTOMY No date: CARDIAC CATHETERIZATION 12/04/2021: LEFT HEART CATH AND CORONARY ANGIOGRAPHY; N/A     Comment:  Procedure: LEFT HEART CATH AND CORONARY ANGIOGRAPHY;                Surgeon: Wonda Sharper, MD;  Location: [facility] INVASIVE CV               LAB;  Service: Cardiovascular;  Laterality: N/A; 07/08/2012: LEFT HEART CATHETERIZATION WITH CORONARY ANGIOGRAM; N/A     Comment:  Procedure: LEFT HEART CATHETERIZATION WITH CORONARY               ANGIOGRAM;  Surgeon: Sharper Wonda, MD;  Location: [facility]               CATH LAB;  Service: Cardiovascular;  Laterality: N/A; 07/08/2012: PERCUTANEOUS CORONARY STENT INTERVENTION (PCI-S)     Comment:  Procedure: PERCUTANEOUS CORONARY STENT INTERVENTION               (  PCI-S);  Surgeon: Ozell Fell, MD;  Location: [facility] CATH              LAB;  Service: Cardiovascular;;   Reproductive/Obstetrics negative OB ROS                              Anesthesia Physical Anesthesia Plan  ASA: 3  Anesthesia Plan: General   Post-op Pain Management:    Induction:   PONV Risk Score and Plan:   Airway Management Planned:   Additional Equipment:   Intra-op Plan:   Post-operative Plan:   Informed Consent: I have reviewed the patients History and Physical, chart, labs and discussed the procedure including the risks, benefits and alternatives for the proposed anesthesia with the patient or authorized representative who has indicated his/her understanding and acceptance.     Dental Advisory Given  Plan Discussed with: CRNA  Anesthesia Plan Comments:         Anesthesia Quick Evaluation

## 2024-06-12 NOTE — Addendum Note (Signed)
 Addendum  created 06/12/24 1401 by Lael Wetherbee R, CRNA   Flowsheet accepted

## 2024-06-12 NOTE — H&P (Signed)
 Pre-Procedure H&P   Patient ID: Curtis Rosales is a 63 y.o. male.  Gastroenterology Provider: Elspeth Ozell Jungling, DO  Referring Provider: Delmar Gails, NP PCP: Jeffie Cheryl BRAVO, MD  Date: 06/12/2024  HPI Mr. Curtis Rosales is a 63 y.o. male who presents today for Esophagogastroduodenoscopy and Colonoscopy for GERD, dysphagia, generalized abdominal pain, change in bowel movements .  6 months of change in bowel movements.  He does have abdominal discomfort that improves with bowel movement.  No melena or hematochezia no constipation or diarrhea  Reflux well-controlled on omeprazole  (otc).  He has noted some solid food dysphagia.  No issues with liquids or pills.  No odynophagia.   Past Medical History:  Diagnosis Date   Ascending aorta dilatation (HCC)    Echo 4/22: 39 mm // Chest CT 2/22: nonaneurysmal aorta   CAD (coronary artery disease)    a. 07/2012 Anterolateral MI/Cath: LAD 100 100%-> 3.0x16 Promus Element DES, otw nonobs dzs, EF 30%   Cardiac arrest (HCC)    DM2 (diabetes mellitus, type 2) (HCC)    History of COVID-19    prolonged hospitalization c/b pneumonia, pneumomediastinum   HLD (hyperlipidemia)    HTN (hypertension)    Ischemic cardiomyopathy    a. 07/2012 Echo: EF 25-30% w/ mid ant, basal inf, sept Sev HK & apical, dist post, mid/dist inf, dist ant, dist lat AK. Gr 1 DD, mild MR. // Echo 3/14: EF 50-55 // Echo 4/22: EF 50-55, anteroseptal and inferoseptal HK, moderate asymmetric LVH, GR 1 DD, normal RVSF, trivial MR, mild dilation of ascending aorta (39 mm)    MI (myocardial infarction) (HCC)    VF (ventricular fibrillation) (HCC)    a. in setting of MI 07/2012    Past Surgical History:  Procedure Laterality Date   APPENDECTOMY     CARDIAC CATHETERIZATION     LEFT HEART CATH AND CORONARY ANGIOGRAPHY N/A 12/04/2021   Procedure: LEFT HEART CATH AND CORONARY ANGIOGRAPHY;  Surgeon: Wonda Ozell, MD;  Location: [facility] INVASIVE CV LAB;  Service: Cardiovascular;   Laterality: N/A;   LEFT HEART CATHETERIZATION WITH CORONARY ANGIOGRAM N/A 07/08/2012   Procedure: LEFT HEART CATHETERIZATION WITH CORONARY ANGIOGRAM;  Surgeon: Ozell Wonda, MD;  Location: [facility] CATH LAB;  Service: Cardiovascular;  Laterality: N/A;   PERCUTANEOUS CORONARY STENT INTERVENTION (PCI-S)  07/08/2012   Procedure: PERCUTANEOUS CORONARY STENT INTERVENTION (PCI-S);  Surgeon: Ozell Wonda, MD;  Location: [facility] CATH LAB;  Service: Cardiovascular;;    Family History No h/o GI disease or malignancy  Review of Systems  Constitutional:  Negative for activity change, appetite change, chills, diaphoresis, fatigue, fever and unexpected weight change.  HENT:  Positive for trouble swallowing. Negative for voice change.   Respiratory:  Negative for shortness of breath and wheezing.   Cardiovascular:  Negative for chest pain, palpitations and leg swelling.  Gastrointestinal:  Positive for abdominal pain. Negative for abdominal distention, anal bleeding, blood in stool, constipation, diarrhea, nausea and vomiting.  Musculoskeletal:  Negative for arthralgias and myalgias.  Skin:  Negative for color change and pallor.  Neurological:  Negative for dizziness, syncope and weakness.  Psychiatric/Behavioral:  Negative for confusion. The patient is not nervous/anxious.   All other systems reviewed and are negative.    Medications No current facility-administered medications on file prior to encounter.   Current Outpatient Medications on File Prior to Encounter  Medication Sig Dispense Refill   acetaminophen  (TYLENOL ) 500 MG tablet Take 1,000 mg by mouth every 6 (six) hours as needed for mild pain.  albuterol  (VENTOLIN  HFA) 108 (90 Base) MCG/ACT inhaler Inhale 2 puffs into the lungs every 4 (four) hours as needed for shortness of breath or wheezing. 6.7 g 0   aspirin  EC 81 MG tablet Take 1 tablet (81 mg total) by mouth daily.     Multiple Vitamin (MULTIVITAMIN WITH MINERALS) TABS tablet Take 1 tablet by  mouth daily.     omeprazole  (PRILOSEC  OTC) 20 MG tablet Take 20 mg by mouth daily.     nitroGLYCERIN  (NITROSTAT ) 0.4 MG SL tablet DISSOLVE ONE TABLET UNDER THE TONGUE EVERY 5 MINUTES AS NEEDED FOR CHEST PAIN.  DO NOT EXCEED A TOTAL OF 3 DOSES IN 15 MINUTES 25 tablet 5   [DISCONTINUED] fenofibrate  160 MG tablet Take 1 tablet (160 mg total) by mouth daily. 90 tablet 3    Pertinent medications related to GI and procedure were reviewed by me with the patient prior to the procedure   Current Facility-Administered Medications:    0.9 %  sodium chloride  infusion, , Intravenous, Continuous, Onita Elspeth Sharper, DO  sodium chloride          Allergies  Allergen Reactions   Ibuprofen Other (See Comments)    Heart issue   Allergies were reviewed by me prior to the procedure  Objective   Body mass index is 27.73 kg/m. Vitals:   06/12/24 0926  BP: 122/82  Pulse: 74  Resp: 20  Temp: (!) 96.5 F (35.8 C)  TempSrc: Temporal  SpO2: 98%  Weight: 90.2 kg  Height: 5' 11 (1.803 m)     Physical Exam Vitals and nursing note reviewed.  Constitutional:      General: He is not in acute distress.    Appearance: Normal appearance. He is not ill-appearing, toxic-appearing or diaphoretic.  HENT:     Head: Normocephalic and atraumatic.     Nose: Nose normal.     Mouth/Throat:     Mouth: Mucous membranes are moist.     Pharynx: Oropharynx is clear.  Eyes:     General: No scleral icterus.    Extraocular Movements: Extraocular movements intact.  Cardiovascular:     Rate and Rhythm: Normal rate and regular rhythm.     Heart sounds: Normal heart sounds. No murmur heard.    No friction rub. No gallop.  Pulmonary:     Effort: Pulmonary effort is normal. No respiratory distress.     Breath sounds: Normal breath sounds. No wheezing, rhonchi or rales.  Abdominal:     General: Bowel sounds are normal. There is no distension.     Palpations: Abdomen is soft.     Tenderness: There is no  abdominal tenderness. There is no guarding or rebound.  Musculoskeletal:     Cervical back: Neck supple.     Right lower leg: No edema.     Left lower leg: No edema.  Skin:    General: Skin is warm and dry.     Coloration: Skin is not jaundiced or pale.  Neurological:     General: No focal deficit present.     Mental Status: He is alert and oriented to person, place, and time. Mental status is at baseline.  Psychiatric:        Mood and Affect: Mood normal.        Behavior: Behavior normal.        Thought Content: Thought content normal.        Judgment: Judgment normal.      Assessment:  Mr. Curtis Rosales is a  63 y.o. male  who presents today for Esophagogastroduodenoscopy and Colonoscopy for GERD, dysphagia, generalized abdominal pain, change in bowel movements .  Plan:  Esophagogastroduodenoscopy and Colonoscopy with possible intervention today  Esophagogastroduodenoscopy and Colonoscopy with possible biopsy, control of bleeding, polypectomy, and interventions as necessary has been discussed with the patient/patient representative. Informed consent was obtained from the patient/patient representative after explaining the indication, nature, and risks of the procedure including but not limited to death, bleeding, perforation, missed neoplasm/lesions, cardiorespiratory compromise, and reaction to medications. Opportunity for questions was given and appropriate answers were provided. Patient/patient representative has verbalized understanding is amenable to undergoing the procedure.   Elspeth Ozell Jungling, DO  [facility]  Portions of the record may have been created with voice recognition software. Occasional wrong-word or 'sound-a-like' substitutions may have occurred due to the inherent limitations of voice recognition software.  Read the chart carefully and recognize, using context, where substitutions may have occurred.

## 2024-06-13 ENCOUNTER — Encounter: Payer: Self-pay | Admitting: Gastroenterology

## 2024-06-13 LAB — SURGICAL PATHOLOGY

## 2024-10-02 ENCOUNTER — Encounter: Payer: Self-pay | Admitting: Cardiovascular Disease

## 2024-10-02 ENCOUNTER — Ambulatory Visit: Payer: Self-pay | Attending: Cardiovascular Disease | Admitting: Cardiovascular Disease

## 2024-10-02 VITALS — BP 110/70 | HR 70 | Ht 71.0 in | Wt 209.0 lb

## 2024-10-02 DIAGNOSIS — I2510 Atherosclerotic heart disease of native coronary artery without angina pectoris: Secondary | ICD-10-CM

## 2024-10-02 DIAGNOSIS — I10 Essential (primary) hypertension: Secondary | ICD-10-CM

## 2024-10-02 DIAGNOSIS — E7849 Other hyperlipidemia: Secondary | ICD-10-CM

## 2024-10-02 MED ORDER — NITROGLYCERIN 0.4 MG SL SUBL: SUBLINGUAL_TABLET | SUBLINGUAL | 3 refills | Status: AC

## 2024-10-02 NOTE — Progress Notes (Signed)
 Cardiology Office Note:    Date:  10/02/2024   ID:  Curtis Rosales, DOB 04-Feb-1961, MRN [id number]  PCP:  Curtis Cheryl BRAVO, MD   [facility] Providers Cardiologist:  Curtis Fell, MD     Referring MD: Curtis Cheryl BRAVO, MD   Chief Complaint  Patient presents with   Coronary Artery Disease    History of Present Illness:    Curtis Rosales is a 63 y.o. male with a hx of:  Coronary artery disease  [place] STEMI in 2013 c/b OOH VF arrest>>S/p DES to LAD, POBA to Dx Cath 2024: nonobstructive disease, patent LAD stent, jailed diagonal unchanged from baseline procedure-->med Rx Ischemic CM Echocardiogram 9/13: EF 25-30 Echocardiogram 3/14: EF 50-55 Hypertension  Hyperlipidemia  Aortic atherosclerosis (CT 12/2020) Diabetes mellitus (dx 11/2020) Hx of COVD-19 admx in 11/2020 w hypoxic resp failure 2/2 COVID-19 pneumonia  The patient is here with his wife today. He reports that he is doing well. Today, he denies symptoms of palpitations, chest pain, shortness of breath, orthopnea, PND, lower extremity edema, dizziness, or syncope. He feels like he would get short of breath if he exercised at a high level but is asymptomatic at his current activity level. 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   Current Medications: Current Meds  Medication Sig   acetaminophen  (TYLENOL ) 500 MG tablet Take 1,000 mg by mouth every 6 (six) hours as needed for mild pain.   albuterol  (VENTOLIN  HFA) 108 (90 Base) MCG/ACT inhaler Inhale 2 puffs into the lungs every 4 (four) hours as needed for shortness of breath or wheezing.   aspirin  EC 81 MG tablet Take 1 tablet (81 mg total) by mouth daily.   carvedilol  (COREG ) 3.125 MG tablet Take 1 tablet (3.125 mg total) by mouth 2 (two) times daily with a meal.   icosapent  Ethyl (VASCEPA ) 1 g capsule Take 2 capsules (2 g total) by mouth 2 (two) times daily.   Multiple Vitamin (MULTIVITAMIN WITH MINERALS) TABS tablet Take 1 tablet by mouth  daily.   omeprazole  (PRILOSEC  OTC) 20 MG tablet Take 20 mg by mouth daily.   rosuvastatin  (CRESTOR ) 20 MG tablet Take 1 tablet (20 mg total) by mouth daily. Please keep upcoming appointment for any future refills Thank you   [DISCONTINUED] nitroGLYCERIN  (NITROSTAT ) 0.4 MG SL tablet DISSOLVE ONE TABLET UNDER THE TONGUE EVERY 5 MINUTES AS NEEDED FOR CHEST PAIN.  DO NOT EXCEED A TOTAL OF 3 DOSES IN 15 MINUTES     Allergies:   Ibuprofen   ROS:   Please see the history of present illness.    All other systems reviewed and are negative.  EKGs/Labs/Other Studies Reviewed:    The following studies were reviewed today: Cardiac Studies & Procedures   ______________________________________________________________________________________________ CARDIAC CATHETERIZATION  CARDIAC CATHETERIZATION 12/04/2021  Conclusion 1.  Right dominant coronary artery with mild mid RCA plaquing, mild distal RCA stenosis, and mild proximal PDA stenosis.  No obstructive disease throughout the RCA distribution. 2.  Widely patent left main, left circumflex, and ramus intermedius. 3.  Continued patency of the proximal LAD stented segment with mild diffuse 30% in-stent restenosis and mild diffuse nonobstructive plaquing throughout the LAD 4.  Moderate to severe ostial diagonal stenosis, vessel jailed by proximal LAD stent, unchanged from index procedure in 2013.  Recommendations: Stable coronary anatomy.  Continue medical therapy.  The patient stress Myoview  which showed inferior ischemia is likely related to artifact as there is no significant coronary stenosis in this  distribution.  The ostial diagonal lesion is unchanged and in fact somewhat improved from the patient's index procedure 10 years ago.  Findings Coronary Findings Diagnostic  Dominance: Right  Left Main The vessel exhibits minimal luminal irregularities.  Left Anterior Descending There is mild diffuse disease throughout the vessel. Prox LAD to Mid LAD  lesion is 30% stenosed. The lesion was previously treated using a drug eluting stent over 2 years ago.  First Diagonal Branch There is mild disease in the vessel. 1st Diag lesion is 60% stenosed. Ostial lesion jailed by LAD stent  Ramus Intermedius The vessel exhibits minimal luminal irregularities.  Left Circumflex The vessel exhibits minimal luminal irregularities.  Right Coronary Artery Mid RCA lesion is 35% stenosed. There is mild diffuse plaquing in the mid RCA.  Otherwise the vessel is patent until the distal aspect where there is another mild nonobstructive appearing lesion. Dist RCA lesion is 30% stenosed.  Right Posterior Descending Artery RPDA lesion is 30% stenosed.  Intervention  No interventions have been documented.   STRESS TESTS  MYOCARDIAL PERFUSION IMAGING 11/20/2021  Interpretation Summary   The study is intermediate risk.   1-2 mm ST depression inferior leads that resolved in recovery   Left ventricular function is normal. Nuclear stress EF: 59 %. The left ventricular ejection fraction is normal (55-65%). End diastolic cavity size is normal.   Prior study available for comparison from 01/28/2017.  Moderate in size and intensity defect in the basal septal/inferoseptal wall that's mildy worse with stress consistent with possible ischemia along with infero/inferoseptal wall hypokinesis. Moderate inferior defect; mildly worse at rest consistent with scar v. artifact.   ECHOCARDIOGRAM  ECHOCARDIOGRAM COMPLETE 02/24/2021  Narrative ECHOCARDIOGRAM REPORT    Patient Name:   Curtis Rosales      Date of Exam: 02/24/2021 Medical Rec #:  [id number]     Height:       71.0 in Accession #:    [id number]    Weight:       195.0 lb Date of Birth:  09-Apr-1961     BSA:          2.086 m Patient Age:    60 years      BP:           100/62 mmHg Patient Gender: M             HR:           76 bpm. Exam Location:  [address]  Procedure: 2D Echo, Cardiac Doppler and Color  Doppler  Indications:    I25.10 Coronary artery disease.  History:        Patient has prior history of Echocardiogram examinations, most recent 01/03/2013. Risk Factors:Hypertension, Diabetes and Dyslipidemia. Myocardial infarction. Coronary artery. Cardiac arrest. Ventricular fibrillation. COVID-19. Ischemic cardiomyopathy.  Sonographer:    Carl Coma RDCS Referring Phys: [id number] GLENDIA DASEN WEAVER  IMPRESSIONS   1. Left ventricular ejection fraction, by estimation, is 50 to 55%. The left ventricle has normal function. The left ventricle demonstrates regional wall motion abnormalities (see scoring diagram/findings for description). Focal akinesis of mid anteroseptal/inferoseptal walls with overall normal systolic function. There is moderate asymmetric left ventricular hypertrophy of the basal-septal segment. Left ventricular diastolic parameters are consistent with Grade I diastolic dysfunction (impaired relaxation). 2. Right ventricular systolic function is normal. The right ventricular size is normal. Tricuspid regurgitation signal is inadequate for assessing PA pressure. 3. The mitral valve is normal in structure. Trivial mitral valve regurgitation. 4. The aortic valve is  tricuspid. Aortic valve regurgitation is not visualized. No aortic stenosis is present. 5. Aortic dilatation noted. There is mild dilatation of the ascending aorta, measuring 39 mm.  FINDINGS Left Ventricle: Left ventricular ejection fraction, by estimation, is 50 to 55%. The left ventricle has low normal function. The left ventricle demonstrates regional wall motion abnormalities. The left ventricular internal cavity size was normal in size. There is moderate asymmetric left ventricular hypertrophy of the basal-septal segment. Left ventricular diastolic parameters are consistent with Grade I diastolic dysfunction (impaired relaxation).   LV Wall Scoring: The mid anteroseptal segment and mid inferoseptal segment  are akinetic. The entire anterior wall, entire lateral wall, entire apex, entire inferior wall, basal anteroseptal segment, and basal inferoseptal segment are normal.  Right Ventricle: The right ventricular size is normal. No increase in right ventricular wall thickness. Right ventricular systolic function is normal. Tricuspid regurgitation signal is inadequate for assessing PA pressure.  Left Atrium: Left atrial size was normal in size.  Right Atrium: Right atrial size was normal in size.  Pericardium: There is no evidence of pericardial effusion.  Mitral Valve: The mitral valve is normal in structure. Trivial mitral valve regurgitation.  Tricuspid Valve: The tricuspid valve is normal in structure. Tricuspid valve regurgitation is trivial.  Aortic Valve: The aortic valve is tricuspid. Aortic valve regurgitation is not visualized. No aortic stenosis is present.  Pulmonic Valve: The pulmonic valve was grossly normal. Pulmonic valve regurgitation is not visualized. No evidence of pulmonic stenosis.  Aorta: Aortic dilatation noted. There is mild dilatation of the ascending aorta, measuring 39 mm.  IAS/Shunts: No atrial level shunt detected by color flow Doppler.   LEFT VENTRICLE PLAX 2D LVIDd:         4.70 cm  Diastology LVIDs:         3.30 cm  LV e' medial:    5.33 cm/s LV PW:         1.10 cm  LV E/e' medial:  9.5 LV IVS:        1.10 cm  LV e' lateral:   7.51 cm/s LVOT diam:     2.40 cm  LV E/e' lateral: 6.7 LV SV:         79 LV SV Index:   38 LVOT Area:     4.52 cm   RIGHT VENTRICLE RV Basal diam:  3.80 cm RV S prime:     11.20 cm/s TAPSE (M-mode): 2.3 cm  LEFT ATRIUM             Index       RIGHT ATRIUM           Index LA diam:        4.60 cm 2.21 cm/m  RA Area:     12.90 cm LA Vol (A2C):   59.8 ml 28.67 ml/m RA Volume:   34.60 ml  16.59 ml/m LA Vol (A4C):   53.1 ml 25.46 ml/m LA Biplane Vol: 57.1 ml 27.37 ml/m AORTIC VALVE LVOT Vmax:   78.30 cm/s LVOT Vmean:   56.700 cm/s LVOT VTI:    0.175 m  AORTA Ao Root diam: 3.50 cm Ao Asc diam:  3.90 cm  MITRAL VALVE MV Area (PHT): 3.37 cm    SHUNTS MV Decel Time: 225 msec    Systemic VTI:  0.18 m MV E velocity: 50.60 cm/s  Systemic Diam: 2.40 cm MV A velocity: 61.30 cm/s MV E/A ratio:  0.83  Lonni Nanas MD Electronically signed by Lonni Nanas MD Signature  Date/Time: 02/24/2021/6:35:35 PM    Final    MONITORS  CARDIAC EVENT MONITOR 02/24/2021  Narrative 1. The basic rhythm is normal sinus with an average HR of 84 bpm 2. No atrial fibrillation or flutter 3. No high-grade heart block or pathologic pauses 4. There are occasional PVC's and occasional supraventricular beats without sustained arrhythmias  Overall there are no significant arrhythmias identified       ______________________________________________________________________________________________      EKG:   EKG Interpretation Date/Time:  Monday October 02 2024 08:29:29 EST Ventricular Rate:  70 PR Interval:  166 QRS Duration:  98 QT Interval:  406 QTC Calculation: 438 R Axis:   46  Text Interpretation: Normal sinus rhythm Normal ECG When compared with ECG of 04-Dec-2021 06:31, No significant change was found Confirmed by Wonda Sharper [phone]) on 10/02/2024 8:41:07 AM    Recent Labs: No results found for requested labs within last 365 days.  Recent Lipid Panel    Component Value Date/Time   CHOL 127 07/21/2021 1048   TRIG 318 (H) 07/21/2021 1048   HDL 29 (L) 07/21/2021 1048   CHOLHDL 4.4 07/21/2021 1048   CHOLHDL 4.9 11/15/2015 0933   VLDL 44 (H) 11/15/2015 0933   LDLCALC 49 07/21/2021 1048   LDLDIRECT 81.1 09/21/2014 0834     Risk Assessment/Calculations:                Physical Exam:    VS:  BP 110/70 (BP Location: Left Arm, Patient Position: Sitting, Cuff Size: Normal)   Pulse 70   Ht 5' 11 (1.803 m)   Wt 209 lb (94.8 kg)   SpO2 96%   BMI 29.15 kg/m     Wt Readings  from Last 3 Encounters:  10/02/24 209 lb (94.8 kg)  06/12/24 198 lb 12.8 oz (90.2 kg)  02/23/23 211 lb 3.2 oz (95.8 kg)     GEN:  Well nourished, well developed in no acute distress HEENT: Normal NECK: No JVD; No carotid bruits LYMPHATICS: No lymphadenopathy CARDIAC: RRR, no murmurs, rubs, gallops RESPIRATORY:  Clear to auscultation without rales, wheezing or rhonchi  ABDOMEN: Soft, non-tender, non-distended MUSCULOSKELETAL:  No edema; No deformity  SKIN: Warm and dry NEUROLOGIC:  Alert and oriented x 3 PSYCHIATRIC:  Normal affect   Assessment & Plan Coronary artery disease involving native coronary artery of native heart without angina pectoris The patient is clinically stable with no angina.  He is on aspirin  for antiplatelet therapy, carvedilol , and a high intensity statin drug.  I would like to see him back in 1 year.  He is now 12 years out from his acute MI and his cardiac catheterization last year showed nonobstructive CAD with reassuring results. Essential hypertension Blood pressure is under optimal control on low-dose carvedilol . Other hyperlipidemia Treated with rosuvastatin  and Vascepa .  Review of recent lipids shows excellent readings with a cholesterol of 119, LDL 52, and triglycerides 830.     Medication Adjustments/Labs and Tests Ordered: Current medicines are reviewed at length with the patient today.  Concerns regarding medicines are outlined above.  Orders Placed This Encounter  Procedures   EKG 12-Lead   Meds ordered this encounter  Medications   nitroGLYCERIN  (NITROSTAT ) 0.4 MG SL tablet    Sig: DISSOLVE ONE TABLET UNDER THE TONGUE EVERY 5 MINUTES AS NEEDED FOR CHEST PAIN.  DO NOT EXCEED A TOTAL OF 3 DOSES IN 15 MINUTES    Dispense:  25 tablet    Refill:  3    Please call  our office to schedule an overdue appointment with Dr. Alveta before anymore refills. [phone]. Thank you 1st attempt    Patient Instructions  Medication Instructions:   No  changes  *If you need a refill on your cardiac medications before your next appointment, please call your pharmacy*   Lab Work: Not needed    Testing/Procedures:  Not needed  Follow-Up: At [facility], you and your health needs are our priority.  As part of our continuing mission to provide you with exceptional heart care, we have created designated Provider Care Teams.  These Care Teams include your primary Cardiologist (physician) and Advanced Practice Providers (APPs -  Physician Assistants and Nurse Practitioners) who all work together to provide you with the care you need, when you need it.     Your next appointment:   12 month(s)  The format for your next appointment:   In Person  Provider:   Ozell Fell, MD      Signed, Curtis Fell, MD  10/02/2024 12:54 PM    [facility]

## 2024-10-02 NOTE — Patient Instructions (Signed)
 Medication Instructions:   No changes  *If you need a refill on your cardiac medications before your next appointment, please call your pharmacy*   Lab Work: Not needed    Testing/Procedures:  Not needed  Follow-Up: At [facility], you and your health needs are our priority.  As part of our continuing mission to provide you with exceptional heart care, we have created designated Provider Care Teams.  These Care Teams include your primary Cardiologist (physician) and Advanced Practice Providers (APPs -  Physician Assistants and Nurse Practitioners) who all work together to provide you with the care you need, when you need it.     Your next appointment:   12 month(s)  The format for your next appointment:   In Person  Provider:   Ozell Fell, MD

## 2024-10-17 ENCOUNTER — Other Ambulatory Visit: Payer: Self-pay | Admitting: Cardiovascular Disease

## 2024-11-04 ENCOUNTER — Ambulatory Visit: Payer: Self-pay

## 2024-11-04 ENCOUNTER — Ambulatory Visit
Admission: RE | Admit: 2024-11-04 | Discharge: 2024-11-04 | Disposition: A | Discharge status: Home / Self Care | Payer: Self-pay | Source: Ambulatory Visit | Attending: Family Medicine | Admitting: Family Medicine

## 2024-11-04 VITALS — BP 112/75 | HR 93 | Temp 99.7°F | Resp 16 | Ht 71.0 in | Wt 203.8 lb

## 2024-11-04 DIAGNOSIS — R051 Acute cough: Secondary | ICD-10-CM

## 2024-11-04 DIAGNOSIS — R918 Other nonspecific abnormal finding of lung field: Secondary | ICD-10-CM

## 2024-11-04 MED ORDER — AMOXICILLIN-POT CLAVULANATE 875-125 MG PO TABS: 1.0000 | ORAL_TABLET | Freq: Two times a day (BID) | ORAL | 0 refills | Status: DC

## 2024-11-04 MED ORDER — AZITHROMYCIN 250 MG PO TABS: 250.0000 mg | ORAL_TABLET | Freq: Every day | ORAL | 0 refills | Status: DC

## 2024-11-04 MED ORDER — PREDNISONE 50 MG PO TABS: 50.0000 mg | ORAL_TABLET | Freq: Every day | ORAL | 0 refills | Status: AC

## 2024-11-04 MED ORDER — PROMETHAZINE-DM 6.25-15 MG/5ML PO SYRP: 5.0000 mL | ORAL_SOLUTION | Freq: Four times a day (QID) | ORAL | 0 refills | Status: DC | PRN

## 2024-11-04 NOTE — Discharge Instructions (Signed)
 Call PCP office. Needs appt. Needs chest CT.  If he worsens, go directly to the hospital.

## 2024-11-04 NOTE — ED Provider Notes (Signed)
 " [facility]    CSN: [id number] Arrival date & time: 11/04/24  1113      History   Chief Complaint Chief Complaint  Patient presents with   Cough    Appointment    HPI 64 year old male presents for evaluation of cough.  2-week history of cough.  Initially had fever which started on 12/22.  Lasted for 4 days.  Tmax 101.9.  Cough has worsened.  Associated shortness of breath.  Patient has had postinflammatory/postinfectious changes from prior bout of COVID-19 for which she was hospitalized.  No relieving factors.  No other complaints.  Past Medical History:  Diagnosis Date   Ascending aorta dilatation    Echo 4/22: 39 mm // Chest CT 2/22: nonaneurysmal aorta   CAD (coronary artery disease)    a. 07/2012 Anterolateral MI/Cath: LAD 100 100%-> 3.0x16 Promus Element DES, otw nonobs dzs, EF 30%   Cardiac arrest (HCC)    DM2 (diabetes mellitus, type 2) (HCC)    History of COVID-19    prolonged hospitalization c/b pneumonia, pneumomediastinum   HLD (hyperlipidemia)    HTN (hypertension)    Ischemic cardiomyopathy    a. 07/2012 Echo: EF 25-30% w/ mid ant, basal inf, sept Sev HK & apical, dist post, mid/dist inf, dist ant, dist lat AK. Gr 1 DD, mild MR. // Echo 3/14: EF 50-55 // Echo 4/22: EF 50-55, anteroseptal and inferoseptal HK, moderate asymmetric LVH, GR 1 DD, normal RVSF, trivial MR, mild dilation of ascending aorta (39 mm)    MI (myocardial infarction) (HCC)    VF (ventricular fibrillation) (HCC)    a. in setting of MI 07/2012    Patient Active Problem List   Diagnosis Date Noted   Near syncope 12/27/2020   Hypertriglyceridemia 02/25/2015   Pure hypercholesterolemia 09/14/2014   Acute MI, anterolateral wall, initial episode of care (HCC) 07/12/2012   Cardiomyopathy, ischemic 07/12/2012   Coronary artery disease involving native coronary artery with angina pectoris 07/12/2012   Ventricular fibrillation (HCC) 07/08/2012    Past Surgical History:  Procedure  Laterality Date   APPENDECTOMY     CARDIAC CATHETERIZATION     COLONOSCOPY N/A 06/12/2024   Procedure: COLONOSCOPY;  Surgeon: Onita Elspeth Sharper, DO;  Location: [facility] ENDOSCOPY;  Service: Gastroenterology;  Laterality: N/A;   ESOPHAGOGASTRODUODENOSCOPY N/A 06/12/2024   Procedure: EGD (ESOPHAGOGASTRODUODENOSCOPY);  Surgeon: Onita Elspeth Sharper, DO;  Location: [facility] ENDOSCOPY;  Service: Gastroenterology;  Laterality: N/A;   LEFT HEART CATH AND CORONARY ANGIOGRAPHY N/A 12/04/2021   Procedure: LEFT HEART CATH AND CORONARY ANGIOGRAPHY;  Surgeon: Wonda Sharper, MD;  Location: [facility] INVASIVE CV LAB;  Service: Cardiovascular;  Laterality: N/A;   LEFT HEART CATHETERIZATION WITH CORONARY ANGIOGRAM N/A 07/08/2012   Procedure: LEFT HEART CATHETERIZATION WITH CORONARY ANGIOGRAM;  Surgeon: Sharper Wonda, MD;  Location: [facility] CATH LAB;  Service: Cardiovascular;  Laterality: N/A;   PERCUTANEOUS CORONARY STENT INTERVENTION (PCI-S)  07/08/2012   Procedure: PERCUTANEOUS CORONARY STENT INTERVENTION (PCI-S);  Surgeon: Sharper Wonda, MD;  Location: [facility] CATH LAB;  Service: Cardiovascular;;   POLYPECTOMY  06/12/2024   Procedure: POLYPECTOMY, INTESTINE;  Surgeon: Onita Elspeth Sharper, DO;  Location: [facility] ENDOSCOPY;  Service: Gastroenterology;;       Home Medications    Prior to Admission medications  Medication Sig Start Date End Date Taking? Authorizing Provider  amoxicillin -clavulanate (AUGMENTIN ) 875-125 MG tablet Take 1 tablet by mouth every 12 (twelve) hours. 11/04/24  Yes Garvin Ellena G, DO  azithromycin  (ZITHROMAX ) 250 MG tablet Take 1 tablet (250 mg total)  by mouth daily. Take first 2 tablets together, then 1 every day until finished. 11/04/24  Yes Sonya Pucci G, DO  predniSONE  (DELTASONE ) 50 MG tablet Take 1 tablet (50 mg total) by mouth daily for 5 days. 11/04/24 11/09/24 Yes Tephanie Escorcia G, DO  promethazine -dextromethorphan  (PROMETHAZINE -DM) 6.25-15 MG/5ML syrup Take 5 mLs by mouth 4 (four) times daily as needed. 11/04/24   Yes Lydia Meng G, DO  acetaminophen  (TYLENOL ) 500 MG tablet Take 1,000 mg by mouth every 6 (six) hours as needed for mild pain.    [provider]  albuterol  (VENTOLIN  HFA) 108 (90 Base) MCG/ACT inhaler Inhale 2 puffs into the lungs every 4 (four) hours as needed for shortness of breath or wheezing. 12/15/20   Perri DELENA Meliton Mickey., MD  aspirin  EC 81 MG tablet Take 1 tablet (81 mg total) by mouth daily. 07/20/13   Wonda Sharper, MD  carvedilol  (COREG ) 3.125 MG tablet TAKE 1 TABLET BY MOUTH 2 TIMES DAILY WITH A MEAL 10/17/24   Wonda Sharper, MD  icosapent  Ethyl (VASCEPA ) 1 g capsule Take 2 capsules (2 g total) by mouth 2 (two) times daily. 06/08/24   Wonda Sharper, MD  Multiple Vitamin (MULTIVITAMIN WITH MINERALS) TABS tablet Take 1 tablet by mouth daily.    [provider]  nitroGLYCERIN  (NITROSTAT ) 0.4 MG SL tablet DISSOLVE ONE TABLET UNDER THE TONGUE EVERY 5 MINUTES AS NEEDED FOR CHEST PAIN.  DO NOT EXCEED A TOTAL OF 3 DOSES IN 15 MINUTES 10/02/24   Wonda Sharper, MD  omeprazole  (PRILOSEC  OTC) 20 MG tablet Take 20 mg by mouth daily.    [provider]  rosuvastatin  (CRESTOR ) 20 MG tablet Take 1 tablet (20 mg total) by mouth daily. Please keep upcoming appointment for any future refills Thank you 06/09/24   Wonda Sharper, MD  fenofibrate  160 MG tablet Take 1 tablet (160 mg total) by mouth daily. 02/28/20 11/18/20  Wonda Sharper, MD    Family History Family History  Problem Relation Age of Onset   Heart attack Father 23   Heart attack Sister 38   Heart attack Other 22    Social History Social History[1]   Allergies   Ibuprofen   Review of Systems Review of Systems  Constitutional:  Positive for fever.  Respiratory:  Positive for cough and shortness of breath.    Physical Exam Triage Vital Signs ED Triage Vitals  Encounter Vitals Group     BP 11/04/24 1136 112/75     Girls Systolic BP Percentile --      Girls Diastolic BP Percentile --      Boys  Systolic BP Percentile --      Boys Diastolic BP Percentile --      Pulse Rate 11/04/24 1136 93     Resp 11/04/24 1136 16     Temp 11/04/24 1136 99.7 F (37.6 C)     Temp Source 11/04/24 1136 Oral     SpO2 11/04/24 1136 98 %     Weight 11/04/24 1133 203 lb 12.8 oz (92.4 kg)     Height 11/04/24 1133 5' 11 (1.803 m)     Head Circumference --      Peak Flow --      Pain Score 11/04/24 1133 0     Pain Loc --      Pain Education --      Exclude from Growth Chart --    No data found.  Updated Vital Signs BP 112/75 (BP Location: Left Arm)  Pulse 93   Temp 99.7 F (37.6 C) (Oral)   Resp 16   Ht 5' 11 (1.803 m)   Wt 92.4 kg   SpO2 98%   BMI 28.42 kg/m   Visual Acuity Right Eye Distance:   Left Eye Distance:   Bilateral Distance:    Right Eye Near:   Left Eye Near:    Bilateral Near:     Physical Exam Vitals and nursing note reviewed.  Constitutional:      General: He is not in acute distress.    Appearance: Normal appearance.  HENT:     Head: Normocephalic and atraumatic.  Cardiovascular:     Rate and Rhythm: Normal rate and regular rhythm.  Pulmonary:     Effort: Pulmonary effort is normal.     Breath sounds: Normal breath sounds. No wheezing, rhonchi or rales.  Neurological:     Mental Status: He is alert.      UC Treatments / Results  Labs (all labs ordered are listed, but only abnormal results are displayed) Labs Reviewed - No data to display  EKG   Radiology DG Chest 2 View Result Date: 11/04/2024 CLINICAL DATA:  Cough and fever for 1 week EXAM: CHEST - 2 VIEW COMPARISON:  January 24, 2021.  December 27, 2020. FINDINGS: The heart size and mediastinal contours are within normal limits. Stable bilateral perihilar scarring is noted. However, increased ill-defined density is noted in right upper lobe laterally concerning for possible mass or neoplasm. CT scan of the chest is recommended for further evaluation. The visualized skeletal structures are  unremarkable. IMPRESSION: Increased ill-defined density seen in right upper lobe laterally concerning for possible mass or neoplasm. CT scan of the chest is recommended for further evaluation. Electronically Signed   By: Lynwood Landy Raddle M.D.   On: 11/04/2024 12:03    Procedures Procedures (including critical care time)  Medications Ordered in UC Medications - No data to display  Initial Impression / Assessment and Plan / UC Course  I have reviewed the triage vital signs and the nursing notes.  Pertinent labs & imaging results that were available during my care of the patient were reviewed by me and considered in my medical decision making (see chart for details).    64 year old male presents for evaluation of fever and cough.  Chest x-ray was done and revealed an ill-defined density/opacity.  Concern for neoplasm.  Needs the CT chest.  Concern for infectious etiology.  Covering with Augmentin  and azithromycin .  Promethazine  DM for cough.  Also placing on corticosteroids.  Advised that he needs to see his PCP this week.  Needs urgent CT chest.  Final Clinical Impressions(s) / UC Diagnoses   Final diagnoses:  Opacity of lung on imaging study  Acute cough     Discharge Instructions      Call PCP office. Needs appt. Needs chest CT.  If he worsens, go directly to the hospital.    ED Prescriptions     Medication Sig Dispense Auth. Provider   promethazine -dextromethorphan  (PROMETHAZINE -DM) 6.25-15 MG/5ML syrup Take 5 mLs by mouth 4 (four) times daily as needed. 118 mL Ebone Alcivar G, DO   predniSONE  (DELTASONE ) 50 MG tablet Take 1 tablet (50 mg total) by mouth daily for 5 days. 5 tablet [place], Kahlia Lagunes G, DO   amoxicillin -clavulanate (AUGMENTIN ) 875-125 MG tablet Take 1 tablet by mouth every 12 (twelve) hours. 14 tablet Buzz Axel G, DO   azithromycin  (ZITHROMAX ) 250 MG tablet Take 1 tablet (250 mg  total) by mouth daily. Take first 2 tablets together, then 1 every day until finished. 6  tablet Arietta Eisenstein G, DO      PDMP not reviewed this encounter.    [1]  Social History Tobacco Use   Smoking status: Never   Smokeless tobacco: Never  Vaping Use   Vaping status: Never Used  Substance Use Topics   Alcohol use: No   Drug use: No     Printice Hellmer G, DO 11/04/24 1238  "

## 2024-11-04 NOTE — ED Triage Notes (Signed)
 Patient c/o cough and chest congestion, and fever that started over a week.  Patient states that his cough has gotten worse.

## 2024-11-08 ENCOUNTER — Other Ambulatory Visit: Payer: Self-pay | Admitting: Family Medicine

## 2024-11-08 DIAGNOSIS — R9389 Abnormal findings on diagnostic imaging of other specified body structures: Secondary | ICD-10-CM

## 2024-11-14 ENCOUNTER — Inpatient Hospital Stay
Admission: RE | Admit: 2024-11-14 | Discharge: 2024-11-14 | Disposition: A | Payer: Self-pay | Source: Ambulatory Visit | Attending: Family Medicine | Admitting: Family Medicine

## 2024-11-14 DIAGNOSIS — R9389 Abnormal findings on diagnostic imaging of other specified body structures: Secondary | ICD-10-CM

## 2024-11-14 MED ORDER — IOPAMIDOL (ISOVUE-300) INJECTION 61%
75.0000 mL | Freq: Once | INTRAVENOUS | Status: AC | PRN
  Administered 2024-11-14: 75 mL via INTRAVENOUS

## 2024-11-24 ENCOUNTER — Encounter: Payer: Self-pay | Admitting: Pulmonary Disease

## 2024-11-24 ENCOUNTER — Ambulatory Visit: Payer: Self-pay | Admitting: Pulmonary Disease

## 2024-11-24 VITALS — BP 116/78 | HR 76 | Temp 97.7°F | Ht 71.0 in | Wt 211.0 lb

## 2024-11-24 DIAGNOSIS — Z8701 Personal history of pneumonia (recurrent): Secondary | ICD-10-CM

## 2024-11-24 DIAGNOSIS — U099 Post covid-19 condition, unspecified: Secondary | ICD-10-CM

## 2024-11-24 DIAGNOSIS — J8410 Pulmonary fibrosis, unspecified: Secondary | ICD-10-CM

## 2024-11-24 DIAGNOSIS — R918 Other nonspecific abnormal finding of lung field: Secondary | ICD-10-CM

## 2024-11-24 DIAGNOSIS — J189 Pneumonia, unspecified organism: Secondary | ICD-10-CM

## 2024-11-24 NOTE — Progress Notes (Unsigned)
 "  Subjective:    Patient ID: Curtis Rosales, male    DOB: Feb 06, 1961, 64 y.o.   MRN: [id number]  Patient Care Team: Jeffie Cheryl BRAVO, MD as PCP - General (Family Medicine) Wonda Sharper, MD as PCP - Cardiology (Cardiology)  Chief Complaint  Patient presents with   Medical Management of Chronic Issues    Cough, only in the morning.     BACKGROUND:   HPI   DATA 12/27/2020 CT angio chest: No evidence of pulmonary embolism, subcentimeter mediastinal hilar nodes, no pneumothorax or pleural effusion widespread coarse bandlike densities with groundglass density felt most suggestive of postinfectious/postinflammatory scarring 06/05/2021 PFTs: FEV1 3.51 L or 92% predicted, FVC 3.84 L or 77% predicted, FEV1/FVC 91%, no bronchodilator response, mild restriction diffusion capacity moderately reduced 09/30/2022 sleep study: AHI 5.3 events per hour P O2 nadir 86%, no oxygen use during the testing Review of Systems A 10 point review of systems was performed and it is as noted above otherwise negative.   Past Medical History:  Diagnosis Date   Ascending aorta dilatation    Echo 4/22: 39 mm // Chest CT 2/22: nonaneurysmal aorta   CAD (coronary artery disease)    a. 07/2012 Anterolateral MI/Cath: LAD 100 100%-> 3.0x16 Promus Element DES, otw nonobs dzs, EF 30%   Cardiac arrest (HCC)    DM2 (diabetes mellitus, type 2) (HCC)    History of COVID-19    prolonged hospitalization c/b pneumonia, pneumomediastinum   HLD (hyperlipidemia)    HTN (hypertension)    Ischemic cardiomyopathy    a. 07/2012 Echo: EF 25-30% w/ mid ant, basal inf, sept Sev HK & apical, dist post, mid/dist inf, dist ant, dist lat AK. Gr 1 DD, mild MR. // Echo 3/14: EF 50-55 // Echo 4/22: EF 50-55, anteroseptal and inferoseptal HK, moderate asymmetric LVH, GR 1 DD, normal RVSF, trivial MR, mild dilation of ascending aorta (39 mm)    MI (myocardial infarction) (HCC)    VF (ventricular fibrillation) (HCC)    a. in setting of MI  07/2012    Past Surgical History:  Procedure Laterality Date   APPENDECTOMY     CARDIAC CATHETERIZATION     COLONOSCOPY N/A 06/12/2024   Procedure: COLONOSCOPY;  Surgeon: Onita Elspeth Sharper, DO;  Location: [facility] ENDOSCOPY;  Service: Gastroenterology;  Laterality: N/A;   ESOPHAGOGASTRODUODENOSCOPY N/A 06/12/2024   Procedure: EGD (ESOPHAGOGASTRODUODENOSCOPY);  Surgeon: Onita Elspeth Sharper, DO;  Location: [facility] ENDOSCOPY;  Service: Gastroenterology;  Laterality: N/A;   LEFT HEART CATH AND CORONARY ANGIOGRAPHY N/A 12/04/2021   Procedure: LEFT HEART CATH AND CORONARY ANGIOGRAPHY;  Surgeon: Wonda Sharper, MD;  Location: [facility] INVASIVE CV LAB;  Service: Cardiovascular;  Laterality: N/A;   LEFT HEART CATHETERIZATION WITH CORONARY ANGIOGRAM N/A 07/08/2012   Procedure: LEFT HEART CATHETERIZATION WITH CORONARY ANGIOGRAM;  Surgeon: Sharper Wonda, MD;  Location: [facility] CATH LAB;  Service: Cardiovascular;  Laterality: N/A;   PERCUTANEOUS CORONARY STENT INTERVENTION (PCI-S)  07/08/2012   Procedure: PERCUTANEOUS CORONARY STENT INTERVENTION (PCI-S);  Surgeon: Sharper Wonda, MD;  Location: [facility] CATH LAB;  Service: Cardiovascular;;   POLYPECTOMY  06/12/2024   Procedure: POLYPECTOMY, INTESTINE;  Surgeon: Onita Elspeth Sharper, DO;  Location: [facility] ENDOSCOPY;  Service: Gastroenterology;;    Patient Active Problem List   Diagnosis Date Noted   Near syncope 12/27/2020   Hypertriglyceridemia 02/25/2015   Pure hypercholesterolemia 09/14/2014   Acute MI, anterolateral wall, initial episode of care [facility]) 07/12/2012   Cardiomyopathy, ischemic 07/12/2012   Coronary artery disease involving native coronary artery with angina  pectoris 07/12/2012   Ventricular fibrillation (HCC) 07/08/2012    Family History  Problem Relation Age of Onset   Heart attack Father 31   Heart attack Sister 63   Heart attack Other 89    Social History   Tobacco Use   Smoking status: Never   Smokeless tobacco: Never  Substance Use Topics    Alcohol use: No    Allergies[1]  Active Medications[2]  Immunization History  Administered Date(s) Administered   Influenza,inj,Quad PF,6+ Mos 11/22/2012        Objective:     Vitals:   11/24/24 1101  BP: 116/78  Pulse: 76  Temp: 97.7 F (36.5 C)  Height: 5' 11 (1.803 m)  Weight: 211 lb (95.7 kg)  SpO2: 97%  TempSrc: Temporal  BMI (Calculated): 29.44     SpO2: 97 %  GENERAL: Overweight gentleman, no acute distress.  Fully ambulatory.  No conversational dyspnea. HEAD: Normocephalic, atraumatic.  EYES: Pupils equal, round, reactive to light.  No scleral icterus.  MOUTH: Very poor dentition overall with missing teeth, chipped.  Oral mucosa moist.   NECK: Supple. No thyromegaly. Trachea midline. No JVD.  No adenopathy. PULMONARY: Good air entry bilaterally.  No adventitious sounds. CARDIOVASCULAR: S1 and S2. Regular rate and rhythm.  No rubs, murmurs or gallops heard. ABDOMEN: Obese, otherwise benign. MUSCULOSKELETAL: No joint deformity, no clubbing, no edema.  NEUROLOGIC: No focal deficit, no gait disturbance, speech is fluent. SKIN: Intact,warm,dry.  On limited exam no rashes. PSYCH: Mood and behavior normal.         Assessment & Plan:     ICD-10-CM   1. Abnormal findings on diagnostic imaging of lung  R91.8 CT CHEST WO CONTRAST    2. 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 History of recent pneumonia  Z87.01       Orders Placed This Encounter  Procedures   CT CHEST WO CONTRAST    Standing Status:   Future    Expected Date:   12/18/2024    Expiration Date:   11/24/2025    Preferred imaging location?:   [facility]    No orders of the defined types were placed in this encounter.    Advised if symptoms do not improve or worsen, to please contact office for sooner follow up or seek emergency care.    I spent xxx minutes of dedicated to the care of this patient on the date of this encounter to include  pre-visit review of records, face-to-face time with the patient discussing conditions above, post visit ordering of testing, clinical documentation with the electronic health record, making appropriate referrals as documented, and communicating necessary findings to members of the patients care team.   C. Leita Sanders, MD Advanced Bronchoscopy PCCM [facility]-    *This note was dictated using voice recognition software/Dragon.  Despite best efforts to proofread, errors can occur which can change the meaning. Any transcriptional errors that result from this process are unintentional and may not be fully corrected at the time of dictation.    [1]  Allergies Allergen Reactions   Ibuprofen Other (See Comments)    Heart issue  [2]  Current Meds  Medication Sig   acetaminophen  (TYLENOL ) 500 MG tablet Take 1,000 mg by mouth every 6 (six) hours as needed for mild pain.   albuterol  (VENTOLIN  HFA) 108 (90 Base) MCG/ACT inhaler Inhale 2 puffs into the lungs every 4 (four) hours as needed for shortness of breath  or wheezing.   aspirin  EC 81 MG tablet Take 1 tablet (81 mg total) by mouth daily.   carvedilol  (COREG ) 3.125 MG tablet TAKE 1 TABLET BY MOUTH 2 TIMES DAILY WITH A MEAL   icosapent  Ethyl (VASCEPA ) 1 g capsule Take 2 capsules (2 g total) by mouth 2 (two) times daily.   Multiple Vitamin (MULTIVITAMIN WITH MINERALS) TABS tablet Take 1 tablet by mouth daily.   nitroGLYCERIN  (NITROSTAT ) 0.4 MG SL tablet DISSOLVE ONE TABLET UNDER THE TONGUE EVERY 5 MINUTES AS NEEDED FOR CHEST PAIN.  DO NOT EXCEED A TOTAL OF 3 DOSES IN 15 MINUTES   omeprazole  (PRILOSEC  OTC) 20 MG tablet Take 20 mg by mouth daily.   rosuvastatin  (CRESTOR ) 20 MG tablet Take 1 tablet (20 mg total) by mouth daily. Please keep upcoming appointment for any future refills Thank you   "

## 2024-11-24 NOTE — Patient Instructions (Signed)
 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 Your symptoms, including shortness of breath and cough, have improved significantly since the initial onset.  YOUR PLAN:  -POST-COVID-19 PULMONARY FIBROSIS: This condition involves scarring of the lung tissue following a COVID-19 infection. Your recent imaging shows mild fibrosis and inflammation, consistent with previous findings. We will continue to monitor for resolution of fibrosis and inflammation.  -PNEUMONIA: Pneumonia is an infection that inflames the air sacs in one or both lungs. Your recent chest x-ray and CT scan showed areas of inflammation, but your symptoms have improved significantly. We will do a follow-up chest CT in two weeks to assess the resolution of pneumonia and consider a biopsy if there is no improvement.  INSTRUCTIONS:  Please return for a follow-up chest CT in 2-3 weeks to assess the resolution of pneumonia. If there is no improvement, we may consider a biopsy.

## 2024-11-28 ENCOUNTER — Encounter: Payer: Self-pay | Admitting: Pulmonary Disease

## 2024-12-08 ENCOUNTER — Ambulatory Visit: Admission: RE | Admit: 2024-12-08 | Source: Ambulatory Visit

## 2024-12-08 DIAGNOSIS — J8410 Pulmonary fibrosis, unspecified: Secondary | ICD-10-CM

## 2024-12-08 DIAGNOSIS — R918 Other nonspecific abnormal finding of lung field: Secondary | ICD-10-CM

## 2024-12-22 ENCOUNTER — Ambulatory Visit: Admitting: Pulmonary Disease
